# Patient Record
Sex: Female | Born: 1950 | Race: Black or African American | Hispanic: No | Smoking: Never smoker
Health system: Southern US, Community
[De-identification: ages and names within clinical notes are randomized; demographics above are authoritative.]

## PROBLEM LIST (undated history)

## (undated) DIAGNOSIS — I10 Essential (primary) hypertension: Secondary | ICD-10-CM

## (undated) DIAGNOSIS — C50919 Malignant neoplasm of unspecified site of unspecified female breast: Secondary | ICD-10-CM

## (undated) DIAGNOSIS — M25561 Pain in right knee: Secondary | ICD-10-CM

## (undated) DIAGNOSIS — M1990 Unspecified osteoarthritis, unspecified site: Secondary | ICD-10-CM

## (undated) DIAGNOSIS — E039 Hypothyroidism, unspecified: Secondary | ICD-10-CM

## (undated) DIAGNOSIS — R7303 Prediabetes: Secondary | ICD-10-CM

## (undated) HISTORY — PX: TONSILLECTOMY: SUR1361

---

## 1998-03-18 ENCOUNTER — Ambulatory Visit (HOSPITAL_COMMUNITY): Admission: RE | Admit: 1998-03-18 | Discharge: 1998-03-18 | Payer: Self-pay | Admitting: Family Medicine

## 2000-09-26 ENCOUNTER — Encounter: Payer: Self-pay | Admitting: Family Medicine

## 2000-09-26 ENCOUNTER — Ambulatory Visit (HOSPITAL_COMMUNITY): Admission: RE | Admit: 2000-09-26 | Discharge: 2000-09-26 | Payer: Self-pay | Admitting: Family Medicine

## 2001-01-12 ENCOUNTER — Ambulatory Visit (HOSPITAL_COMMUNITY): Admission: RE | Admit: 2001-01-12 | Discharge: 2001-01-12 | Payer: Self-pay | Admitting: *Deleted

## 2001-07-31 ENCOUNTER — Ambulatory Visit (HOSPITAL_COMMUNITY): Admission: RE | Admit: 2001-07-31 | Discharge: 2001-07-31 | Payer: Self-pay | Admitting: Family Medicine

## 2001-07-31 ENCOUNTER — Encounter: Payer: Self-pay | Admitting: Family Medicine

## 2002-01-18 ENCOUNTER — Ambulatory Visit (HOSPITAL_COMMUNITY): Admission: RE | Admit: 2002-01-18 | Discharge: 2002-01-18 | Payer: Self-pay | Admitting: Family Medicine

## 2002-01-25 ENCOUNTER — Ambulatory Visit (HOSPITAL_COMMUNITY): Admission: RE | Admit: 2002-01-25 | Discharge: 2002-01-25 | Payer: Self-pay | Admitting: Family Medicine

## 2002-01-25 ENCOUNTER — Encounter: Payer: Self-pay | Admitting: Family Medicine

## 2002-04-10 ENCOUNTER — Other Ambulatory Visit: Admission: RE | Admit: 2002-04-10 | Discharge: 2002-04-10 | Payer: Self-pay | Admitting: Obstetrics & Gynecology

## 2002-04-26 ENCOUNTER — Ambulatory Visit (HOSPITAL_COMMUNITY): Admission: RE | Admit: 2002-04-26 | Discharge: 2002-04-26 | Payer: Self-pay | Admitting: Family Medicine

## 2002-04-26 ENCOUNTER — Encounter: Payer: Self-pay | Admitting: Family Medicine

## 2003-04-22 ENCOUNTER — Other Ambulatory Visit: Admission: RE | Admit: 2003-04-22 | Discharge: 2003-04-22 | Payer: Self-pay | Admitting: Obstetrics & Gynecology

## 2003-05-04 ENCOUNTER — Emergency Department (HOSPITAL_COMMUNITY): Admission: EM | Admit: 2003-05-04 | Discharge: 2003-05-04 | Payer: Self-pay | Admitting: Emergency Medicine

## 2003-05-04 ENCOUNTER — Encounter: Payer: Self-pay | Admitting: Emergency Medicine

## 2004-01-15 ENCOUNTER — Emergency Department (HOSPITAL_COMMUNITY): Admission: EM | Admit: 2004-01-15 | Discharge: 2004-01-15 | Payer: Self-pay | Admitting: Emergency Medicine

## 2004-08-24 ENCOUNTER — Other Ambulatory Visit: Admission: RE | Admit: 2004-08-24 | Discharge: 2004-08-24 | Payer: Self-pay | Admitting: Obstetrics & Gynecology

## 2005-08-24 ENCOUNTER — Other Ambulatory Visit: Admission: RE | Admit: 2005-08-24 | Discharge: 2005-08-24 | Payer: Self-pay | Admitting: Obstetrics & Gynecology

## 2005-10-13 ENCOUNTER — Encounter: Admission: RE | Admit: 2005-10-13 | Discharge: 2005-10-13 | Payer: Self-pay | Admitting: Cardiology

## 2005-10-31 ENCOUNTER — Ambulatory Visit: Payer: Self-pay | Admitting: Internal Medicine

## 2005-11-14 ENCOUNTER — Ambulatory Visit: Payer: Self-pay | Admitting: Internal Medicine

## 2005-11-28 ENCOUNTER — Encounter: Admission: RE | Admit: 2005-11-28 | Discharge: 2005-11-28 | Payer: Self-pay | Admitting: Cardiology

## 2005-12-13 ENCOUNTER — Ambulatory Visit: Payer: Self-pay | Admitting: Internal Medicine

## 2006-01-03 ENCOUNTER — Ambulatory Visit: Payer: Self-pay | Admitting: Internal Medicine

## 2006-02-14 ENCOUNTER — Ambulatory Visit: Payer: Self-pay | Admitting: Internal Medicine

## 2008-02-25 ENCOUNTER — Encounter (INDEPENDENT_AMBULATORY_CARE_PROVIDER_SITE_OTHER): Payer: Self-pay | Admitting: *Deleted

## 2008-12-19 ENCOUNTER — Encounter: Admission: RE | Admit: 2008-12-19 | Discharge: 2008-12-19 | Payer: Self-pay | Admitting: Family Medicine

## 2009-01-07 ENCOUNTER — Ambulatory Visit (HOSPITAL_BASED_OUTPATIENT_CLINIC_OR_DEPARTMENT_OTHER): Admission: RE | Admit: 2009-01-07 | Discharge: 2009-01-07 | Payer: Self-pay | Admitting: Orthopedic Surgery

## 2009-11-10 ENCOUNTER — Encounter: Admission: RE | Admit: 2009-11-10 | Discharge: 2009-11-10 | Payer: Self-pay | Admitting: Cardiology

## 2009-11-12 ENCOUNTER — Encounter: Admission: RE | Admit: 2009-11-12 | Discharge: 2009-11-12 | Payer: Self-pay | Admitting: Cardiology

## 2010-01-22 IMAGING — US US RENAL
1 series · 14 of 25 positions shown · non-contrast
Comparison: None

CLINICAL DATA: Right flank and back pain

RENAL/URINARY TRACT ULTRASOUND COMPLETE

[Series 1: us renal · 0.30mm/px · 14 of 33 slices shown]
[im 1/33]
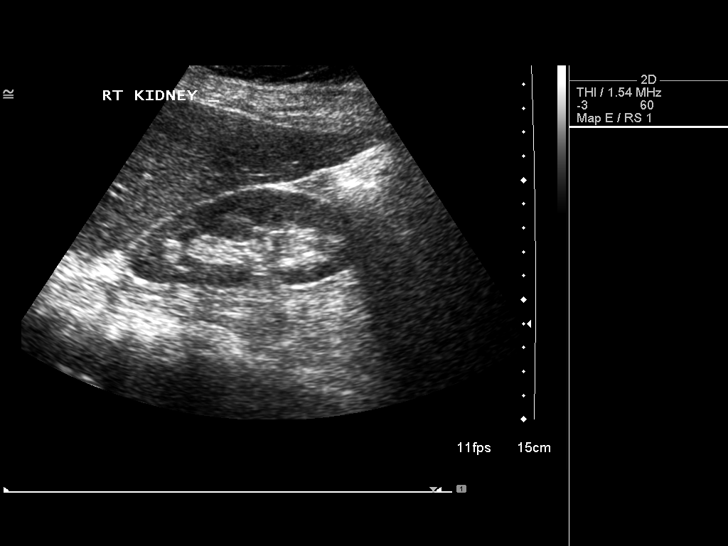
[im 3/33]
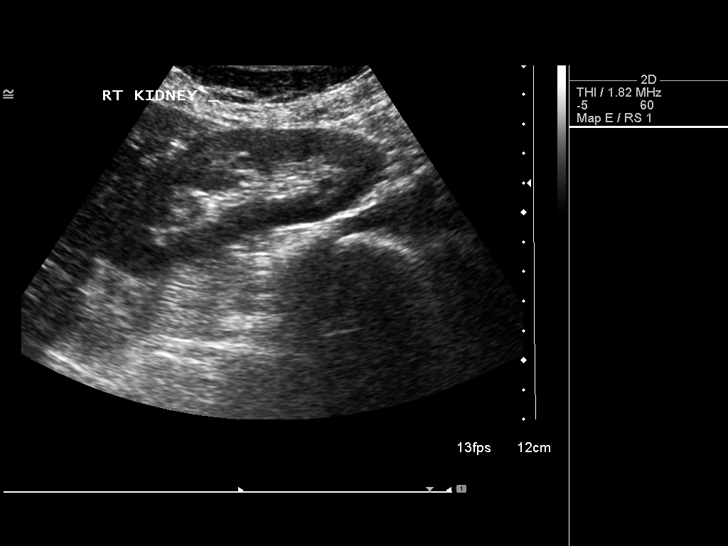
[im 6/33]
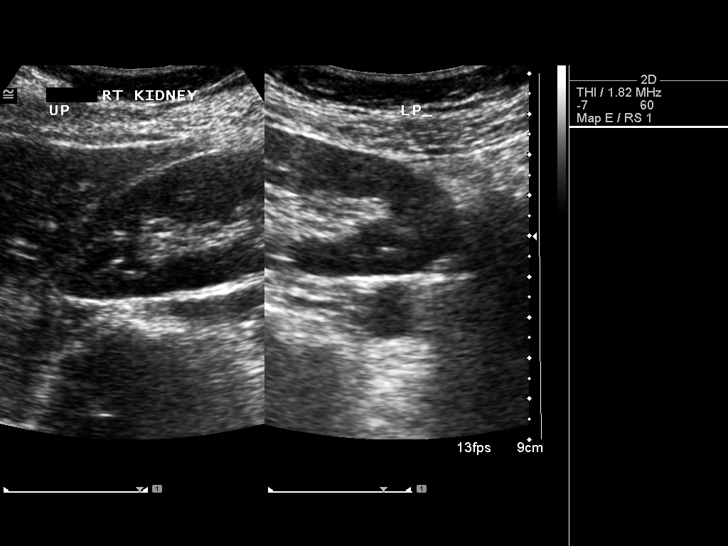
[im 9/33]
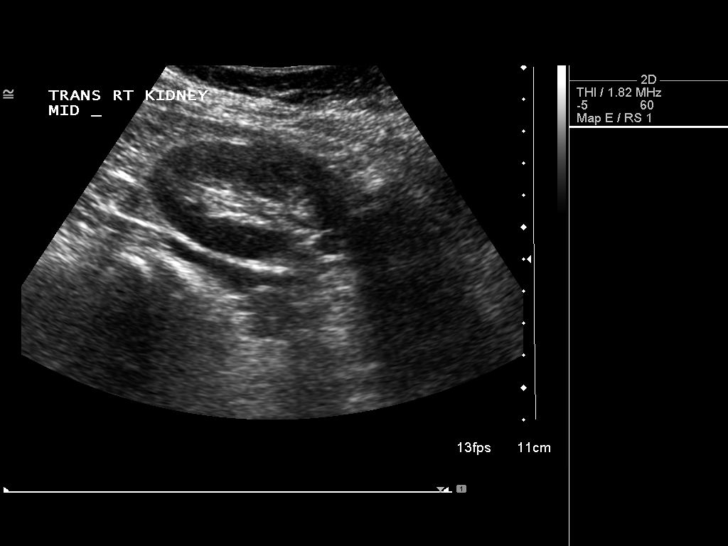
[im 11/33]
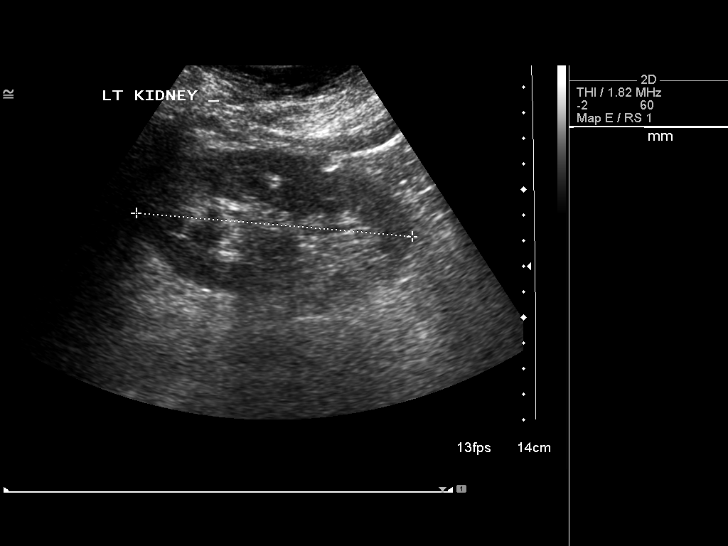
[im 13/33]
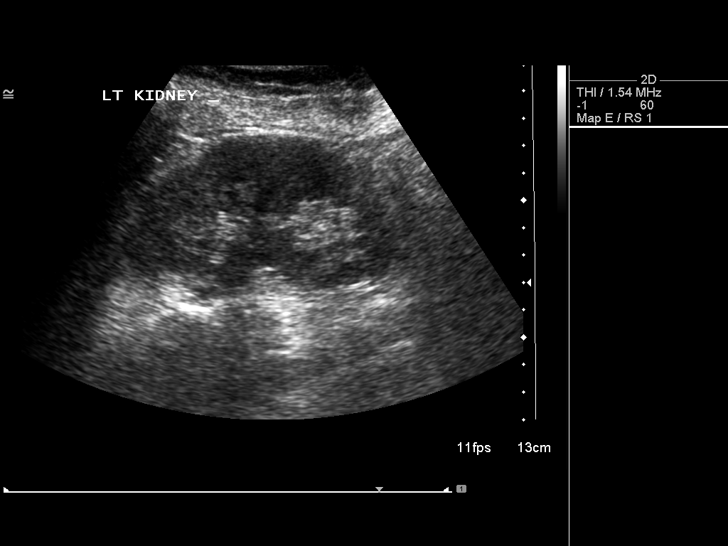
[im 15/33]
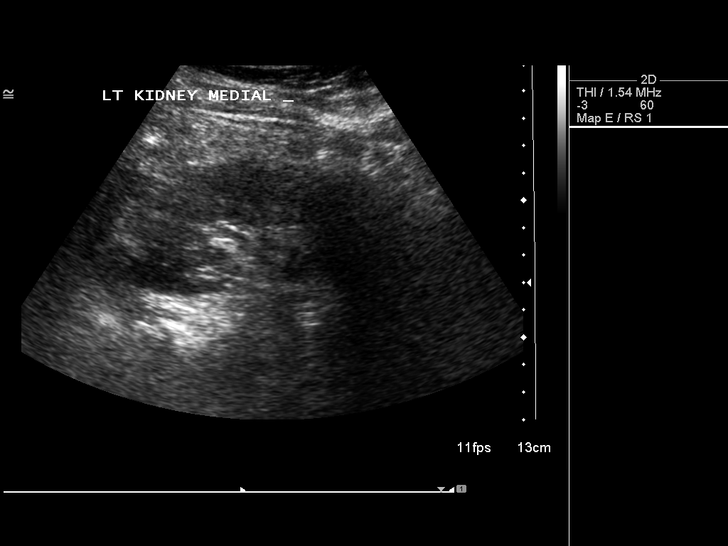
[im 18/33]
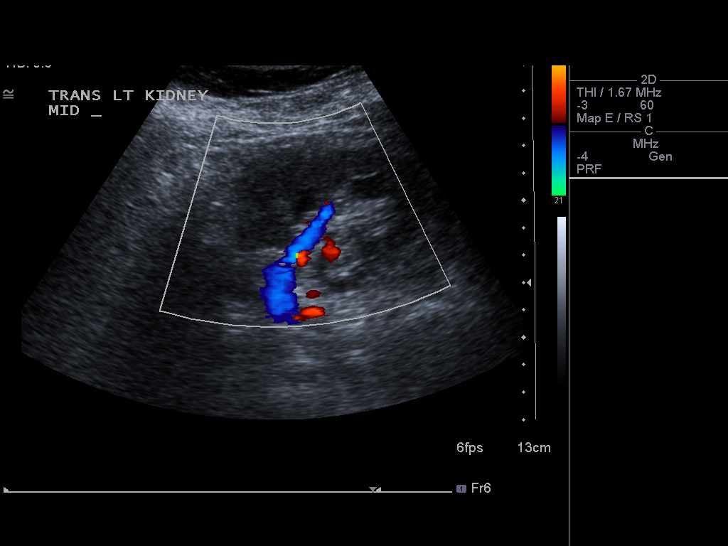
[im 21/33]
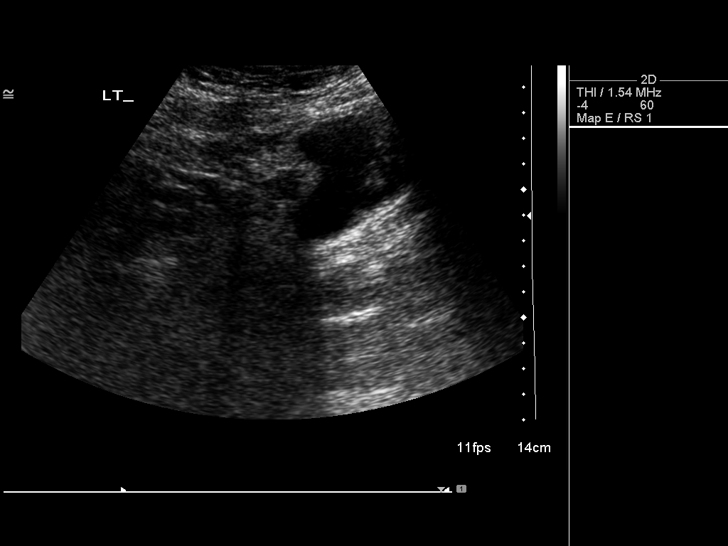
[im 22/33]
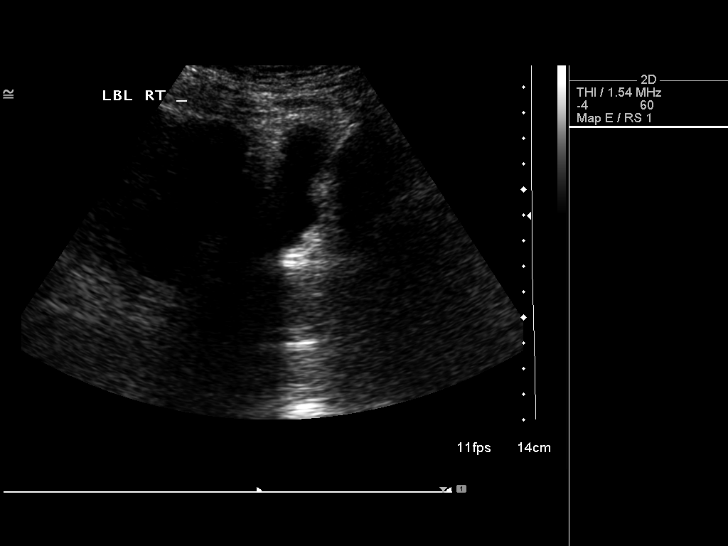
[im 25/33]
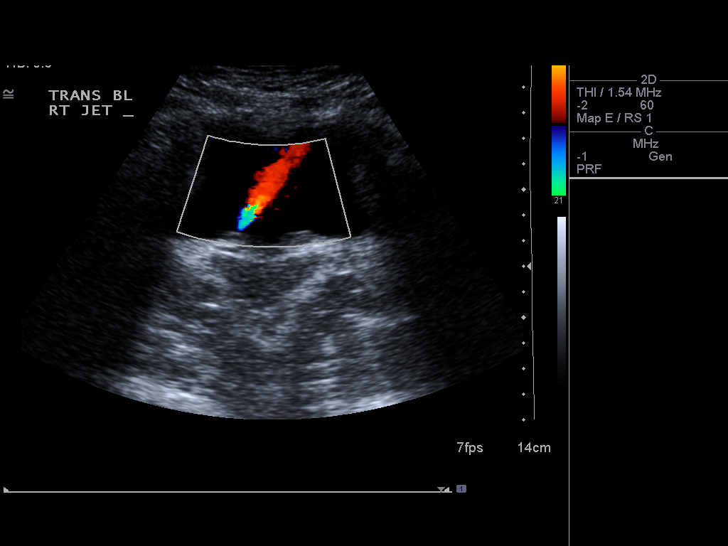
[im 27/33]
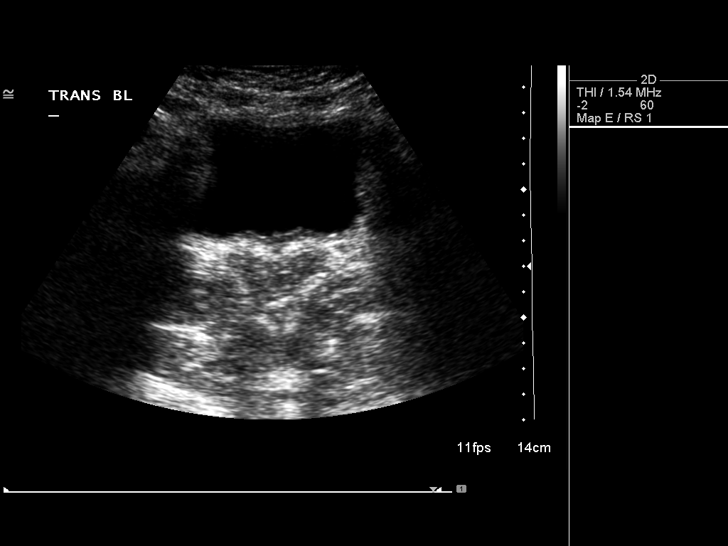
[im 30/33]
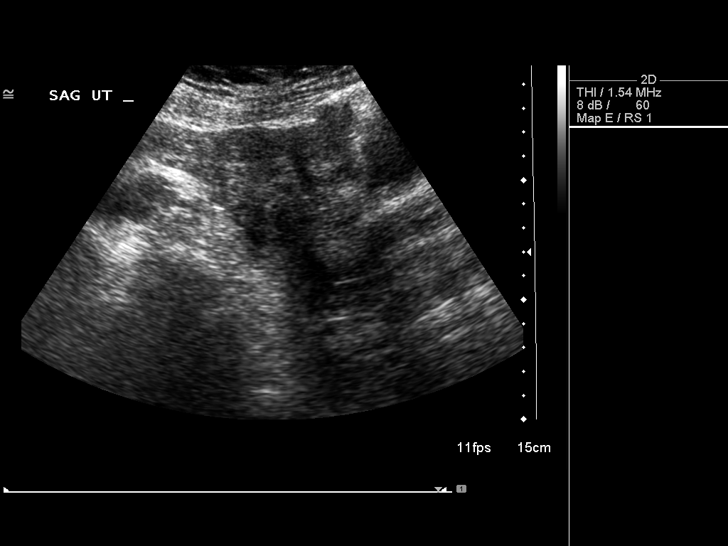
[im 33/33]
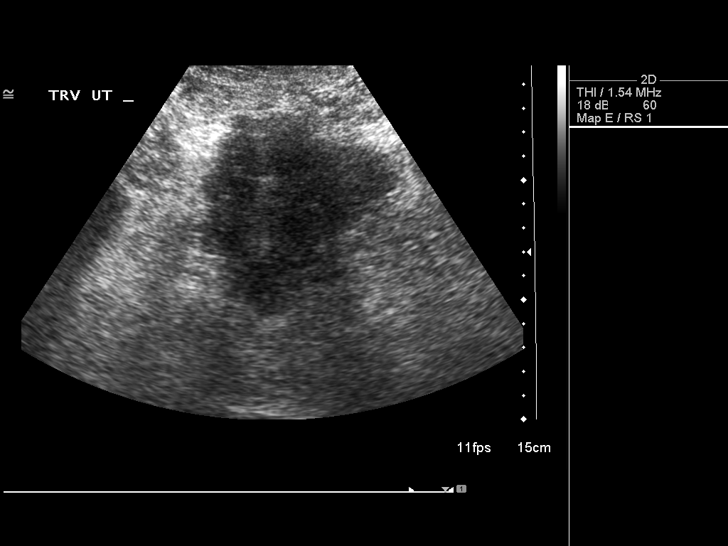

[14 of 25 positions shown; findings below may reference images not displayed]

FINDINGS: Right Kidney:  No hydronephrosis is seen.  The right kidney
measures 9.9 cm sagittally.

Left Kidney:  No hydronephrosis.  The left kidney measures 10.8 cm
sagittally.

Bladder:  The urinary bladder is not distended.  However bilateral
ureteral jets are noted.
The uterus is somewhat lobular measuring 12 x 7 x 10 cm.  This may
represent uterine fibroids.
IMPRESSION: 1.  No hydronephrosis.  No renal lesion.
2.  The urinary bladder is not well distended but bilateral
ureteral jets are noted.
3.  Somewhat lobular and inhomogeneous uterus.

## 2011-01-02 ENCOUNTER — Encounter: Payer: Self-pay | Admitting: Orthopedic Surgery

## 2011-03-28 LAB — POCT HEMOGLOBIN-HEMACUE: Hemoglobin: 12.1 g/dL (ref 12.0–15.0)

## 2011-04-26 NOTE — Op Note (Signed)
Kayla Miles, Kayla Miles                ACCOUNT NO.:  [id number]   MEDICAL RECORD NO.:  [id number]          PATIENT TYPE:  AMB   LOCATION:  DSC                          FACILITY:  [facility]   PHYSICIAN:  Mila Homer. Sherlean Foot, M.D. DATE OF BIRTH:  June 07, 1951   DATE OF PROCEDURE:  01/07/2009  DATE OF DISCHARGE:                               OPERATIVE REPORT   SURGEON:  Mila Homer. Sherlean Foot, MD   ASSISTANT:  None.   ANESTHESIA:  General.   PREOPERATIVE DIAGNOSIS:  Left knee osteoarthritis and meniscus tear.   POSTOPERATIVE DIAGNOSIS:  Left knee osteoarthritis and meniscus tear.   PROCEDURE:  Left knee arthroscopy, partial medial meniscectomy, and  chondroplasty in the medial patellofemoral compartments.   INDICATIONS FOR PROCEDURE:  The patient is a 59 year old black female  with mechanical symptoms.  Informed consent was obtained.   DESCRIPTION OF PROCEDURE:  The patient was laid supine, administered  general anesthesia.  Left leg was prepped and draped in the usual  sterile fashion.  Inferolateral and inferomedial portals were created  with #11 blade, blunt trocar, and cannula.  Diagnostic arthroscopy  revealed grade 3 and some small areas of grade 4 chondromalacia in the  patellofemoral joint.  Chondroplasty was performed with 3.2 [place]  shaver.  Then went into the notch, the ACL and PCL were normal.  I then  went into the medial compartment and there was grade 3 and some very  small areas of grade 4 chondromalacia on the femoral condyle.  Chondroplasty was performed.  Grade 2 and 3 chondromalacia on the tibial  plateau.  Chondroplasty was performed.  I then evaluated the medial  meniscus and there was a lot of complex tearing of the posterior half of  the meniscus.  I then performed a partial posterior horn medial  meniscectomy with straight upbiting basket forceps and a Conservation officer, nature.  I then went in the lateral compartment in the figure-of-4  position and there was grade 2 and  3 chondromalacia in the lateral  tibial plateau.  Chondroplasty was performed.  I then lavaged and closed  with 4-0 nylon sutures, infiltrated with 10 mL of morphine, Marcaine  mixture in both portals and then dressed with Xeroform dressing sponges,  sterile [place], and Ace wrap.   COMPLICATIONS:  None.   DRAINS:  None.          ______________________________  Mila Homer. Sherlean Foot, M.D.    SDL/MEDQ  D:  01/07/2009  T:  01/07/2009  Job:  [id number]

## 2011-04-29 NOTE — Procedures (Signed)
[facility]  Patient:    Kayla Miles, Kayla Miles                 MRN: [id number] Proc. Date: 01/12/01 Attending:  Anselmo Rod, M.D. CC:         Arvella Merles, M.D.   Procedure Report  DATE OF BIRTH:  May 11, 1951  REFERRING PHYSICIAN:  Arvella Merles, M.D.  PROCEDURE PERFORMED:  Colonoscopy.  ENDOSCOPIST:  Anselmo Rod, M.D.  INSTRUMENT USED:  Olympus video colonoscope.  INDICATIONS FOR PROCEDURE:  The patient is a  60 year old black female with a history of colon cancer in her father who died at 32.  Rule out colonic polyps, masses, hemorrhoids, etc.  PREPROCEDURE PREPARATION:  Informed consent was procured from the patient. The patient was fasted for eight hours prior to the procedure and prepped with a bottle of magnesium citrate and a gallon of NuLytely the night prior to the procedure.  PREPROCEDURE PHYSICAL:  The patient had stable vital signs.  Neck supple. Chest clear to auscultation.  S1, S2 regular.  Abdomen soft with normal abdominal bowel sounds.  DESCRIPTION OF PROCEDURE:  The patient was placed in the left lateral decubitus position and sedated with 50 mg of Demerol and 5 mg of Versed intravenously.  Once the patient was adequately sedated and maintained on low-flow oxygen and continuous cardiac monitoring, the Olympus video colonoscope was advanced from the rectum to the cecum without difficulty.  The entire colonic mucosa appeared healthy except for changes consistent with melanosis coli more prominent on the right side of the colon than the left side.  No erosions, ulcerations, masses, polyps, or diverticulosis were seen.  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  RECOMMENDATIONS:  Repeat colorectal cancer screening is recommended in the next five years or earlier if the  patient were to develop any abnormal symptoms.DD:  01/12/01 TD:  01/15/01 Job: [id number]

## 2013-12-12 HISTORY — PX: MASTECTOMY: SHX3

## 2014-03-26 DIAGNOSIS — C50919 Malignant neoplasm of unspecified site of unspecified female breast: Secondary | ICD-10-CM

## 2014-03-26 HISTORY — DX: Malignant neoplasm of unspecified site of unspecified female breast: C50.919

## 2014-04-02 ENCOUNTER — Other Ambulatory Visit: Payer: Self-pay | Admitting: Surgery

## 2014-04-02 DIAGNOSIS — C50919 Malignant neoplasm of unspecified site of unspecified female breast: Secondary | ICD-10-CM

## 2014-04-07 ENCOUNTER — Other Ambulatory Visit: Payer: Self-pay

## 2014-04-07 ENCOUNTER — Ambulatory Visit
Admission: RE | Admit: 2014-04-07 | Discharge: 2014-04-07 | Disposition: A | Discharge status: Home / Self Care | Payer: No Typology Code available for payment source | Source: Ambulatory Visit | Attending: Surgery | Admitting: Surgery

## 2014-04-07 DIAGNOSIS — C50919 Malignant neoplasm of unspecified site of unspecified female breast: Secondary | ICD-10-CM

## 2014-04-07 MED ORDER — GADOBENATE DIMEGLUMINE 529 MG/ML IV SOLN
15.0000 mL | Freq: Once | INTRAVENOUS | Status: AC | PRN
  Administered 2014-04-07: 15 mL via INTRAVENOUS

## 2014-04-08 ENCOUNTER — Other Ambulatory Visit: Payer: Self-pay

## 2014-04-15 ENCOUNTER — Encounter: Payer: Self-pay | Admitting: Radiation Oncology

## 2014-04-15 NOTE — Progress Notes (Addendum)
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  Receptor Status: ER(75%), PR (=/>1%), Her2-neu (cerb-2)   Found on screening mammography?: 03/20/14 - New 1 cm developing density in the left breast at 12:00 region.  Spot magnification views demonstrates lobulated indistinct margins as well as microcalcifications.  There are afew other scattered calcifications anterior to it,.  There is no adenopathy in the left breast.  Breast ultrasound - sonographically left breast lesion at 12:00 represents  An irregular hypoechoic lesion with angular margins measuring  9/6 mm.  No posterior shadowing is seen. No internal flow is noted.  Left axillary nodes are normal/  Past/Anticipated interventions by surgeon, if NGF:REVQWQ of left Breast.  Lumpectomy scheduled on May 29th, 2015  Past/Anticipated interventions by medical oncology, if any: Chemotherapy Unknown  Lymphedema issues, if any:   Pain issues, if any:    SAFETY ISSUES:  Prior radiation? NO  Pacemaker/ICD?No  Possible current pregnancy?NO       Is the patient on methotrexate? No  Current Complaints / other details  Menarche age 59, Parity age 47 G 3,P3, BC x 7 years, Menopause in her 75's, NO  HRT

## 2014-04-16 ENCOUNTER — Ambulatory Visit
Admission: RE | Admit: 2014-04-16 | Discharge: 2014-04-16 | Disposition: A | Discharge status: Home / Self Care | Payer: Non-veteran care | Source: Ambulatory Visit | Attending: Radiation Oncology | Admitting: Radiation Oncology

## 2014-04-16 ENCOUNTER — Other Ambulatory Visit: Payer: Self-pay | Admitting: Radiation Oncology

## 2014-04-16 ENCOUNTER — Encounter: Payer: Self-pay | Admitting: Radiation Oncology

## 2014-04-16 VITALS — BP 124/79 | HR 76 | Temp 98.1°F | Ht 64.5 in | Wt 178.5 lb

## 2014-04-16 DIAGNOSIS — I10 Essential (primary) hypertension: Secondary | ICD-10-CM | POA: Insufficient documentation

## 2014-04-16 DIAGNOSIS — C50119 Malignant neoplasm of central portion of unspecified female breast: Secondary | ICD-10-CM | POA: Insufficient documentation

## 2014-04-16 DIAGNOSIS — C50112 Malignant neoplasm of central portion of left female breast: Secondary | ICD-10-CM

## 2014-04-16 DIAGNOSIS — Z79899 Other long term (current) drug therapy: Secondary | ICD-10-CM | POA: Insufficient documentation

## 2014-04-16 DIAGNOSIS — E039 Hypothyroidism, unspecified: Secondary | ICD-10-CM | POA: Insufficient documentation

## 2014-04-16 HISTORY — DX: Hypothyroidism, unspecified: E03.9

## 2014-04-16 HISTORY — DX: Pain in right knee: M25.561

## 2014-04-16 HISTORY — DX: Prediabetes: R73.03

## 2014-04-16 HISTORY — DX: Essential (primary) hypertension: I10

## 2014-04-16 HISTORY — DX: Malignant neoplasm of unspecified site of unspecified female breast: C50.919

## 2014-04-16 NOTE — Progress Notes (Signed)
Radiation Oncology         [phone]) [phone] ________________________________  Initial outpatient Consultation  Name: Kayla Miles MRN: [id number]  Date: 04/16/2014  DOB: 03-25-1951    REFERRING PHYSICIAN: Stacie Glaze, MD  DIAGNOSIS: Stage IA, clinical T1bN0M0, Left Breast Grade 2 IDC,  ER >75%, PR negative, HER2 neu >75%.  HISTORY OF PRESENT ILLNESS::Kayla Miles is a 63 y.o. female who underwent a 6 month followup mammogram on 03/20/2014 at the [facility] to survey her breasts. The right breast demonstrated a stable appearance of intramammary lymph node in the extreme right lateral breast. The right breast is otherwise unremarkable. However, in the left breast, a 1 cm developing density was appreciated at 12:00. On spot magnification views it demonstrated lobulated indistinct margins as well as microcalcifications. There were a few other scattered calcifications anterior to it. There was no adenopathy in the left breast. [place] measurements were 9x20m.  Ultrasound-guided Biopsy on 03-24-14 revealed Grade 2 IDC with In situ disease present as well as heavy lymphocytic infiltrate possibly representing an intramammary node.  ER >75%, PR negative, HER2 neu >75%.  MRI of breasts performed in [place] [facility] revealed:   In the left breast 12 o'clock location, there is a lobulated mass with associated clip artifact measuring 8 x 5 x 4 mm... This corresponds to the recently biopsy proven malignancy. .Marland KitchenMarland KitchenMarland Kitchenymph nodes: No abnormal appearing lymph nodes. ...Marland KitchenMarland Kitcheno MRI  evidence for multi focal/ multicentric or contralateral malignancy.  Lumpectomy is scheduled on May 29th, 2015. She has not yet seen a medical oncologist.  Menarche age 63 Parity age [age]G3,P3, BC x 7 years, Menopause in her 574's NO HRT   She is in her usual state of health. She recently had a meniscal injury in her knee while bowling. She is otherwise very active. She reports that she is a healthy diet. She reports trouble accepting  her diagnosis and is feeling to frustration related to her diagnosis after living a healthy lifestyle.   PREVIOUS RADIATION THERAPY: No  PAST MEDICAL HISTORY:  has a past medical history of Breast cancer (03/26/14); HTN (hypertension); Hypothyroidism; Arthralgia of knee, right; and Pre-diabetes.    PAST SURGICAL HISTORY:No past surgical history on file.  FAMILY HISTORY: family history includes Colon cancer in her paternal uncle; Colon cancer (age of onset: 552 in her father. she denies a history of breast cancer in her family  SOCIAL HISTORY:  reports that she has never smoked. She does not have any smokeless tobacco history on file.  ALLERGIES: Codeine  MEDICATIONS:  Current Outpatient Prescriptions  Medication Sig Dispense Refill  . AMLODIPINE BESYLATE PO Take 10 mg by mouth daily. Take 1/2 tab by mouth daily      . calcium acetate (PHOSLO) 667 MG capsule Take 169 mg by mouth daily. 1 capsule daily by mouth      . Cholecalciferol 2000 UNITS CAPS Take 2,000 Units by mouth daily.      .Marland KitchenLEVOTHYROXINE SODIUM PO Take 0.088 mg by mouth daily. Take one tablet daily by mouth before breakfast       No current facility-administered medications for this encounter.    REVIEW OF SYSTEMS:  Notable for that above.   PHYSICAL EXAM:  height is 5' 4.5" (1.638 m) and weight is 178 lb 8 oz (80.967 kg). Her temperature is 98.1 F (36.7 C). Her blood pressure is 124/79 and her pulse is 76.   General: Alert and oriented, in no acute distress HEENT: Head is  normocephalic.  Extraocular movements are intact. Oropharynx is clear. Neck: Neck is supple, no palpable cervical or supraclavicular lymphadenopathy. Heart: Regular in rate and rhythm with no murmurs, rubs, or gallops. Chest: Clear to auscultation bilaterally, with no rhonchi, wheezes, or rales. Abdomen: Soft, nontender, nondistended, with no rigidity or guarding. Extremities: No cyanosis or edema. Lymphatics: No concerning lymphadenopathy. Skin:  No concerning lesions. Neurologic: Cranial nerves II through XII are grossly intact. No obvious focalities. Speech is fluent. Coordination is intact. Psychiatric:   Affect- tearful BREASTS: 1 cm nodule appreciated in 12:00 position of Left breast, otherwise breasts are unremarkable and axillary regions negative for palpable disease.   ECOG = 0 0 - Asymptomatic (Fully active, able to carry on all predisease activities without restriction)  1 - Symptomatic but completely ambulatory (Restricted in physically strenuous activity but ambulatory and able to carry out work of a light or sedentary nature. For example, light housework, office work)  2 - Symptomatic, <50% in bed during the day (Ambulatory and capable of all self care but unable to carry out any work activities. Up and about more than 50% of waking hours)  3 - Symptomatic, >50% in bed, but not bedbound (Capable of only limited self-care, confined to bed or chair 50% or more of waking hours)  4 - Bedbound (Completely disabled. Cannot carry on any self-care. Totally confined to bed or chair)  5 - Death   Eustace Pen MM, Creech RH, Tormey DC, et al. [phone]). "Toxicity and response criteria of the [facility]". [place] Oncol. 5 (6): 649-55   LABORATORY DATA:  Lab Results  Component Value Date   HGB 12.1 01/07/2009   CMP  No results found for this basename: na,  k,  cl,  co2,  glucose,  bun,  creatinine,  calcium,  prot,  albumin,  ast,  alt,  alkphos,  bilitot,  gfrnonaa,  gfraa         RADIOGRAPHY: Mr Breast Bilateral W Wo Contrast  04/08/2014   CLINICAL DATA:  Recently diagnosed left breast 12 o'clock location invasive ductal carcinoma. Two clips were placed at a single biopsy site at the time of recent ultrasound guided core biopsy performed at the [facility].  LABS:  Not obtained  EXAM: BILATERAL BREAST MRI WITH AND WITHOUT CONTRAST  TECHNIQUE: Multiplanar, multisequence MR images of both breasts  were obtained prior to and following the intravenous administration of 25m of MultiHance.  THREE-DIMENSIONAL MR IMAGE RENDERING ON INDEPENDENT WORKSTATION:  Three-dimensional MR images were rendered by post-processing of the original MR data on an independent workstation. The three-dimensional MR images were interpreted, and findings are reported in the following complete MRI report for this study. Three dimensional images were evaluated at the independent DynaCad workstation  COMPARISON:  Outside Previous exams  FINDINGS: Breast composition: b.  Scattered fibroglandular tissue.  Background parenchymal enhancement: Mild  Right breast: No mass or abnormal enhancement.  Left breast: In the left breast 12 o'clock location, there is a lobulated mass with associated clip artifact measuring 8 x 5 x 4 mm, demonstrating plateau type enhancement kinetics. This corresponds to the recently biopsy proven malignancy.  Lymph nodes: No abnormal appearing lymph nodes.  Ancillary findings: In the liver, scattered T2 hyperintense subcentimeter presumed biliary hamartomas or cysts are incidentally noted.  IMPRESSION: Solitary abnormally enhancing left breast mass 12 o'clock location, corresponding to the biopsy-proven invasive ductal carcinoma. No MRI evidence for multi focal/ multicentric or contralateral malignancy.  RECOMMENDATION: Treatment plan  BI-RADS CATEGORY  6: Known biopsy-proven malignancy.   Electronically Signed   By: Conchita Paris M.D.   On: 04/08/2014 14:55      IMPRESSION/PLAN: This is a lovely 63 year-old woman with stage I left breast cancer as described above.   It appears that she would be a good candidate for breast conservation. I talked to her about the option of a mastectomy and informed her that her expected overall survival would be equivalent between mastectomy and breast conservation, based upon randomized controlled data. She is enthusiastic about breast conservation.   It was a pleasure  meeting the patient today. We discussed the risks, benefits, and side effects of adjuvant radiotherapy. We discussed that radiation would take approximately 6 weeks to complete and that I would give the patient a few weeks to heal following surgery before starting treatment planning. If she is a candidate for chemotherapy, this would precede radiation and the goal would be to start radiotherapy within about a month of completing chemotherapy. We spoke about acute effects including skin irritation and fatigue as well as much less common late effects including lung and heart irritation. We spoke about the latest technology that is used to minimize the risk of late effects for breast cancer patients undergoing radiotherapy. No guarantees of treatment were given. The patient is enthusiastic about proceeding with treatment. I look forward to participating in the patient's care.   I would recommend a medical oncology consultation to rule in or rule out chemotherapy.  Per NCCN guidelines, for T1bN0 ER+ Her2+ disease, adjuvant endocrine therapy +/- chemotherapy with trastuzumab should be considered.  I will defer to Dr. Laurance Flatten for this referral as she is a [facility] patient and I am not under the impression she can see a physician at [facility].  I will refer her to social work as she demonstrates significant distress related to her diagnosis and I hope this will help her cope with her difficult situation.  I gave the patient my contact information. I would appreciate if she is referred back to me by the [facility] when ready for treatment planning. It will take about a week to generate a radiation treatment plan after she is seen in my clinic. Should anyone need to reach me, my clinic phone number is [phone] or alternatively [phone]. __________________________________________   Eppie Gibson, MD

## 2014-04-16 NOTE — Addendum Note (Signed)
Encounter addended by: Deirdre Evener, RN on: 04/16/2014  7:29 PM<BR>     Documentation filed: Charges VN

## 2014-04-16 NOTE — Addendum Note (Signed)
Encounter addended by: Deirdre Evener, RN on: 04/16/2014  8:22 PM<BR>     Documentation filed: Arn Medal VN

## 2014-04-21 ENCOUNTER — Inpatient Hospital Stay
Admission: RE | Admit: 2014-04-21 | Discharge: 2014-04-21 | Disposition: A | Discharge status: Home / Self Care | Payer: Self-pay | Source: Ambulatory Visit | Attending: Radiation Oncology | Admitting: Radiation Oncology

## 2014-04-21 ENCOUNTER — Other Ambulatory Visit: Payer: Self-pay | Admitting: Radiation Oncology

## 2014-04-21 DIAGNOSIS — C50912 Malignant neoplasm of unspecified site of left female breast: Secondary | ICD-10-CM

## 2014-06-16 ENCOUNTER — Encounter (HOSPITAL_COMMUNITY): Payer: Self-pay | Admitting: Emergency Medicine

## 2014-06-16 ENCOUNTER — Emergency Department (HOSPITAL_COMMUNITY)
Admission: EM | Admit: 2014-06-16 | Discharge: 2014-06-16 | Disposition: A | Discharge status: Home / Self Care | Payer: Non-veteran care | Attending: Emergency Medicine | Admitting: Emergency Medicine

## 2014-06-16 DIAGNOSIS — N6489 Other specified disorders of breast: Secondary | ICD-10-CM

## 2014-06-16 DIAGNOSIS — Z853 Personal history of malignant neoplasm of breast: Secondary | ICD-10-CM | POA: Insufficient documentation

## 2014-06-16 DIAGNOSIS — Z79899 Other long term (current) drug therapy: Secondary | ICD-10-CM | POA: Insufficient documentation

## 2014-06-16 DIAGNOSIS — E039 Hypothyroidism, unspecified: Secondary | ICD-10-CM | POA: Insufficient documentation

## 2014-06-16 DIAGNOSIS — I10 Essential (primary) hypertension: Secondary | ICD-10-CM | POA: Insufficient documentation

## 2014-06-16 DIAGNOSIS — N63 Unspecified lump in unspecified breast: Secondary | ICD-10-CM | POA: Insufficient documentation

## 2014-06-16 MED ORDER — IBUPROFEN 400 MG PO TABS: 400.0000 mg | ORAL_TABLET | Freq: Four times a day (QID) | ORAL | Status: DC | PRN

## 2014-06-16 NOTE — ED Notes (Signed)
The pt is c/o her lt breast swelling and feeling hard since she had a breast biopsy June 26.  She has had a breast biopsy also on the same breast June 3rd. Only sl pain

## 2014-06-16 NOTE — Discharge Instructions (Signed)
No evidence of infection on our exam. Please ice the area, take motrin for swelling and pain, see the surgeon as planned. We are not sure what is causing the swelling - but it is not infectious process.

## 2014-06-16 NOTE — ED Provider Notes (Signed)
CSN: [id number]     Arrival date & time 06/16/14  0007 History   First MD Initiated Contact with Patient 06/16/14 0259     Chief Complaint  Patient presents with  . Breast Problem     (Consider location/radiation/quality/duration/timing/severity/associated sxs/prior Treatment) HPI Comments: Pt with hx of left sided breast CA s/p lumpectomy x 2, pod # 9 from the last lumpectomy comes in with acute swelling to her left breast. No trauma, no pain, no drainage. The nurse was called, and pt was asked to come in to the ER.  The history is provided by the patient.    Past Medical History  Diagnosis Date  . Breast cancer 03/26/14    Left  . HTN (hypertension)   . Hypothyroidism   . Arthralgia of knee, right   . Pre-diabetes    History reviewed. No pertinent past surgical history. Family History  Problem Relation Age of Onset  . Colon cancer Father 55  . Colon cancer Paternal Uncle    History  Substance Use Topics  . Smoking status: Never Smoker   . Smokeless tobacco: Not on file  . Alcohol Use: No   OB History   Grav Para Term Preterm Abortions TAB SAB Ect Mult Living                 Obstetric Comments   Menarche age 20, Parity age 37 G 3,P3, BC x 7 years, Menopause in her 26's, NO  HRT     Review of Systems  Constitutional: Negative for fever and activity change.  Respiratory: Negative for shortness of breath.   Cardiovascular: Negative for chest pain.  Skin: Negative for rash.      Allergies  Codeine  Home Medications   Prior to Admission medications   Medication Sig Start Date End Date Taking? Authorizing Provider  amLODipine (NORVASC) 10 MG tablet Take 10 mg by mouth every other day.   Yes Historical Provider, MD  HYDROcodone-acetaminophen (NORCO/VICODIN) 5-325 MG per tablet Take 1 tablet by mouth every 6 (six) hours as needed for moderate pain.   Yes Historical Provider, MD  levothyroxine (SYNTHROID, LEVOTHROID) 88 MCG tablet Take 88 mcg by mouth daily before  breakfast.   Yes Historical Provider, MD  ibuprofen (ADVIL,MOTRIN) 400 MG tablet Take 1 tablet (400 mg total) by mouth every 6 (six) hours as needed. 06/16/14   Danasha Melman, MD   BP 163/94  Pulse 73  Temp(Src) 98.3 F (36.8 C) (Oral)  Resp 20  SpO2 99% Physical Exam  Nursing note and vitals reviewed. Constitutional: She appears well-developed.  Eyes: Conjunctivae are normal.  Neck: Neck supple.  Cardiovascular: Normal rate.   Pulmonary/Chest: Effort normal.  Skin:  Left breast  -there is underlying edema and swelling, and some hematoma. Pt has nodularity of the breast as well. No drainage, no tenderness, no erythema. No axillary lymphadenopathy.    ED Course  Procedures (including critical care time) Labs Review Labs Reviewed - No data to display  Imaging Review No results found.   EKG Interpretation None      MDM   Final diagnoses:  Edema of breast  Hematoma (nontraumatic) of breast    Pt with left breast pain. Has edema. Hard to tell what is post op, and what is new. No evidence of infection however, and no drainage and fortunately patient has f/u this afternoon. The area that is edematous has been demarcated, with RN chaperoning me. Ice provided. Stable for d/c.   Varney Biles, MD  06/16/14 0419 

## 2014-10-13 ENCOUNTER — Encounter (HOSPITAL_COMMUNITY): Payer: Self-pay | Admitting: Emergency Medicine

## 2015-03-20 ENCOUNTER — Other Ambulatory Visit (HOSPITAL_COMMUNITY): Payer: Self-pay | Admitting: Internal Medicine

## 2015-03-20 DIAGNOSIS — C50919 Malignant neoplasm of unspecified site of unspecified female breast: Secondary | ICD-10-CM

## 2015-03-31 ENCOUNTER — Ambulatory Visit (HOSPITAL_COMMUNITY): Admission: RE | Admit: 2015-03-31 | Payer: Non-veteran care | Source: Ambulatory Visit

## 2015-04-16 ENCOUNTER — Ambulatory Visit (HOSPITAL_COMMUNITY): Admission: RE | Admit: 2015-04-16 | Payer: Non-veteran care | Source: Ambulatory Visit

## 2015-05-13 ENCOUNTER — Encounter: Payer: Self-pay | Admitting: Cardiology

## 2015-05-28 ENCOUNTER — Ambulatory Visit (HOSPITAL_COMMUNITY)
Admission: RE | Admit: 2015-05-28 | Discharge: 2015-05-28 | Disposition: A | Discharge status: Home / Self Care | Payer: Non-veteran care | Source: Ambulatory Visit | Attending: Internal Medicine | Admitting: Internal Medicine

## 2015-05-28 DIAGNOSIS — C50919 Malignant neoplasm of unspecified site of unspecified female breast: Secondary | ICD-10-CM | POA: Diagnosis present

## 2015-05-28 LAB — POCT I-STAT CREATININE: Creatinine, Ser: 1 mg/dL (ref 0.44–1.00)

## 2015-06-11 ENCOUNTER — Encounter: Payer: Self-pay | Admitting: Cardiology

## 2015-06-29 ENCOUNTER — Other Ambulatory Visit (HOSPITAL_COMMUNITY): Payer: Non-veteran care

## 2015-07-17 ENCOUNTER — Ambulatory Visit (HOSPITAL_COMMUNITY)
Admission: RE | Admit: 2015-07-17 | Discharge: 2015-07-17 | Disposition: A | Discharge status: Home / Self Care | Payer: Non-veteran care | Source: Ambulatory Visit | Attending: Internal Medicine | Admitting: Internal Medicine

## 2015-07-17 ENCOUNTER — Inpatient Hospital Stay (HOSPITAL_COMMUNITY): Admission: RE | Admit: 2015-07-17 | Payer: Non-veteran care | Source: Ambulatory Visit

## 2015-07-17 DIAGNOSIS — C50919 Malignant neoplasm of unspecified site of unspecified female breast: Secondary | ICD-10-CM | POA: Insufficient documentation

## 2015-07-17 LAB — CREATININE, SERUM: Creatinine, Ser: 0.86 mg/dL (ref 0.44–1.00)

## 2015-08-06 ENCOUNTER — Other Ambulatory Visit: Payer: Self-pay | Admitting: Physician Assistant

## 2015-08-20 ENCOUNTER — Other Ambulatory Visit: Payer: Self-pay | Admitting: Physician Assistant

## 2015-08-25 ENCOUNTER — Other Ambulatory Visit (HOSPITAL_COMMUNITY): Payer: Self-pay | Admitting: Physician Assistant

## 2015-11-11 ENCOUNTER — Ambulatory Visit (HOSPITAL_COMMUNITY): Admission: RE | Admit: 2015-11-11 | Payer: Non-veteran care | Source: Ambulatory Visit

## 2015-11-23 ENCOUNTER — Telehealth: Payer: Self-pay | Admitting: Cardiology

## 2015-11-23 NOTE — Telephone Encounter (Signed)
Spoke w/pt about her expired auth from [place] for her cardiac MRI tomorrow, 11-24-15.  [facility] today and they are issuing another [facility]. Pt wants her test for tomorrow canceled until she receives authorization from [place].  [facility] to [facility] tomorrow to ensure they have my fax# to send new auth.  Gave pt both mine and my co-worker, Dorian Pod phone#s should she need anything further.

## 2015-11-24 ENCOUNTER — Ambulatory Visit (HOSPITAL_COMMUNITY): Admission: RE | Admit: 2015-11-24 | Payer: Non-veteran care | Source: Ambulatory Visit

## 2015-12-16 ENCOUNTER — Inpatient Hospital Stay (HOSPITAL_COMMUNITY): Admission: RE | Admit: 2015-12-16 | Payer: Non-veteran care | Source: Ambulatory Visit

## 2015-12-21 ENCOUNTER — Ambulatory Visit (HOSPITAL_COMMUNITY)
Admission: RE | Admit: 2015-12-21 | Discharge: 2015-12-21 | Disposition: A | Discharge status: Home / Self Care | Payer: Non-veteran care | Source: Ambulatory Visit | Attending: Internal Medicine | Admitting: Internal Medicine

## 2016-01-06 ENCOUNTER — Ambulatory Visit (HOSPITAL_COMMUNITY)
Admission: RE | Admit: 2016-01-06 | Discharge: 2016-01-06 | Disposition: A | Discharge status: Home / Self Care | Payer: No Typology Code available for payment source | Source: Ambulatory Visit | Attending: Internal Medicine | Admitting: Internal Medicine

## 2016-01-06 ENCOUNTER — Other Ambulatory Visit (HOSPITAL_COMMUNITY): Payer: Self-pay | Admitting: Internal Medicine

## 2016-01-06 DIAGNOSIS — C50919 Malignant neoplasm of unspecified site of unspecified female breast: Secondary | ICD-10-CM | POA: Insufficient documentation

## 2016-01-06 DIAGNOSIS — I071 Rheumatic tricuspid insufficiency: Secondary | ICD-10-CM | POA: Diagnosis not present

## 2016-01-06 DIAGNOSIS — Z9221 Personal history of antineoplastic chemotherapy: Secondary | ICD-10-CM | POA: Diagnosis present

## 2016-01-06 DIAGNOSIS — I517 Cardiomegaly: Secondary | ICD-10-CM | POA: Diagnosis not present

## 2016-01-06 DIAGNOSIS — I340 Nonrheumatic mitral (valve) insufficiency: Secondary | ICD-10-CM | POA: Diagnosis not present

## 2016-01-06 LAB — POCT I-STAT CREATININE: CREATININE: 1 mg/dL (ref 0.44–1.00)

## 2016-01-07 DIAGNOSIS — C50919 Malignant neoplasm of unspecified site of unspecified female breast: Secondary | ICD-10-CM | POA: Diagnosis not present

## 2016-12-13 ENCOUNTER — Ambulatory Visit: Payer: Non-veteran care | Attending: Surgery | Admitting: Physical Therapy

## 2016-12-13 DIAGNOSIS — M25612 Stiffness of left shoulder, not elsewhere classified: Secondary | ICD-10-CM | POA: Insufficient documentation

## 2016-12-13 DIAGNOSIS — R293 Abnormal posture: Secondary | ICD-10-CM | POA: Insufficient documentation

## 2016-12-22 ENCOUNTER — Ambulatory Visit: Payer: Non-veteran care | Admitting: Physical Therapy

## 2016-12-22 DIAGNOSIS — M25612 Stiffness of left shoulder, not elsewhere classified: Secondary | ICD-10-CM | POA: Diagnosis present

## 2016-12-22 DIAGNOSIS — R293 Abnormal posture: Secondary | ICD-10-CM

## 2016-12-22 NOTE — Therapy (Signed)
[place], [place], [address] Phone: [phone]   Fax:  [phone]  Physical Therapy Evaluation  Patient Details  Name: Kayla Miles MRN: [id number] Date of Birth: 1951/09/07 Referring Provider: Dr. Stacie Glaze  Encounter Date: 12/22/2016      PT End of Session - 12/22/16 1141    Visit Number 1   Number of Visits 8   Date for PT Re-Evaluation 01/19/17   PT Start Time 1016   PT Stop Time 1106   PT Time Calculation (min) 50 min   Activity Tolerance Patient tolerated treatment well   Behavior During Therapy [facility] for tasks assessed/performed      Past Medical History:  Diagnosis Date  . Arthralgia of knee, right   . Breast cancer 03/26/14   Left  . HTN (hypertension)   . Hypothyroidism   . Pre-diabetes     No past surgical history on file.  There were no vitals filed for this visit.       Subjective Assessment - 12/22/16 1027    Subjective Patient underwent a bilateral mastectomy in 06/2015 with 3 lymph nodes removed on the left which were negative. This surgery was done in [place] at [facility]. She had reconstruction with silicone implants done at that time but is having surgery 03/01/17 to have new implants placed. This will be done in [place]. [facility] referred her here due to concerns of cording and possibly swelling.   Pertinent History Bilateral mastectomy 06/2015 with reconstruction. Thyroid dysfunction.   Patient Stated Goals Get rid of cording in my arm before reconstruction revision. See if I have lymphedema.   Currently in Pain? No/denies            [facility] PT Assessment - 12/22/16 0001      Assessment   Medical Diagnosis Left arm cording s/p bilateral mastectomy   Referring Provider Dr. Stacie Glaze   Onset Date/Surgical Date 06/11/16  Date approximate   Hand Dominance Right   Next MD Visit none scheduled   Prior Therapy none     Precautions   Precautions Other (comment)   Precaution Comments Hx left breast cancer     Restrictions   Weight Bearing Restrictions No     Balance Screen   Has the patient fallen in the past 6 months Yes   How many times? 1  Accident at the gym as she was stepping off stepper   Has the patient had a decrease in activity level because of a fear of falling?  No   Is the patient reluctant to leave their home because of a fear of falling?  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 and weights; goes daily     Cognition   Overall Cognitive Status Within Functional Limits for tasks assessed     Posture/Postural Control   Posture/Postural Control Postural limitations   Postural Limitations Rounded Shoulders;Forward head     ROM / Strength   AROM / PROM / Strength AROM;Strength     AROM   AROM Assessment Site Shoulder   Right/Left Shoulder Right;Left   Right Shoulder  Extension 50 Degrees   Right Shoulder Flexion 136 Degrees   Right Shoulder ABduction 80 Degrees   Right Shoulder Internal Rotation 29 Degrees   Right Shoulder External Rotation 37 Degrees   Left Shoulder Extension 60 Degrees   Left Shoulder Flexion 144 Degrees   Left Shoulder ABduction 172 Degrees   Left Shoulder Internal Rotation 58 Degrees   Left Shoulder External Rotation 80 Degrees     Strength   Overall Strength Comments Strength 5/5 left UE; right UE not tested due to pain     Palpation   Palpation comment No visible cording present but deeper axillary palpation revealed mild cording.; tender to palpation left axilla and medial upper arm.           LYMPHEDEMA/ONCOLOGY QUESTIONNAIRE - 12/22/16 1045       Type   Cancer Type Left breast cancer history     Surgeries   Mastectomy Date 06/11/14   Saline Implant Reconstruction Date 06/11/14   Sentinel Lymph Node Biopsy Date 06/11/14   Number Lymph Nodes Removed 3     Date Lymphedema/Swelling Started   Date 06/11/16     Treatment   Active Chemotherapy Treatment No   Past Chemotherapy Treatment Yes   Date --  Completed after surgery for 6 months   Active Radiation Treatment No   Past Radiation Treatment No   Current Hormone Treatment No   Past Hormone Therapy No  Tried it but stopped due to side effects     What other symptoms do you have   Are you Having Heaviness or Tightness No   Are you having Pain No   Are you having pitting edema No   Is it Hard or Difficult finding clothes that fit No   Do you have infections No   Is there Decreased scar mobility No   Stemmer Sign No   Other Symptoms Sore thumb     Lymphedema Assessments   Lymphedema Assessments Upper extremities     Right Upper Extremity Lymphedema   15 cm Proximal to Olecranon Process 33.3 cm   10 cm Proximal to Olecranon Process 32 cm   Olecranon Process 26.7 cm   15 cm Proximal to Ulnar Styloid Process 26.9 cm   10 cm Proximal to Ulnar Styloid Process 21.9 cm   Just Proximal to Ulnar Styloid Process 15.9 cm   Across Hand at [employer] 20.4 cm   At [place] of 2nd Digit 6.7 cm     Left Upper Extremity Lymphedema   15 cm Proximal to Olecranon Process 32.5 cm   10 cm Proximal to Olecranon Process 32 cm   Olecranon Process 27.1 cm   15 cm Proximal to Ulnar Styloid Process 26.1 cm   10 cm Proximal to Ulnar Styloid Process 22.1 cm   Just Proximal to Ulnar Styloid Process 16.4 cm   Across Hand at [employer] 20.9 cm   At [place] of 2nd Digit 6.8 cm           Quick Dash - 12/22/16 0001    Open a tight or new jar No difficulty   Do heavy household chores (wash walls, wash floors) No difficulty   Carry a shopping bag or briefcase No difficulty   Wash your  back No difficulty   Use a knife to cut food No difficulty   Recreational activities in which you take some force or impact through your arm, shoulder, or hand (golf, hammering, tennis) No difficulty   During  the past week, to what extent has your arm, shoulder or hand problem interfered with your normal social activities with family, friends, neighbors, or groups? Not at all   During the past week, to what extent has your arm, shoulder or hand problem limited your work or other regular daily activities Not at all   Arm, shoulder, or hand pain. None   Tingling (pins and needles) in your arm, shoulder, or hand None   Difficulty Sleeping No difficulty   DASH Score 0 %                             Long Term Clinic Goals - 12/22/16 1150      CC Long Term Goal  #1   Title Patient will verbalize understanding of lymphedema risk reduction practices.   Time 4   Period Weeks   Status New     CC Long Term Goal  #2   Title Patient will report >/= 50% improvement in left axillary cording for increased ease of movement.   Time 4   Period Weeks   Status New     CC Long Term Goal  #3   Title Patient will report >/= 50% less tenderness with palpation to left axillary and upper arm region.            Plan - 12/22/16 1101    Clinical Impression Statement Patient underwent a bilateral mastectomy with reconstruction in 06/2014 with a left sentinel node biopsy.  She healed well and reports full recovery from that.  She saw her MD in December 2017 and the doctor noticed left arm cording so patient was referred to PT. Since the time she saw her MD and now, she reports her symptoms have reduced. There is no visible or palpable sign of lymphedema but she would benefit from education on lymphedema risk reduction practices. She will benefit from physical therapy to reduce mild cording in her left axilla with myofascial release and other manual techniques and will benefit from attending  the After Breast Cancer class. There are also concerns of her right shoulder ROM which is severely limited since a fall a month ago and she has been encouraged on contact her physician to have it examined. Due to her stable condition and lack of comorbidities that would impact rehab, her eval was of low complexity.   Rehab Potential Excellent   Clinical Impairments Affecting Rehab Potential None   PT Frequency 2x / week   PT Duration 4 weeks   PT Treatment/Interventions Patient/family education;Therapeutic exercise;ADLs/Self Care Home Management;Manual lymph drainage;Manual techniques;Passive range of motion;Scar mobilization   PT Next Visit Plan Myofascial release and tissue mobilization to left axillary and medial upper arm area; pulleys and general ROM stretches for HEP; attends ABC class 12/26/16   Consulted and Agree with Plan of Care Patient      Patient will benefit from skilled therapeutic intervention in order to improve the following deficits and impairments:  Increased fascial restricitons, Postural dysfunction, Impaired UE functional use, Decreased knowledge of precautions  Visit Diagnosis: Abnormal posture - Plan: PT plan of care cert/re-cert  Stiffness of left shoulder, not elsewhere classified - Plan: PT plan of care cert/re-cert      G-Codes - 0000000 1156    Functional Assessment Tool Used Clinical Judgement   Functional Limitation Carrying, moving and handling objects   Carrying, Moving and Handling Objects Current Status [id number]) At least 1 percent but less  than 20 percent impaired, limited or restricted   Carrying, Moving and Handling Objects Goal Status [id number]) 0 percent impaired, limited or restricted       Problem List Patient Active Problem List   Diagnosis Date Noted  . Malignant neoplasm of central portion of left female breast ([place]) 04/16/2014    Annia Friendly, PT 12/22/16 11:59 AM  [place], [place], [address] Phone: [phone]   Fax:  [phone]  Name: Kayla Miles MRN: [id number] Date of Birth: 11-26-1951

## 2016-12-26 ENCOUNTER — Ambulatory Visit: Payer: Non-veteran care | Admitting: Physical Therapy

## 2017-01-02 ENCOUNTER — Ambulatory Visit: Payer: Non-veteran care

## 2017-01-04 ENCOUNTER — Ambulatory Visit: Payer: Non-veteran care

## 2017-01-04 DIAGNOSIS — R293 Abnormal posture: Secondary | ICD-10-CM | POA: Diagnosis not present

## 2017-01-04 DIAGNOSIS — M25612 Stiffness of left shoulder, not elsewhere classified: Secondary | ICD-10-CM

## 2017-01-04 NOTE — Therapy (Addendum)
[place], [place], [address] Phone: [phone]   Fax:  [phone]  Physical Therapy Treatment  Patient Details  Name: Kayla Miles MRN: [id number] Date of Birth: [birth year] Referring Provider: Dr. Stacie Glaze  Encounter Date: 01/04/2017      PT End of Session - 01/04/17 1200    Visit Number 2   Number of Visits 8   Date for PT Re-Evaluation 01/19/17   PT Start Time 1102   PT Stop Time 1149   PT Time Calculation (min) 47 min   Activity Tolerance Patient tolerated treatment well   Behavior During Therapy [facility] for tasks assessed/performed      Past Medical History:  Diagnosis Date  . Arthralgia of knee, right   . Breast cancer 03/26/14   Left  . HTN (hypertension)   . Hypothyroidism   . Pre-diabetes     No past surgical history on file.  There were no vitals filed for this visit.      Subjective Assessment - 01/04/17 1107    Subjective I can't feel my cord at all anymore in my Lt axilla so I don't know if I need to still come. My Rt shoulder still hurts alot, they did an xray which didn't show anything so my doctor said if the pain doesn't get better the next step is an MRI.    Patient Stated Goals Get rid of cording in my arm before reconstruction revision. See if I have lymphedema.   Currently in Pain? No/denies            [facility] PT Assessment - 01/04/17 0001      AROM   Right Shoulder Flexion 138 Degrees  Pain at this point   Right Shoulder ABduction 72 Degrees  Pt limited by pain with this   Left Shoulder Flexion 156 Degrees                     [facility] Adult PT Treatment/Exercise - 01/04/17 0001      Self-Care   Self-Care Other Self-Care Comments   Other Self-Care Comments  Spoke with pt regarding goals of Lt UE and she has met them all reporting no issue with this. Also spent time answering pts questions regarding her Rt shoulder pain and tried to  add dowel exercises for Lt UE in case she needs to stretch in future (pt has another reconstruction surgery in March) but pt unable to perform due to Rt shoulder pain so had to stop them. Also began instruction of lymphedema risk reduction practices but she reports wants to come to ABC class so did not go into alot of detail other than to be mindful of Lt UE when exercising (checking it after by visual examination and to rest if UE feels heavy/achy) and being careful to avoid highly repetitive activities.     Shoulder Exercises: Pulleys   Flexion 3 minutes   ABduction 3 minutes     Shoulder Exercises: ROM/Strengthening   Other ROM/Strengthening Exercises Tried supine dowel flexion but too much pain so in Rt shoulder so switched to standing and this was tolerable but abduction was completely intolerable due to pain so stopped this as well.                         [facility] Goals - 01/04/17 1210      CC Long Term Goal  #1   Title Patient  will verbalize understanding of lymphedema risk reduction practices.   Baseline Began instruction but pt plans on attending ABC class-01/04/17   Status On-going     CC Long Term Goal  #2   Title Patient will report >/= 50% improvement in left axillary cording for increased ease of movement.   Baseline 100% improvement reported-01/04/17   Status Achieved     CC Long Term Goal  #3   Title Patient will report >/= 50% less tenderness with palpation to left axillary and upper arm region.   Baseline Pt reports not having an issue with this at all-01/04/17   Status Achieved            Plan - 01/04/17 1201    Clinical Impression Statement Spent beginning of treatment discussing and measuring pts Lt shoulder and ROM which is full and she has met these goals. Upon visual inspection and palptaion with Lt UE in end ROM cording is difficult to detect and pt reports not feeling it.  Tried to incorporate dowel exercises in case tightness resumes  (she reports her cord comes and goes in Lt axilla and has upcoming reconstruction surgery in March)) for her HEP but she was unable to do these due to maximal pain in her Rt shoulder with all motions. Suggested pt call doctor (as Leone Payor, PT did when she saw pt last week as well) as she hasn't had any improvement with pain since her fall almost 6 weeks ago. She agreed to this and we decided pt on hold until she hears from doctor who had told her an MRI would be next step (already had an xray about 2 weeks ago which didn't show anything). Pt was unable to attend After Breast Cancer Class on 12/26/16 but still plans on attending in near future.    Rehab Potential Excellent   Clinical Impairments Affecting Rehab Potential None   PT Frequency 2x / week   PT Duration 4 weeks   PT Treatment/Interventions Patient/family education;Therapeutic exercise;ADLs/Self Care Home Management;Manual lymph drainage;Manual techniques;Passive range of motion;Scar mobilization   PT Next Visit Plan Pt has met 2/3 goals for Lt UE and does not require further treatment. On hold until speaking with her doctor about Rt shoulder and may return for treatment of this. Pt would need to see a PT next visit if so.    Recommended Other Services Pt still plans on attending After Breast Cancer Class in near future.    Consulted and Agree with Plan of Care Patient      Patient will benefit from skilled therapeutic intervention in order to improve the following deficits and impairments:  Increased fascial restricitons, Postural dysfunction, Impaired UE functional use, Decreased knowledge of precautions  Visit Diagnosis: Abnormal posture  Stiffness of left shoulder, not elsewhere classified     Problem List Patient Active Problem List   Diagnosis Date Noted  . Malignant neoplasm of central portion of left female breast ([place]) 04/16/2014    Otelia Limes, PTA 01/04/2017, 12:15 PM  [place], [place], [address] Phone: [phone]   Fax:  [phone]  Name: Kayla Miles MRN: [id number] Date of Birth: 22-Jan-1951  PHYSICAL THERAPY DISCHARGE SUMMARY  Visits from Start of Care: 2  Current functional level related to goals / functional outcomes: See above. Cording in axilla is gone but shoulder pain persists.   Remaining deficits: See above   Education / Equipment: HEP Plan: Patient agrees to discharge.  Patient goals were not met. Patient is being discharged due to a change in medical status.  ?????         She is pursuing a consult with an orthopedist for her shoulder pain. Annia Friendly, [place] 01/11/18 12:02 PM

## 2018-03-07 ENCOUNTER — Encounter (INDEPENDENT_AMBULATORY_CARE_PROVIDER_SITE_OTHER): Payer: Self-pay | Admitting: Orthopaedic Surgery

## 2018-03-07 ENCOUNTER — Ambulatory Visit (INDEPENDENT_AMBULATORY_CARE_PROVIDER_SITE_OTHER): Payer: PRIVATE HEALTH INSURANCE | Admitting: Orthopaedic Surgery

## 2018-03-07 DIAGNOSIS — M1711 Unilateral primary osteoarthritis, right knee: Secondary | ICD-10-CM | POA: Diagnosis not present

## 2018-03-07 DIAGNOSIS — M25561 Pain in right knee: Secondary | ICD-10-CM | POA: Diagnosis not present

## 2018-03-07 DIAGNOSIS — M25562 Pain in left knee: Secondary | ICD-10-CM | POA: Diagnosis not present

## 2018-03-07 DIAGNOSIS — G8929 Other chronic pain: Secondary | ICD-10-CM | POA: Diagnosis not present

## 2018-03-07 NOTE — Progress Notes (Signed)
Office Visit Note   Patient: Kayla Miles           Date of Birth: October 22, 1951           MRN: [id number] Visit Date: 03/07/2018              Requested by: No referring provider defined for this encounter. PCP: Henreitta Cea, MD   Assessment & Plan: Visit Diagnoses:  1. Chronic pain of left knee   2. Chronic pain of right knee   3. Unilateral primary osteoarthritis, right knee     Plan: At this point she is interested in total knee replacement surgery.  We had a long and thorough discussion about this as well as showing her knee model explained in detail what the surgery involves including the assessment of the risk and benefits of surgery.  We talked about her intraoperative and postoperative course as well.  All questions concerns were answered and addressed.  She is interested in having surgery sometime later in June of this year.  I would like to see her back on May 1 to place a steroid injection release her left knee and potentially in the right knee before she has a trip where she is traveling out of the country.  At that visit we can then work on setting her up for a left total knee arthroplasty later in June.  Follow-Up Instructions: Return in about 5 weeks (around 04/11/2018).   Orders:  No orders of the defined types were placed in this encounter.  No orders of the defined types were placed in this encounter.     Procedures: No procedures performed   Clinical Data: No additional findings.   Subjective: Chief Complaint  Patient presents with  . Left Knee - Pain  The patient is coming in today for evaluation treatment of left knee pain and arthritis.  She has a history of arthroscopic intervention of that left knee in 2010 for a large.  Since then she is had slow progressive worsening of knee pain with no locking catching.  Is mainly stiffness and pain.  It hurts her when she is first up in the morning or if she is been sitting for long period time and she  gets up to move she is very stiff.  She has had multiple injections of steroids in that knee over the years.  She is also had hyaluronic acid.  At this point her pain is daily.  It is detrimentally affecting her active daily living, quality of life, mobility.  She is failed conservative treatment for now well over 5 years or more.  She is a very active 67 year old female with no significant medical issues.  She has had surgery on her right knee before as well arthroscopically.  She said that is been giving her some problems as well.  HPI  Review of Systems She currently denies any headache, chest pain, shortness of breath, fever, chills, nausea, vomiting.  Objective: Vital Signs: There were no vitals taken for this visit.  Physical Exam She is alert and oriented x3 and in no acute distress Ortho Exam Examination of her left knee shows well-healed arthroscopy portal sites.  She has slight varus malalignment of the knee.  She has full extension but flexion past 90 degrees is very painful.  The knee feels ligamentously stable. Specialty Comments:  No specialty comments available.  Imaging: No results found. X-rays that accompany here that are on a disc show tricompartmental arthritic changes of her  left knee with periarticular osteophytes throughout and joint space narrowing throughout.  PMFS History: Patient Active Problem List   Diagnosis Date Noted  . Unilateral primary osteoarthritis, right knee 03/07/2018  . Chronic pain of right knee 03/07/2018  . Chronic pain of left knee 03/07/2018  . Malignant neoplasm of central portion of left female breast ([place]) 04/16/2014   Past Medical History:  Diagnosis Date  . Arthralgia of knee, right   . Breast cancer ([place]) 03/26/14   Left  . HTN (hypertension)   . Hypothyroidism   . Pre-diabetes     Family History  Problem Relation Age of Onset  . Colon cancer Father 37  . Colon cancer Paternal Uncle     History reviewed. No pertinent  surgical history. Social History   Occupational History  . Not on file  Tobacco Use  . Smoking status: Never Smoker  . Smokeless tobacco: Never Used  Substance and Sexual Activity  . Alcohol use: No  . Drug use: Not on file  . Sexual activity: Not on file

## 2018-04-11 ENCOUNTER — Ambulatory Visit (INDEPENDENT_AMBULATORY_CARE_PROVIDER_SITE_OTHER): Payer: PRIVATE HEALTH INSURANCE | Admitting: Orthopaedic Surgery

## 2018-04-11 ENCOUNTER — Encounter (INDEPENDENT_AMBULATORY_CARE_PROVIDER_SITE_OTHER): Payer: Self-pay | Admitting: Orthopaedic Surgery

## 2018-04-11 DIAGNOSIS — M1711 Unilateral primary osteoarthritis, right knee: Secondary | ICD-10-CM

## 2018-04-11 DIAGNOSIS — M1712 Unilateral primary osteoarthritis, left knee: Secondary | ICD-10-CM | POA: Diagnosis not present

## 2018-04-11 MED ORDER — METHYLPREDNISOLONE ACETATE 40 MG/ML IJ SUSP
40.0000 mg | INTRAMUSCULAR | Status: AC | PRN
  Administered 2018-04-11: 40 mg via INTRA_ARTICULAR

## 2018-04-11 MED ORDER — LIDOCAINE HCL 1 % IJ SOLN
0.5000 mL | INTRAMUSCULAR | Status: AC | PRN
  Administered 2018-04-11: .5 mL

## 2018-04-11 NOTE — Progress Notes (Signed)
   Procedure Note  Patient: Kayla Miles             Date of Birth: 06/14/1951           MRN: [id number]             Visit Date: 04/11/2018 HPI [name] returns today for bilateral knee injections.  She has osteoarthritis of both knees left greater than right as far as symptomatic.  She has had no known injuries.  She is leaving to go to [place] in the wants to undergo left knee arthroplasty after returning from [place] in mid June.   Procedures: Visit Diagnoses: Primary osteoarthritis of left knee  Large Joint Inj: bilateral knee on 04/11/2018 10:11 AM Indications: pain Details: 22 G 1.5 in needle, anterolateral approach  Arthrogram: No  Medications (Right): 0.5 mL lidocaine 1 %; 40 mg methylPREDNISolone acetate 40 MG/ML Medications (Left): 0.5 mL lidocaine 1 %; 40 mg methylPREDNISolone acetate 40 MG/ML Outcome: tolerated well, no immediate complications Procedure, treatment alternatives, risks and benefits explained, specific risks discussed. Consent was given by the patient. Immediately prior to procedure a time out was called to verify the correct patient, procedure, equipment, support staff and site/side marked as required. Patient was prepped and draped in the usual sterile fashion.     Plan: She is set up for a left total knee arthroplasty for mid June.  Questions were encouraged and answered at length today.  Dr. Ninfa Linden and already discussed and went over total knee arthroplasty including risk benefits.  We will see her back in about 2 weeks postop.

## 2018-04-12 ENCOUNTER — Telehealth (INDEPENDENT_AMBULATORY_CARE_PROVIDER_SITE_OTHER): Payer: Self-pay | Admitting: Orthopaedic Surgery

## 2018-04-12 NOTE — Telephone Encounter (Signed)
We have to evaluate her and make sure this is appropriate for an injection, but if so that's fine

## 2018-04-12 NOTE — Telephone Encounter (Signed)
Patient called stating the outer portion of her right foot is bothering and she would like to know if she could injection in her foot before she goes on her trip.  She leaves on May 9th.  CB#[phone].  Thank you.

## 2018-04-18 ENCOUNTER — Encounter (INDEPENDENT_AMBULATORY_CARE_PROVIDER_SITE_OTHER): Payer: Self-pay | Admitting: Orthopaedic Surgery

## 2018-04-18 ENCOUNTER — Ambulatory Visit (INDEPENDENT_AMBULATORY_CARE_PROVIDER_SITE_OTHER): Payer: PRIVATE HEALTH INSURANCE | Admitting: Orthopaedic Surgery

## 2018-04-18 DIAGNOSIS — M722 Plantar fascial fibromatosis: Secondary | ICD-10-CM | POA: Diagnosis not present

## 2018-04-18 MED ORDER — METHYLPREDNISOLONE ACETATE 40 MG/ML IJ SUSP
40.0000 mg | INTRAMUSCULAR | Status: AC | PRN
  Administered 2018-04-18: 40 mg

## 2018-04-18 MED ORDER — LIDOCAINE HCL 1 % IJ SOLN
1.0000 mL | INTRAMUSCULAR | Status: AC | PRN
  Administered 2018-04-18: 1 mL

## 2018-04-18 NOTE — Progress Notes (Signed)
Office Visit Note   Patient: Kayla Miles           Date of Birth: 04-05-1951           MRN: [id number] Visit Date: 04/18/2018              Requested by: Henreitta Cea, [place] [facility] [place], [place] [address] PCP: Henreitta Cea, MD   Assessment & Plan: Visit Diagnoses:  1. Plantar fasciitis of right foot     Plan: Her signs and symptoms does seem to be consistent with plantar fasciitis of her right foot.  Per her wishes I did provide a steroid injection over the plantar aspect of her foot and she understands the risks and benefits of these.  She is to work on her shoe wear as well as stretching.  All questions concerns were answered and addressed.  Follow-up will be as needed.  Follow-Up Instructions: Return if symptoms worsen or fail to improve.   Orders:  No orders of the defined types were placed in this encounter.  No orders of the defined types were placed in this encounter.     Procedures: Foot Inj Date/Time: 04/18/2018 9:09 AM Performed by: [name], MD Authorized by: [name], MD   Condition: Plantar Fasciitis   Location: right plantar fascia muscle   Medications:  1 mL lidocaine 1 %; 40 mg methylPREDNISolone acetate 40 MG/ML     Clinical Data: No additional findings.   Subjective: Chief Complaint  Patient presents with  . Right Foot - Pain  The patient comes in today to have an injection in her right foot.  She has a history of plantar fasciitis and last injection around 2001.  She is having have a country for trip tomorrow and she will be on her feet a lot.  She points the bottom of her heel the lateral aspect of her foot and Achilles tendon area as a source of her pain.  She says is worse in the morning when she first gets up.  She denies any specific injuries.  HPI  Review of Systems She currently denies any headache, chest pain, shortness of breath, fever, chills, nausea, vomiting.  Objective: Vital  Signs: There were no vitals taken for this visit.  Physical Exam She is alert and oriented x3 and in no acute distress Ortho Exam Examination of her right foot shows a small callus over the heel pad.  Otherwise the foot exam is normal other than deep pain in the heel and the lateral aspect palpation.  Achilles is intact and her [place] Silos test is negative there is no significant Achilles pain. Specialty Comments:  No specialty comments available.  Imaging: No results found.   PMFS History: Patient Active Problem List   Diagnosis Date Noted  . Primary osteoarthritis of left knee 04/11/2018  . Unilateral primary osteoarthritis, right knee 03/07/2018  . Chronic pain of right knee 03/07/2018  . Chronic pain of left knee 03/07/2018  . Malignant neoplasm of central portion of left female breast ([place]) 04/16/2014   Past Medical History:  Diagnosis Date  . Arthralgia of knee, right   . Breast cancer ([place]) 03/26/14   Left  . HTN (hypertension)   . Hypothyroidism   . Pre-diabetes     Family History  Problem Relation Age of Onset  . Colon cancer Father 70  . Colon cancer Paternal Uncle     History reviewed. No pertinent surgical history. Social History   Occupational History  .  Not on file  Tobacco Use  . Smoking status: Never Smoker  . Smokeless tobacco: Never Used  Substance and Sexual Activity  . Alcohol use: No  . Drug use: Not on file  . Sexual activity: Not on file

## 2018-05-03 HISTORY — PX: BREAST IMPLANT EXCHANGE: SHX6296

## 2018-05-14 ENCOUNTER — Telehealth (INDEPENDENT_AMBULATORY_CARE_PROVIDER_SITE_OTHER): Payer: Self-pay | Admitting: Orthopaedic Surgery

## 2018-05-14 NOTE — Telephone Encounter (Signed)
Please call pt to discuss pt care. Pt called left VM didn't specify what she needed.

## 2018-05-15 NOTE — Telephone Encounter (Signed)
I called patient and advised authorization is for left knee.  She will have to contact them to get them to do right knee auth.

## 2018-05-15 NOTE — Telephone Encounter (Signed)
Patient wants to change surgery to her opposite knee States that he it hurts worse, its always more swollen, etc Would like a callback today letting her know if this ok

## 2018-05-15 NOTE — Telephone Encounter (Signed)
What she need to do or are we needing to get that?

## 2018-05-15 NOTE — Telephone Encounter (Signed)
I have seen her for both of her knees and this was dictated in my previous notes.  However she did always say that her left knee hurt worse in the right.  I have documented to both knees have had arthroscopic surgery in both knees have arthritis.  Kayla Miles can try to get the authorization for her right knee but the patient will have to understand that we may have to go to extra steps of seeing her in the office to document that more if this will be problematic for the [place].

## 2018-05-15 NOTE — Telephone Encounter (Signed)
According to Dr. Ninfa Linden that is fine.  However, the [place] authorization is specifically for the left TKA.

## 2018-05-21 ENCOUNTER — Encounter (HOSPITAL_COMMUNITY): Payer: Self-pay

## 2018-05-21 NOTE — Pre-Procedure Instructions (Signed)
Kayla Miles  05/21/2018      [id number] - [place], [place] [address] [place] [address] Phone: [phone] Fax: [phone]    Your procedure is scheduled on June 18  Report to [facility] Admitting at 1:00 P.M.  Call this number if you have problems the morning of surgery:  [phone]   Remember:   No food or liquids  after midnight.                       Take these medicines the morning of surgery with A SIP OF WATER : amlodipine (norvasc), levothyroxine (synthroid)              7 days prior to surgery STOP taking any Aspirin(unless otherwise instructed by your surgeon), Aleve, Naproxen, Ibuprofen, Motrin, Advil, Goody's, BC's, all herbal medications, fish oil, and all vitamins    Do not wear jewelry, make-up or nail polish.  Do not wear lotions, powders, or perfumes, or deodorant.  Do not shave 48 hours prior to surgery.  Men may shave face and neck.  Do not bring valuables to the hospital.  [facility] is not responsible for any belongings or valuables.  Contacts, dentures or bridgework may not be worn into surgery.  Leave your suitcase in the car.  After surgery it may be brought to your room.  For patients admitted to the hospital, discharge time will be determined by your treatment team.  Patients discharged the day of surgery will not be allowed to drive home.    Special instructions:  [facility]- Preparing For Surgery  Before surgery, you can play an important role. Because skin is not sterile, your skin needs to be as free of germs as possible. You can reduce the number of germs on your skin by washing with CHG (chlorahexidine gluconate) Soap before surgery.  CHG is an antiseptic cleaner which kills germs and bonds with the skin to continue killing germs even after washing.    Oral Hygiene is also important to reduce your risk of infection.  Remember -  BRUSH YOUR TEETH THE MORNING OF SURGERY WITH YOUR REGULAR TOOTHPASTE  Please do not use if you have an allergy to CHG or antibacterial soaps. If your skin becomes reddened/irritated stop using the CHG.  Do not shave (including legs and underarms) for at least 48 hours prior to first CHG shower. It is OK to shave your face.  Please follow these instructions carefully.   1. Shower the NIGHT BEFORE SURGERY and the MORNING OF SURGERY with CHG.   2. If you chose to wash your hair, wash your hair first as usual with your normal shampoo.  3. After you shampoo, rinse your hair and body thoroughly to remove the shampoo.  4. Use CHG as you would any other liquid soap. You can apply CHG directly to the skin and wash gently with a scrungie or a clean washcloth.   5. Apply the CHG Soap to your body ONLY FROM THE NECK DOWN.  Do not use on open wounds or open sores. Avoid contact with your eyes, ears, mouth and genitals (private parts). Wash Face and genitals (private parts)  with your normal soap.  6. Wash thoroughly, paying special attention to the area where your surgery will be performed.  7. Thoroughly rinse your body with warm water from the neck down.  8. DO NOT  shower/wash with your normal soap after using and rinsing off the CHG Soap.  9. Pat yourself dry with a CLEAN TOWEL.  10. Wear CLEAN PAJAMAS to bed the night before surgery, wear comfortable clothes the morning of surgery  11. Place CLEAN SHEETS on your bed the night of your first shower and DO NOT SLEEP WITH PETS.    Day of Surgery:  Do not apply any deodorants/lotions.  Please wear clean clothes to the hospital/surgery center.   Remember to brush your teeth WITH YOUR REGULAR TOOTHPASTE.    Please read over the following fact sheets that you were given. Coughing and Deep Breathing, MRSA Information and Surgical Site Infection Prevention

## 2018-05-22 ENCOUNTER — Other Ambulatory Visit: Payer: Self-pay

## 2018-05-22 ENCOUNTER — Encounter (HOSPITAL_COMMUNITY): Payer: Self-pay

## 2018-05-22 ENCOUNTER — Other Ambulatory Visit (INDEPENDENT_AMBULATORY_CARE_PROVIDER_SITE_OTHER): Payer: Self-pay | Admitting: Orthopaedic Surgery

## 2018-05-22 ENCOUNTER — Encounter (HOSPITAL_COMMUNITY)
Admission: RE | Admit: 2018-05-22 | Discharge: 2018-05-22 | Disposition: A | Discharge status: Home / Self Care | Payer: Non-veteran care | Source: Ambulatory Visit | Attending: Orthopaedic Surgery | Admitting: Orthopaedic Surgery

## 2018-05-22 DIAGNOSIS — Z01810 Encounter for preprocedural cardiovascular examination: Secondary | ICD-10-CM | POA: Insufficient documentation

## 2018-05-22 DIAGNOSIS — Z01812 Encounter for preprocedural laboratory examination: Secondary | ICD-10-CM | POA: Diagnosis present

## 2018-05-22 DIAGNOSIS — M1712 Unilateral primary osteoarthritis, left knee: Secondary | ICD-10-CM

## 2018-05-22 HISTORY — DX: Unspecified osteoarthritis, unspecified site: M19.90

## 2018-05-22 LAB — SURGICAL PCR SCREEN
MRSA, PCR: NEGATIVE
Staphylococcus aureus: NEGATIVE

## 2018-05-22 LAB — BASIC METABOLIC PANEL
Anion gap: 7 (ref 5–15)
BUN: 18 mg/dL (ref 6–20)
CO2: 24 mmol/L (ref 22–32)
CREATININE: 0.96 mg/dL (ref 0.44–1.00)
Calcium: 9 mg/dL (ref 8.9–10.3)
Chloride: 112 mmol/L — ABNORMAL HIGH (ref 101–111)
GFR calc non Af Amer: >60 mL/min (ref >60)
Glucose, Bld: 96 mg/dL (ref 65–99)
Potassium: 4.1 mmol/L (ref 3.5–5.1)
Sodium: 143 mmol/L (ref 135–145)

## 2018-05-22 LAB — CBC
HCT: 39.1 % (ref 36.0–46.0)
Hemoglobin: 12.2 g/dL (ref 12.0–15.0)
MCH: 22.5 pg — AB (ref 26.0–34.0)
MCHC: 31.2 g/dL (ref 30.0–36.0)
MCV: 72.1 fL — ABNORMAL LOW (ref 78.0–100.0)
Platelets: 297 10*3/uL (ref 150–400)
RBC: 5.42 MIL/uL — ABNORMAL HIGH (ref 3.87–5.11)
RDW: 16.5 % — ABNORMAL HIGH (ref 11.5–15.5)
WBC: 7 10*3/uL (ref 4.0–10.5)

## 2018-05-22 NOTE — Progress Notes (Signed)
PCP - Dr. Kevan Ny Cardiologist - Patient denies  Chest x-ray - N/A EKG - 05/22/2018 Stress Test - 15+ years ago; pt stated test was negative and has not had follow up since.  ECHO - patient denies Cardiac Cath - patient denies  Sleep Study - patient denies    Blood Thinner Instructions: N/A Aspirin Instructions:N/A  Anesthesia review: N/A  Patient denies shortness of breath, fever, cough and chest pain at PAT appointment   Patient verbalized understanding of instructions that were given to them at the PAT appointment. Patient was also instructed that they will need to review over the PAT instructions again at home before surgery.

## 2018-05-22 NOTE — Progress Notes (Signed)
Spoke with Almedia Balls at Dr. Trevor Mace office to confirm the correct laterality for surgery.  Sherrie stated she was waiting on authorization from the [place].  Once received, she will change the surgical posting as well as put in a new order for consent with the correct laterality, per patient is the right knee, and we can have the patient sign her consent for surgery on the day of her surgery.

## 2018-05-25 ENCOUNTER — Other Ambulatory Visit (INDEPENDENT_AMBULATORY_CARE_PROVIDER_SITE_OTHER): Payer: Self-pay | Admitting: Orthopaedic Surgery

## 2018-05-28 MED ORDER — TRANEXAMIC ACID 1000 MG/10ML IV SOLN
1000.0000 mg | INTRAVENOUS | Status: AC
  Administered 2018-05-29: 1000 mg via INTRAVENOUS
  Filled 2018-05-28: qty 1100

## 2018-05-29 ENCOUNTER — Inpatient Hospital Stay (HOSPITAL_COMMUNITY): Payer: No Typology Code available for payment source | Admitting: Certified Registered Nurse Anesthetist

## 2018-05-29 ENCOUNTER — Inpatient Hospital Stay (HOSPITAL_COMMUNITY)
Admission: RE | Admit: 2018-05-29 | Discharge: 2018-05-31 | DRG: 470 | Disposition: A | Discharge status: Home Health | Payer: No Typology Code available for payment source | Attending: Orthopaedic Surgery | Admitting: Orthopaedic Surgery

## 2018-05-29 ENCOUNTER — Inpatient Hospital Stay (HOSPITAL_COMMUNITY): Payer: No Typology Code available for payment source

## 2018-05-29 ENCOUNTER — Encounter (HOSPITAL_COMMUNITY)
Admission: RE | Disposition: A | Discharge status: Home Health | Payer: Self-pay | Source: Home / Self Care | Attending: Orthopaedic Surgery

## 2018-05-29 ENCOUNTER — Encounter (HOSPITAL_COMMUNITY): Payer: Self-pay | Admitting: *Deleted

## 2018-05-29 DIAGNOSIS — Z96651 Presence of right artificial knee joint: Secondary | ICD-10-CM

## 2018-05-29 DIAGNOSIS — Z9013 Acquired absence of bilateral breasts and nipples: Secondary | ICD-10-CM | POA: Diagnosis not present

## 2018-05-29 DIAGNOSIS — I10 Essential (primary) hypertension: Secondary | ICD-10-CM | POA: Diagnosis present

## 2018-05-29 DIAGNOSIS — M1711 Unilateral primary osteoarthritis, right knee: Secondary | ICD-10-CM | POA: Diagnosis present

## 2018-05-29 DIAGNOSIS — Z6831 Body mass index (BMI) 31.0-31.9, adult: Secondary | ICD-10-CM

## 2018-05-29 DIAGNOSIS — Z9689 Presence of other specified functional implants: Secondary | ICD-10-CM | POA: Diagnosis present

## 2018-05-29 DIAGNOSIS — E039 Hypothyroidism, unspecified: Secondary | ICD-10-CM | POA: Diagnosis present

## 2018-05-29 DIAGNOSIS — M25561 Pain in right knee: Secondary | ICD-10-CM | POA: Diagnosis present

## 2018-05-29 DIAGNOSIS — M1712 Unilateral primary osteoarthritis, left knee: Secondary | ICD-10-CM

## 2018-05-29 DIAGNOSIS — Z91041 Radiographic dye allergy status: Secondary | ICD-10-CM

## 2018-05-29 DIAGNOSIS — Z888 Allergy status to other drugs, medicaments and biological substances status: Secondary | ICD-10-CM

## 2018-05-29 DIAGNOSIS — M170 Bilateral primary osteoarthritis of knee: Principal | ICD-10-CM | POA: Diagnosis present

## 2018-05-29 DIAGNOSIS — Z853 Personal history of malignant neoplasm of breast: Secondary | ICD-10-CM | POA: Diagnosis not present

## 2018-05-29 DIAGNOSIS — Z885 Allergy status to narcotic agent status: Secondary | ICD-10-CM

## 2018-05-29 DIAGNOSIS — E669 Obesity, unspecified: Secondary | ICD-10-CM | POA: Diagnosis present

## 2018-05-29 DIAGNOSIS — R110 Nausea: Secondary | ICD-10-CM | POA: Diagnosis not present

## 2018-05-29 HISTORY — PX: TOTAL KNEE ARTHROPLASTY: SHX125

## 2018-05-29 SURGERY — ARTHROPLASTY, KNEE, TOTAL
Anesthesia: Monitor Anesthesia Care | Site: Knee | Laterality: Right

## 2018-05-29 MED ORDER — BUPIVACAINE IN DEXTROSE 0.75-8.25 % IT SOLN
INTRATHECAL | Status: DC | PRN
  Administered 2018-05-29: 1.8 mL via INTRATHECAL

## 2018-05-29 MED ORDER — ONDANSETRON HCL 4 MG/2ML IJ SOLN
4.0000 mg | Freq: Four times a day (QID) | INTRAMUSCULAR | Status: DC | PRN
  Administered 2018-05-29 – 2018-05-30 (×3): 4 mg via INTRAVENOUS
  Filled 2018-05-29 (×3): qty 2

## 2018-05-29 MED ORDER — ALUM & MAG HYDROXIDE-SIMETH 200-200-20 MG/5ML PO SUSP: 30.0000 mL | ORAL | Status: DC | PRN

## 2018-05-29 MED ORDER — CEFAZOLIN SODIUM-DEXTROSE 2-4 GM/100ML-% IV SOLN
INTRAVENOUS | Status: AC
  Filled 2018-05-29: qty 100

## 2018-05-29 MED ORDER — PROPOFOL 10 MG/ML IV BOLUS
INTRAVENOUS | Status: AC
  Filled 2018-05-29: qty 20

## 2018-05-29 MED ORDER — METHOCARBAMOL 500 MG PO TABS
500.0000 mg | ORAL_TABLET | Freq: Four times a day (QID) | ORAL | Status: DC | PRN
  Administered 2018-05-29 – 2018-05-31 (×6): 500 mg via ORAL
  Filled 2018-05-29 (×7): qty 1

## 2018-05-29 MED ORDER — PHENYLEPHRINE HCL 10 MG/ML IJ SOLN
INTRAVENOUS | Status: DC | PRN
  Administered 2018-05-29: 25 ug/min via INTRAVENOUS

## 2018-05-29 MED ORDER — OXYCODONE HCL 5 MG PO TABS
5.0000 mg | ORAL_TABLET | ORAL | Status: DC | PRN
  Filled 2018-05-29: qty 2

## 2018-05-29 MED ORDER — ASPIRIN EC 325 MG PO TBEC
325.0000 mg | DELAYED_RELEASE_TABLET | Freq: Every day | ORAL | Status: DC
  Administered 2018-05-30 – 2018-05-31 (×2): 325 mg via ORAL
  Filled 2018-05-29 (×2): qty 1

## 2018-05-29 MED ORDER — PROPOFOL 500 MG/50ML IV EMUL
INTRAVENOUS | Status: DC | PRN
  Administered 2018-05-29: 100 ug/kg/min via INTRAVENOUS

## 2018-05-29 MED ORDER — CEFAZOLIN SODIUM-DEXTROSE 2-4 GM/100ML-% IV SOLN
2.0000 g | INTRAVENOUS | Status: AC
  Administered 2018-05-29: 2 g via INTRAVENOUS

## 2018-05-29 MED ORDER — DIPHENHYDRAMINE HCL 12.5 MG/5ML PO ELIX
12.5000 mg | ORAL_SOLUTION | ORAL | Status: DC | PRN
  Filled 2018-05-29: qty 10

## 2018-05-29 MED ORDER — METOCLOPRAMIDE HCL 5 MG PO TABS
5.0000 mg | ORAL_TABLET | Freq: Three times a day (TID) | ORAL | Status: DC | PRN
  Administered 2018-05-30 – 2018-05-31 (×2): 10 mg via ORAL
  Filled 2018-05-29 (×2): qty 2

## 2018-05-29 MED ORDER — FENTANYL CITRATE (PF) 250 MCG/5ML IJ SOLN
INTRAMUSCULAR | Status: AC
  Filled 2018-05-29: qty 5

## 2018-05-29 MED ORDER — ONDANSETRON HCL 4 MG PO TABS
4.0000 mg | ORAL_TABLET | Freq: Four times a day (QID) | ORAL | Status: DC | PRN
  Administered 2018-05-30 – 2018-05-31 (×3): 4 mg via ORAL
  Filled 2018-05-29 (×3): qty 1

## 2018-05-29 MED ORDER — FENTANYL CITRATE (PF) 100 MCG/2ML IJ SOLN
INTRAMUSCULAR | Status: AC
  Administered 2018-05-29: 50 ug
  Filled 2018-05-29: qty 2

## 2018-05-29 MED ORDER — METOCLOPRAMIDE HCL 5 MG/ML IJ SOLN
5.0000 mg | Freq: Three times a day (TID) | INTRAMUSCULAR | Status: DC | PRN
  Administered 2018-05-30: 10 mg via INTRAVENOUS
  Filled 2018-05-29: qty 2

## 2018-05-29 MED ORDER — HYDROMORPHONE HCL 2 MG/ML IJ SOLN
0.5000 mg | INTRAMUSCULAR | Status: DC | PRN
  Administered 2018-05-29 – 2018-05-30 (×3): 1 mg via INTRAVENOUS
  Filled 2018-05-29 (×3): qty 1

## 2018-05-29 MED ORDER — MENTHOL 3 MG MT LOZG: 1.0000 | LOZENGE | OROMUCOSAL | Status: DC | PRN

## 2018-05-29 MED ORDER — POLYETHYLENE GLYCOL 3350 17 G PO PACK: 17.0000 g | PACK | Freq: Every day | ORAL | Status: DC | PRN

## 2018-05-29 MED ORDER — SODIUM CHLORIDE 0.9 % IV SOLN
INTRAVENOUS | Status: DC
  Administered 2018-05-29: 17:00:00 via INTRAVENOUS

## 2018-05-29 MED ORDER — 0.9 % SODIUM CHLORIDE (POUR BTL) OPTIME
TOPICAL | Status: DC | PRN
  Administered 2018-05-29: 1000 mL

## 2018-05-29 MED ORDER — OXYCODONE HCL 5 MG PO TABS
10.0000 mg | ORAL_TABLET | ORAL | Status: DC | PRN
  Administered 2018-05-29: 10 mg via ORAL
  Administered 2018-05-30 (×3): 15 mg via ORAL
  Administered 2018-05-30: 10 mg via ORAL
  Administered 2018-05-31 (×3): 15 mg via ORAL
  Filled 2018-05-29 (×2): qty 3
  Filled 2018-05-29: qty 2
  Filled 2018-05-29 (×4): qty 3

## 2018-05-29 MED ORDER — LEVOTHYROXINE SODIUM 88 MCG PO TABS
88.0000 ug | ORAL_TABLET | Freq: Every day | ORAL | Status: DC
  Administered 2018-05-30 – 2018-05-31 (×2): 88 ug via ORAL
  Filled 2018-05-29 (×2): qty 1

## 2018-05-29 MED ORDER — GABAPENTIN 100 MG PO CAPS
100.0000 mg | ORAL_CAPSULE | Freq: Three times a day (TID) | ORAL | Status: DC
  Administered 2018-05-29 – 2018-05-31 (×6): 100 mg via ORAL
  Filled 2018-05-29 (×7): qty 1

## 2018-05-29 MED ORDER — MIDAZOLAM HCL 2 MG/2ML IJ SOLN
INTRAMUSCULAR | Status: AC
  Administered 2018-05-29: 1 mg
  Filled 2018-05-29: qty 2

## 2018-05-29 MED ORDER — OXYCODONE HCL 5 MG PO TABS: 5.0000 mg | ORAL_TABLET | Freq: Once | ORAL | Status: DC | PRN

## 2018-05-29 MED ORDER — DEXAMETHASONE SODIUM PHOSPHATE 10 MG/ML IJ SOLN
INTRAMUSCULAR | Status: DC | PRN
  Administered 2018-05-29: 10 mg via INTRAVENOUS

## 2018-05-29 MED ORDER — ONDANSETRON HCL 4 MG/2ML IJ SOLN
INTRAMUSCULAR | Status: DC | PRN
  Administered 2018-05-29: 4 mg via INTRAVENOUS

## 2018-05-29 MED ORDER — HYDROMORPHONE HCL 2 MG/ML IJ SOLN: 0.2500 mg | INTRAMUSCULAR | Status: DC | PRN

## 2018-05-29 MED ORDER — OXYCODONE HCL 5 MG/5ML PO SOLN: 5.0000 mg | Freq: Once | ORAL | Status: DC | PRN

## 2018-05-29 MED ORDER — ONDANSETRON HCL 4 MG/2ML IJ SOLN: 4.0000 mg | Freq: Four times a day (QID) | INTRAMUSCULAR | Status: DC | PRN

## 2018-05-29 MED ORDER — CEFAZOLIN SODIUM-DEXTROSE 1-4 GM/50ML-% IV SOLN
1.0000 g | Freq: Four times a day (QID) | INTRAVENOUS | Status: AC
  Administered 2018-05-29 – 2018-05-30 (×2): 1 g via INTRAVENOUS
  Filled 2018-05-29 (×2): qty 50

## 2018-05-29 MED ORDER — PROPOFOL 10 MG/ML IV BOLUS
INTRAVENOUS | Status: DC | PRN
  Administered 2018-05-29: 35 mg via INTRAVENOUS

## 2018-05-29 MED ORDER — PHENOL 1.4 % MT LIQD: 1.0000 | OROMUCOSAL | Status: DC | PRN

## 2018-05-29 MED ORDER — AMLODIPINE BESYLATE 5 MG PO TABS
5.0000 mg | ORAL_TABLET | ORAL | Status: DC
  Administered 2018-05-31: 5 mg via ORAL
  Filled 2018-05-29: qty 1

## 2018-05-29 MED ORDER — ONDANSETRON HCL 4 MG/2ML IJ SOLN
INTRAMUSCULAR | Status: AC
  Filled 2018-05-29: qty 2

## 2018-05-29 MED ORDER — ZOLPIDEM TARTRATE 5 MG PO TABS
5.0000 mg | ORAL_TABLET | Freq: Every evening | ORAL | Status: DC | PRN
  Administered 2018-05-29: 5 mg via ORAL
  Filled 2018-05-29: qty 1

## 2018-05-29 MED ORDER — METHOCARBAMOL 1000 MG/10ML IJ SOLN
500.0000 mg | Freq: Four times a day (QID) | INTRAVENOUS | Status: DC | PRN
  Filled 2018-05-29: qty 5

## 2018-05-29 MED ORDER — LIDOCAINE 2% (20 MG/ML) 5 ML SYRINGE
INTRAMUSCULAR | Status: DC | PRN
  Administered 2018-05-29: 40 mg via INTRAVENOUS

## 2018-05-29 MED ORDER — DEXAMETHASONE SODIUM PHOSPHATE 10 MG/ML IJ SOLN
INTRAMUSCULAR | Status: AC
  Filled 2018-05-29: qty 1

## 2018-05-29 MED ORDER — SODIUM CHLORIDE 0.9 % IR SOLN
Status: DC | PRN
  Administered 2018-05-29: 1000 mL

## 2018-05-29 MED ORDER — ACETAMINOPHEN 325 MG PO TABS
325.0000 mg | ORAL_TABLET | Freq: Four times a day (QID) | ORAL | Status: DC | PRN
  Administered 2018-05-30: 650 mg via ORAL
  Filled 2018-05-29 (×2): qty 2

## 2018-05-29 MED ORDER — ROPIVACAINE HCL 7.5 MG/ML IJ SOLN
INTRAMUSCULAR | Status: DC | PRN
  Administered 2018-05-29: 20 mL via PERINEURAL

## 2018-05-29 MED ORDER — PANTOPRAZOLE SODIUM 40 MG PO TBEC
40.0000 mg | DELAYED_RELEASE_TABLET | Freq: Every day | ORAL | Status: DC
  Administered 2018-05-30 – 2018-05-31 (×2): 40 mg via ORAL
  Filled 2018-05-29 (×2): qty 1

## 2018-05-29 MED ORDER — DOCUSATE SODIUM 100 MG PO CAPS
100.0000 mg | ORAL_CAPSULE | Freq: Two times a day (BID) | ORAL | Status: DC
  Administered 2018-05-29 – 2018-05-31 (×4): 100 mg via ORAL
  Filled 2018-05-29 (×4): qty 1

## 2018-05-29 MED ORDER — LACTATED RINGERS IV SOLN
INTRAVENOUS | Status: DC
  Administered 2018-05-29: 12:00:00 via INTRAVENOUS

## 2018-05-29 SURGICAL SUPPLY — 66 items
BANDAGE ACE 6X5 VEL STRL LF (GAUZE/BANDAGES/DRESSINGS) ×6 IMPLANT
BANDAGE ESMARK 6X9 LF (GAUZE/BANDAGES/DRESSINGS) ×1 IMPLANT
BEARIN INSERT TIBIAL 11 (Orthopedic Implant) ×2 IMPLANT
BEARIN INSERT TIBIAL 11MM (Orthopedic Implant) ×1 IMPLANT
BEARING INSERT TIBIAL 11 (Orthopedic Implant) ×1 IMPLANT
BLADE SAG 18X100X1.27 (BLADE) ×3 IMPLANT
BNDG ESMARK 6X9 LF (GAUZE/BANDAGES/DRESSINGS) ×3
BOWL SMART MIX CTS (DISPOSABLE) ×3 IMPLANT
CLOSURE WOUND 1/2 X4 (GAUZE/BANDAGES/DRESSINGS)
COVER SURGICAL LIGHT HANDLE (MISCELLANEOUS) ×3 IMPLANT
CUFF TOURNIQUET SINGLE 34IN LL (TOURNIQUET CUFF) ×3 IMPLANT
CUFF TOURNIQUET SINGLE 44IN (TOURNIQUET CUFF) IMPLANT
DRAPE EXTREMITY T 121X128X90 (DRAPE) ×3 IMPLANT
DRAPE HALF SHEET 40X57 (DRAPES) ×3 IMPLANT
DRAPE U-SHAPE 47X51 STRL (DRAPES) ×3 IMPLANT
DRSG PAD ABDOMINAL 8X10 ST (GAUZE/BANDAGES/DRESSINGS) ×6 IMPLANT
DURAPREP 26ML APPLICATOR (WOUND CARE) ×3 IMPLANT
ELECT CAUTERY BLADE 6.4 (BLADE) ×3 IMPLANT
ELECT REM PT RETURN 9FT ADLT (ELECTROSURGICAL) ×3
ELECTRODE REM PT RTRN 9FT ADLT (ELECTROSURGICAL) ×1 IMPLANT
FACESHIELD WRAPAROUND (MASK) ×6 IMPLANT
FEMORAL POSTERIOR SZ4 RT (Femur) ×1 IMPLANT
GAUZE SPONGE 4X4 12PLY STRL (GAUZE/BANDAGES/DRESSINGS) ×3 IMPLANT
GAUZE XEROFORM 1X8 LF (GAUZE/BANDAGES/DRESSINGS) ×3 IMPLANT
GLOVE BIOGEL PI IND STRL 8 (GLOVE) ×2 IMPLANT
GLOVE BIOGEL PI INDICATOR 8 (GLOVE) ×4
GLOVE ORTHO TXT STRL SZ7.5 (GLOVE) ×3 IMPLANT
GLOVE SURG ORTHO 8.0 STRL STRW (GLOVE) ×3 IMPLANT
GOWN STRL REUS W/ TWL LRG LVL3 (GOWN DISPOSABLE) IMPLANT
GOWN STRL REUS W/ TWL XL LVL3 (GOWN DISPOSABLE) ×2 IMPLANT
GOWN STRL REUS W/TWL LRG LVL3 (GOWN DISPOSABLE)
GOWN STRL REUS W/TWL XL LVL3 (GOWN DISPOSABLE) ×4
HANDPIECE INTERPULSE COAX TIP (DISPOSABLE) ×2
IMMOBILIZER KNEE 22 UNIV (SOFTGOODS) ×3 IMPLANT
KIT BASIN OR (CUSTOM PROCEDURE TRAY) ×3 IMPLANT
KIT TURNOVER KIT B (KITS) ×3 IMPLANT
KNEE PATELLA ASYMMETRIC 9X29 (Knees) ×3 IMPLANT
KNEE TIBIAL COMPONENT SZ3 (Knees) ×3 IMPLANT
MANIFOLD NEPTUNE II (INSTRUMENTS) ×3 IMPLANT
NDL SAFETY ECLIPSE 18X1.5 (NEEDLE) IMPLANT
NEEDLE HYPO 18GX1.5 SHARP (NEEDLE)
NS IRRIG 1000ML POUR BTL (IV SOLUTION) ×3 IMPLANT
PACK TOTAL JOINT (CUSTOM PROCEDURE TRAY) ×3 IMPLANT
PAD ARMBOARD 7.5X6 YLW CONV (MISCELLANEOUS) ×3 IMPLANT
PAD CAST 4YDX4 CTTN HI CHSV (CAST SUPPLIES) ×1 IMPLANT
PADDING CAST COTTON 4X4 STRL (CAST SUPPLIES) ×2
PADDING CAST COTTON 6X4 STRL (CAST SUPPLIES) ×3 IMPLANT
POSTERIOR FEMORAL SZ4 RT (Femur) ×3 IMPLANT
SET HNDPC FAN SPRY TIP SCT (DISPOSABLE) ×1 IMPLANT
SET PAD KNEE POSITIONER (MISCELLANEOUS) ×3 IMPLANT
STAPLER VISISTAT 35W (STAPLE) ×3 IMPLANT
STRIP CLOSURE SKIN 1/2X4 (GAUZE/BANDAGES/DRESSINGS) IMPLANT
SUCTION FRAZIER HANDLE 10FR (MISCELLANEOUS) ×2
SUCTION TUBE FRAZIER 10FR DISP (MISCELLANEOUS) ×1 IMPLANT
SUT MNCRL AB 4-0 PS2 18 (SUTURE) IMPLANT
SUT VIC AB 0 CT1 27 (SUTURE) ×2
SUT VIC AB 0 CT1 27XBRD ANBCTR (SUTURE) ×1 IMPLANT
SUT VIC AB 1 CT1 27 (SUTURE) ×4
SUT VIC AB 1 CT1 27XBRD ANBCTR (SUTURE) ×2 IMPLANT
SUT VIC AB 2-0 CT1 27 (SUTURE) ×4
SUT VIC AB 2-0 CT1 TAPERPNT 27 (SUTURE) ×2 IMPLANT
SYR 50ML LL SCALE MARK (SYRINGE) IMPLANT
TOWEL OR 17X24 6PK STRL BLUE (TOWEL DISPOSABLE) ×3 IMPLANT
TOWEL OR 17X26 10 PK STRL BLUE (TOWEL DISPOSABLE) ×3 IMPLANT
TRAY CATH 16FR W/PLASTIC CATH (SET/KITS/TRAYS/PACK) IMPLANT
WRAP KNEE MAXI GEL POST OP (GAUZE/BANDAGES/DRESSINGS) ×3 IMPLANT

## 2018-05-29 NOTE — Brief Op Note (Signed)
05/29/2018  3:40 PM  PATIENT:  Kayla Miles  67 y.o. female  PRE-OPERATIVE DIAGNOSIS:  RIGHT  knee osteoarthritis  POST-OPERATIVE DIAGNOSIS:  RIGHT  knee osteoarthritis  PROCEDURE:  Procedure(s): RIGHT  TOTAL KNEE ARTHROPLASTY (Right)  SURGEON:  Surgeon(s) and Role:    [name], MD - Primary  PHYSICIAN ASSISTANT: Benita Stabile, PA-C  ANESTHESIA:   regional and spinal  COUNTS:  YES  TOURNIQUET:   Total Tourniquet Time Documented: Thigh (Right) - 48 minutes Total: Thigh (Right) - 48 minutes   DICTATION: .Other Dictation: Dictation Number [id number]  PLAN OF CARE: Admit to inpatient   PATIENT DISPOSITION:  PACU - hemodynamically stable.   Delay start of Pharmacological VTE agent (>24hrs) due to surgical blood loss or risk of bleeding: no

## 2018-05-29 NOTE — H&P (Signed)
TOTAL KNEE ADMISSION H&P  Patient is being admitted for right total knee arthroplasty.  Subjective:  Chief Complaint:right knee pain.  HPI: Kayla Miles, 67 y.o. female, has a history of pain and functional disability in the right knee due to arthritis and has failed non-surgical conservative treatments for greater than 12 weeks to includeNSAID's and/or analgesics, corticosteriod injections, viscosupplementation injections, flexibility and strengthening excercises, use of assistive devices, weight reduction as appropriate and activity modification.  Onset of symptoms was gradual, starting 2 years ago with gradually worsening course since that time. The patient noted no past surgery on the right knee(s).  Patient currently rates pain in the right knee(s) at 10 out of 10 with activity. Patient has night pain, worsening of pain with activity and weight bearing, pain that interferes with activities of daily living, pain with passive range of motion, crepitus and joint swelling.  Patient has evidence of subchondral sclerosis, periarticular osteophytes and joint space narrowing by imaging studies. There is no active infection.  Patient Active Problem List   Diagnosis Date Noted  . Primary osteoarthritis of left knee 04/11/2018  . Unilateral primary osteoarthritis, right knee 03/07/2018  . Chronic pain of right knee 03/07/2018  . Chronic pain of left knee 03/07/2018  . Malignant neoplasm of central portion of left female breast ([place]) 04/16/2014   Past Medical History:  Diagnosis Date  . Arthralgia of knee, right   . Arthritis    left knee osteoarthritis   . Breast cancer ([place]) 03/26/14   Left  . HTN (hypertension)   . Hypothyroidism   . Pre-diabetes     Past Surgical History:  Procedure Laterality Date  . BREAST IMPLANT EXCHANGE Bilateral 05/03/2018  . MASTECTOMY Bilateral 2015   with reconstruction.     Current Facility-Administered Medications  Medication Dose Route  Frequency Provider Last Rate Last Dose  . ceFAZolin (ANCEF) 2-4 GM/100ML-% IVPB           . ceFAZolin (ANCEF) IVPB 2g/100 mL premix  2 g Intravenous On Call to OR [name], MD      . lactated ringers infusion   Intravenous Continuous Hodierne, Adam, MD      . tranexamic acid (CYKLOKAPRON) 1,000 mg in sodium chloride 0.9 % 100 mL IVPB  1,000 mg Intravenous To OR [name], MD       Allergies  Allergen Reactions  . Contrast Media [Iodinated Diagnostic Agents] Other (See Comments)    Throat swelling prohance   . Prohance [Gadoteridol] Other (See Comments)    Throat swelling  . Codeine Nausea And Vomiting    Severe N/V    Social History   Tobacco Use  . Smoking status: Never Smoker  . Smokeless tobacco: Never Used  Substance Use Topics  . Alcohol use: No    Family History  Problem Relation Age of Onset  . Colon cancer Father 60  . Colon cancer Paternal Uncle      Review of Systems  Musculoskeletal: Positive for joint pain.  All other systems reviewed and are negative.   Objective:  Physical Exam  Constitutional: She is oriented to person, place, and time. She appears well-developed and well-nourished.  HENT:  Head: Normocephalic and atraumatic.  Eyes: Pupils are equal, round, and reactive to light. EOM are normal.  Neck: Normal range of motion. Neck supple.  Cardiovascular: Normal rate and regular rhythm.  Respiratory: Effort normal and breath sounds normal.  GI: Soft. Bowel sounds are normal.  Musculoskeletal:  Right knee: She exhibits decreased range of motion, effusion and abnormal alignment. Tenderness found. Medial joint line and lateral joint line tenderness noted.  Neurological: She is alert and oriented to person, place, and time.  Skin: Skin is warm and dry.  Psychiatric: She has a normal mood and affect.    Vital signs in last 24 hours: Temp:  [98.5 F (36.9 C)] 98.5 F (36.9 C) (06/18 1142) Pulse Rate:  [73] 73 (06/18  1142) Resp:  [20] 20 (06/18 1142) BP: (146)/(82) 146/82 (06/18 1142) SpO2:  [97 %] 97 % (06/18 1142) Weight:  [191 lb (86.6 kg)] 191 lb (86.6 kg) (06/18 1142)  Labs:   Estimated body mass index is 31.78 kg/m as calculated from the following:   Height as of this encounter: 5\' 5"  (1.651 m).   Weight as of this encounter: 191 lb (86.6 kg).   Imaging Review Plain radiographs demonstrate severe degenerative joint disease of the right knee(s). The overall alignment ismild varus. The bone quality appears to be good for age and reported activity level.   Preoperative templating of the joint replacement has been completed, documented, and submitted to the Operating Room personnel in order to optimize intra-operative equipment management.   Anticipated LOS equal to or greater than 2 midnights due to - Age 58 and older with one or more of the following:  - Obesity  - Expected need for hospital services (PT, OT, Nursing) required for safe  discharge  - Anticipated need for postoperative skilled nursing care or inpatient rehab  - Active co-morbidities: None OR   - Unanticipated findings during/Post Surgery: Slow post-op progression: GI, pain control, mobility  - Patient is a high risk of re-admission due to: None     Assessment/Plan:  End stage arthritis, right knee   The patient history, physical examination, clinical judgment of the provider and imaging studies are consistent with end stage degenerative joint disease of the right knee(s) and total knee arthroplasty is deemed medically necessary. The treatment options including medical management, injection therapy arthroscopy and arthroplasty were discussed at length. The risks and benefits of total knee arthroplasty were presented and reviewed. The risks due to aseptic loosening, infection, stiffness, patella tracking problems, thromboembolic complications and other imponderables were discussed. The patient acknowledged the explanation,  agreed to proceed with the plan and consent was signed. Patient is being admitted for inpatient treatment for surgery, pain control, PT, OT, prophylactic antibiotics, VTE prophylaxis, progressive ambulation and ADL's and discharge planning. The patient is planning to be discharged home with home health services

## 2018-05-29 NOTE — Progress Notes (Signed)
Orthopedic Tech Progress Note Patient Details:  Kayla Miles 1951/09/23 [id number]      Post Interventions Patient Tolerated: Well Instructions Provided: Care of device   Braulio Bosch 05/29/2018, 5:14 PM

## 2018-05-29 NOTE — Transfer of Care (Signed)
Immediate Anesthesia Transfer of Care Note  Patient: Vilma Meckel Neal-Ziglar  Procedure(s) Performed: RIGHT  TOTAL KNEE ARTHROPLASTY (Right Knee)  Patient Location: PACU  Anesthesia Type:MAC, Regional and Spinal  Level of Consciousness: awake, alert , oriented and patient cooperative  Airway & Oxygen Therapy: Patient Spontanous Breathing and Patient connected to nasal cannula oxygen  Post-op Assessment: Report given to RN and Post -op Vital signs reviewed and stable  Post vital signs: Reviewed and stable  Last Vitals:  Vitals Value Taken Time  BP 93/64 05/29/2018  4:07 PM  Temp 36.2 C 05/29/2018  4:07 PM  Pulse 72 05/29/2018  4:10 PM  Resp 21 05/29/2018  4:10 PM  SpO2 95 % 05/29/2018  4:10 PM  Vitals shown include unvalidated device data.  Last Pain:  Vitals:   05/29/18 1607  TempSrc:   PainSc: (P) 0-No pain         Complications: No apparent anesthesia complications

## 2018-05-29 NOTE — Anesthesia Procedure Notes (Signed)
Spinal  Patient location during procedure: OR Start time: 05/29/2018 2:05 PM End time: 05/29/2018 2:07 PM Staffing Anesthesiologist: Albertha Ghee, MD Performed: anesthesiologist  Preanesthetic Checklist Completed: patient identified, surgical consent, pre-op evaluation, timeout performed, IV checked, risks and benefits discussed and monitors and equipment checked Spinal Block Patient position: sitting Prep: DuraPrep Patient monitoring: cardiac monitor, continuous pulse ox and blood pressure Approach: midline Location: L3-4 Injection technique: single-shot Needle Needle type: Pencan  Needle gauge: 24 G Needle length: 9 cm Assessment Sensory level: T10 Additional Notes Functioning IV was confirmed and monitors were applied. Sterile prep and drape, including hand hygiene and sterile gloves were used. The patient was positioned and the spine was prepped. The skin was anesthetized with lidocaine.  Free flow of clear CSF was obtained prior to injecting local anesthetic into the CSF.  The spinal needle aspirated freely following injection.  The needle was carefully withdrawn.  The patient tolerated the procedure well.

## 2018-05-29 NOTE — Progress Notes (Signed)
Pt and Daughter requested change of oxycodone PRN pain medicine to Oxycotin since the oxycodone is not working for her, around 2015 this shift, nurse called attending physician who happens to be Dr Heinz Knuckles.  Dr. Hulen Skains Pt. And talked to her and her daughter, Pt daughter was so rude to the doctor on the phone,, she yells at Dr. Heinz Knuckles and called him a 'JERK", ( witnessed by the nurse). patient already had 10mg  of 0xycodone at 1826,  And dilaudid at [place],  I have explained when the next dose of PRNs pain meds will be due, call light within reach , will continue to monitor.

## 2018-05-29 NOTE — Anesthesia Preprocedure Evaluation (Signed)
Anesthesia Evaluation  Patient identified by MRN, date of birth, ID band Patient awake    Reviewed: Allergy & Precautions, H&P , NPO status , Patient's Chart, lab work & pertinent test results  Airway Mallampati: II   Neck ROM: full    Dental   Pulmonary neg pulmonary ROS,    breath sounds clear to auscultation       Cardiovascular hypertension,  Rhythm:regular Rate:Normal     Neuro/Psych    GI/Hepatic   Endo/Other  Hypothyroidism   Renal/GU      Musculoskeletal  (+) Arthritis ,   Abdominal   Peds  Hematology   Anesthesia Other Findings   Reproductive/Obstetrics H/o breast CA                             Anesthesia Physical Anesthesia Plan  ASA: II  Anesthesia Plan: Spinal, Regional and MAC   Post-op Pain Management:  Regional for Post-op pain   Induction: Intravenous  PONV Risk Score and Plan: 2 and Ondansetron, Dexamethasone, Treatment may vary due to age or medical condition, Propofol infusion and Midazolam  Airway Management Planned: Simple Face Mask  Additional Equipment:   Intra-op Plan:   Post-operative Plan:   Informed Consent: I have reviewed the patients History and Physical, chart, labs and discussed the procedure including the risks, benefits and alternatives for the proposed anesthesia with the patient or authorized representative who has indicated his/her understanding and acceptance.     Plan Discussed with: CRNA, Anesthesiologist and Surgeon  Anesthesia Plan Comments:         Anesthesia Quick Evaluation

## 2018-05-29 NOTE — Anesthesia Procedure Notes (Signed)
Anesthesia Regional Block: Adductor canal block   Pre-Anesthetic Checklist: ,, timeout performed, Correct Patient, Correct Site, Correct Laterality, Correct Procedure, Correct Position, site marked, Risks and benefits discussed,  Surgical consent,  Pre-op evaluation,  At surgeon's request and post-op pain management  Laterality: Right  Prep: chloraprep       Needles:  Injection technique: Single-shot  Needle Type: Echogenic Needle     Needle Length: 9cm  Needle Gauge: 21     Additional Needles:   Narrative:  Start time: 05/29/2018 1:37 PM End time: 05/29/2018 1:41 PM Injection made incrementally with aspirations every 5 mL.  Performed by: Personally  Anesthesiologist: Albertha Ghee, MD  Additional Notes: Pt tolerated the procedure well.

## 2018-05-29 NOTE — Op Note (Signed)
NAMELAKERIA, Miles MEDICAL RECORD [id number] ACCOUNT [id number] DATE OF BIRTH:18-May-1951 FACILITY: [facility] LOCATION: [facility] PHYSICIAN:Mell Guia Kerry Fort, MD  OPERATIVE REPORT  DATE OF PROCEDURE:  05/29/2018  PREOPERATIVE DIAGNOSIS:  Primary osteoarthritis and  degenerative joint disease, right knee.  POSTOPERATIVE DIAGNOSIS:  Primary osteoarthritis an degenerative joint disease, right knee.  PROCEDURE:  Right total knee arthroplasty.  IMPLANTS:  Stryker Triathlon press-fit femur size 4, size 3 press-fit tibia tray, 11 millimeter fixed bearing polyethylene insert, size 29 press-fit patellar button.  SURGEON:  Kayla Miles.  Ninfa Linden, MD  ASSISTANT:  Erskine Emery, PA-C  ANESTHESIA:   1.  Right lower extremity adductor canal block. 2.  Spinal.  TOURNIQUET TIME:  Less than one hour.  ESTIMATED BLOOD LOSS:  100 mL.  ANTIBIOTICS:  Two grams IV Ancef.  COMPLICATIONS:  None.  INDICATIONS:  The patient is a 67 year old female well known to me.  She has debilitating arthritis involving both of her knees.  At this point, given the failure of conservative treatment, she wished to proceed with a right total knee arthroplasty.  She  understands the risk of acute blood loss anemia, nerve and vessel injury, fracture, infection and DVT including implant failure.  She understands our goals are to decrease pain and improve mobility and overall improved quality of life.  DESCRIPTION OF PROCEDURE:  After informed consent was obtained, the appropriate right knee was marked and anesthesia obtained, an adductor canal block in the holding room.  She was then brought to the operating room, sat up on the operating table.   Spinal anesthesia was obtained.  She was then placed in the supine position.  A nonsterile tourniquet was placed around the upper right thigh and the right leg was prepped and draped from the thigh down to the toes with DuraPrep and sterile drapes.  A  time-out  was called to identify correct patient, correct right knee.  We then used an Esmarch to wrap that leg.  The tourniquet was inflated to 300 mm of pressure.  We then made a direct midline incision over the patella and carried this proximally and  distally.  We dissected down the joint and carried out a  medial parapatellar arthrotomy following the moderate joint effusion.  She does have slight valgus malalignment.  We removed remnants of ACL, PCL, medial and lateral meniscus.  With the knee in  the flexed position, we set our tibia cutting guide using extramedullary guide, taking 9 mm off the high side, correcting varus and valgus in neutral slope.  We made this cut without difficulty.  We then went to the femur and used the intramedullary  drill for opening the notch of the femur for setting our distal femoral cutting guide for 5 degrees externally rotated for a size 8 mm distal femoral cut.  We made this cut without difficulty as well.  We then got the knee back down to full extension and  removed remnants of the medial and lateral meniscus and then placed a 9 mm extension block and achieved full extension.  We then went back to the femur and put our femoral sizing guide based off the epicondylar axis and chose a size 4 femur.  We put a 4  in 1 cutting block for a size 4 femur, made our anterior and posterior cuts followed by our chamfer cuts.  We then made our femoral box cut.  We then went to the tibia and chose a size 3 tibial tray for coverage, setting of rotation  of the tibial  tubercle and the femur and making our keel punch off of this.  With the size 3 trial tibia followed by the size 4 trial femur, we trialed the 11 mm fixed bearing poly insert and I was pleased with the range of motion and stability with that size.  We  then did our finishing tibia cut for press-fit tibia and made our patellar cut and drilled 3 holes for a size 29 press-fit patellar button.  We then removed all instrumentation from  the knee and irrigated the knee with normal saline solution using  pulsatile lavage.  We did then dried the knee thoroughly and placed our real pressfit Stryker Triathlon tibia tray, size 3, followed by a real size press-fit 4 femur.  We placed our real 11 millimeter fixed bearing polyethylene insert and placed our size  29 press-fit patellar button.  With all instrumentation in place, I put the knee through a range of motion.  I was pleased with stability.  We then let the tourniquet down.  Hemostasis was obtained with electrocautery.  We closed the arthrotomy with  interrupted #1 Vicryl suture followed by 0 Vicryl in the deep tissue, 2-0 Vicryl subcutaneous tissue and interrupted staples on the skin.  Xeroform well-padded sterile dressing was applied.    She was taken to recovery room in stable condition.    All final counts were correct.  No complications noted.    Of note, Benita Stabile, PA-C, assisted the entire case.  His assistance was crucial in facilitating all aspects of this case.  AN/NUANCE  D:05/29/2018 T:05/29/2018 JOB:[id number]

## 2018-05-30 ENCOUNTER — Telehealth (INDEPENDENT_AMBULATORY_CARE_PROVIDER_SITE_OTHER): Payer: Self-pay | Admitting: Orthopaedic Surgery

## 2018-05-30 ENCOUNTER — Other Ambulatory Visit: Payer: Self-pay

## 2018-05-30 ENCOUNTER — Encounter (HOSPITAL_COMMUNITY): Payer: Self-pay | Admitting: General Practice

## 2018-05-30 LAB — BASIC METABOLIC PANEL WITH GFR
Anion gap: 8 (ref 5–15)
BUN: 13 mg/dL (ref 6–20)
CO2: 24 mmol/L (ref 22–32)
Calcium: 8.7 mg/dL — ABNORMAL LOW (ref 8.9–10.3)
Chloride: 107 mmol/L (ref 101–111)
Creatinine, Ser: 0.99 mg/dL (ref 0.44–1.00)
GFR calc Af Amer: >60 mL/min
GFR calc non Af Amer: 58 mL/min — ABNORMAL LOW
Glucose, Bld: 142 mg/dL — ABNORMAL HIGH (ref 65–99)
Potassium: 4.6 mmol/L (ref 3.5–5.1)
Sodium: 139 mmol/L (ref 135–145)

## 2018-05-30 LAB — CBC
HCT: 35.7 % — ABNORMAL LOW (ref 36.0–46.0)
Hemoglobin: 11.3 g/dL — ABNORMAL LOW (ref 12.0–15.0)
MCH: 22.8 pg — ABNORMAL LOW (ref 26.0–34.0)
MCHC: 31.7 g/dL (ref 30.0–36.0)
MCV: 72 fL — ABNORMAL LOW (ref 78.0–100.0)
Platelets: 287 10*3/uL (ref 150–400)
RBC: 4.96 MIL/uL (ref 3.87–5.11)
RDW: 15.9 % — ABNORMAL HIGH (ref 11.5–15.5)
WBC: 12.3 10*3/uL — ABNORMAL HIGH (ref 4.0–10.5)

## 2018-05-30 NOTE — Telephone Encounter (Signed)
Please see below.

## 2018-05-30 NOTE — Evaluation (Signed)
Occupational Therapy Evaluation Patient Details Name: Kayla Miles MRN: [id number] DOB: December 31, 1950 Today's Date: 05/30/2018    History of Present Illness Pt is a 67 y/o female s/p R TKA. PMH including but not limited to L breast cancer and HTN.   Clinical Impression   Patient is able to perform transfer to commode at S level. Patient is S/setup for ADLs in sitting and standing without AE. Patient states she has son at home who is able to assist with care. Patient was educated on use of walker basket to increase safety with carrying objects while amb. Patient would benefit from [facility] if MD orders [facility] services. Patient does not have further Acute OT needs at this time. Patient states she feels safe with her ADLs and ADL transfers and does not need another OT visit in the hospital.     Follow Up Recommendations  Follow surgeon's recommendation for DC plan and follow-up therapies;Supervision/Assistance - 24 hour    Equipment Recommendations  3 in 1 bedside commode    Recommendations for Other Services       Precautions / Restrictions Precautions Precautions: Fall;Knee Precaution Comments: Reviewed positioning of LE following TKA sx with pt. Restrictions Weight Bearing Restrictions: Yes RLE Weight Bearing: Weight bearing as tolerated      Mobility Bed Mobility Overal bed mobility: Modified Independent             General bed mobility comments: increased time and effort, pt able to use bilateral UEs to assist R LE onto and off of bed  Transfers Overall transfer level: Needs assistance Equipment used: Rolling walker (2 wheeled) Transfers: Sit to/from Stand Sit to Stand: Min guard         General transfer comment: increased time and effort, cueing for safe hand placement, min guard for safety    Balance Overall balance assessment: Needs assistance Sitting-balance support: Feet supported Sitting balance-Leahy Scale: Good     Standing balance support: During  functional activity;Bilateral upper extremity supported Standing balance-Leahy Scale: Poor                             ADL either performed or assessed with clinical judgement   ADL Overall ADL's : Needs assistance/impaired     Grooming: Wash/dry hands;Wash/dry face;Standing   Upper Body Bathing: Set up;Sitting   Lower Body Bathing: Supervison/ safety;Set up;Sit to/from stand   Upper Body Dressing : Set up;Sitting   Lower Body Dressing: Supervision/safety;Set up;Sit to/from stand   Toilet Transfer: Supervision/safety;Ambulation   Toileting- Clothing Manipulation and Hygiene: Supervision/safety;Sit to/from stand       Functional mobility during ADLs: Supervision/safety;Rolling walker General ADL Comments: Patient was able to don socks and shoes without ae.     Vision Baseline Vision/History: Wears glasses Wears Glasses: At all times Patient Visual Report: (patient reports she has a retinol hole and needs to have sx.)       Perception     Praxis      Pertinent Vitals/Pain Pain Assessment: 0-10 Pain Score: 7  Faces Pain Scale: Hurts little more Pain Location: r knee Pain Descriptors / Indicators: Dull Pain Intervention(s): Monitored during session;Premedicated before session     Hand Dominance     Extremity/Trunk Assessment Upper Extremity Assessment Upper Extremity Assessment: Overall WFL for tasks assessed   Lower Extremity Assessment Lower Extremity Assessment: RLE deficits/detail RLE Deficits / Details: pt with decreased strength and ROM limitations secondary to post-op pain and weakness. Sensation grossly  intact.   Cervical / Trunk Assessment Cervical / Trunk Assessment: Normal   Communication Communication Communication: No difficulties   Cognition Arousal/Alertness: Awake/alert Behavior During Therapy: WFL for tasks assessed/performed Overall Cognitive Status: Within Functional Limits for tasks assessed                                      General Comments       Exercises     Shoulder Instructions      Home Living Family/patient expects to be discharged to:: Private residence Living Arrangements: Children Available Help at Discharge: Family;Available 24 hours/day Type of Home: House Home Access: Level entry     Home Layout: Two level;Bed/bath upstairs Alternate Level Stairs-Number of Steps: flight (13) Alternate Level Stairs-Rails: Right;Left Bathroom Shower/Tub: Tub/shower unit;Walk-in shower   Bathroom Toilet: Standard     Home Equipment: None          Prior Functioning/Environment Level of Independence: Independent                 OT Problem List:        OT Treatment/Interventions:      OT Goals(Current goals can be found in the care plan section) Acute Rehab OT Goals Patient Stated Goal: go home  OT Frequency:     Barriers to D/C:            Co-evaluation              AM-PAC PT "6 Clicks" Daily Activity     Outcome Measure Help from another person eating meals?: None Help from another person taking care of personal grooming?: A Little Help from another person toileting, which includes using toliet, bedpan, or urinal?: A Little Help from another person bathing (including washing, rinsing, drying)?: A Little Help from another person to put on and taking off regular upper body clothing?: A Little Help from another person to put on and taking off regular lower body clothing?: A Little 6 Click Score: 19   End of Session Equipment Utilized During Treatment: Gait belt;Rolling walker CPM Right Knee CPM Right Knee: Off  Activity Tolerance: Patient tolerated treatment well Patient left: in bed;with call bell/phone within reach                   Time: 1239-1310 OT Time Calculation (min): 31 min Charges:  OT General Charges $OT Visit: 1 Visit OT Evaluation $OT Eval Low Complexity: 1 Low OT Treatments $Self Care/Home Management : 8-22 mins G-Codes:      6 clicks  Colston Pyle 05/30/2018, 1:15 PM

## 2018-05-30 NOTE — Telephone Encounter (Signed)
I called and left her message telling her I appreciated her call.

## 2018-05-30 NOTE — Telephone Encounter (Signed)
Patient's daughter called this morning to apologize for the way she acted last night toward Dr. Lorin Mercy.  She said he did not deserve the way she acted towards him.  She would like to apologize to him over the phone and would like for him to call her when he gets a chance.  Thank you.  CB#[phone].

## 2018-05-30 NOTE — Progress Notes (Signed)
Physical Therapy Treatment Patient Details Name: Kayla Miles MRN: [id number] DOB: [birth year] Today's Date: 05/30/2018    History of Present Illness Pt is a 67 y/o female s/p R TKA. PMH including but not limited to L breast cancer and HTN.    PT Comments    Pt making steady progress with functional mobility and tolerated increased ambulation distance this session. Pt remains limited secondary to pain, nausea and emesis. Pt would continue to benefit from skilled physical therapy services at this time while admitted and after d/c to address the below listed limitations in order to improve overall safety and independence with functional mobility.    Follow Up Recommendations  Home health PT;Supervision/Assistance - 24 hour     Equipment Recommendations  Rolling walker with 5" wheels;3in1 (PT)    Recommendations for Other Services       Precautions / Restrictions Precautions Precautions: Fall;Knee Precaution Comments: Reviewed positioning of LE following TKA sx with pt. Restrictions Weight Bearing Restrictions: Yes RLE Weight Bearing: Weight bearing as tolerated    Mobility  Bed Mobility Overal bed mobility: Modified Independent             General bed mobility comments: increased time and effort, pt able to use bilateral UEs to assist R LE onto and off of bed  Transfers Overall transfer level: Needs assistance Equipment used: Rolling walker (2 wheeled) Transfers: Sit to/from Stand Sit to Stand: Min guard         General transfer comment: increased time and effort, good technique, min guard for safety  Ambulation/Gait Ambulation/Gait assistance: Min guard Gait Distance (Feet): 100 Feet Assistive device: Rolling walker (2 wheeled) Gait Pattern/deviations: Step-to pattern;Decreased step length - left;Decreased stance time - right;Decreased stride length;Decreased weight shift to right Gait velocity: decreased Gait velocity interpretation: 1.31 - 2.62  ft/sec, indicative of limited community ambulator General Gait Details: pt with slow, cautious but steady gait with RW, min guard for safety. Pt improving with gait speed   Stairs             Wheelchair Mobility    Modified Rankin (Stroke Patients Only)       Balance Overall balance assessment: Needs assistance Sitting-balance support: Feet supported Sitting balance-Leahy Scale: Good     Standing balance support: During functional activity;Bilateral upper extremity supported Standing balance-Leahy Scale: Poor                              Cognition Arousal/Alertness: Awake/alert Behavior During Therapy: WFL for tasks assessed/performed Overall Cognitive Status: Within Functional Limits for tasks assessed                                        Exercises Total Joint Exercises Goniometric ROM: Flexion ~60 degrees; Extension lacking ~10 degrees to neutral    General Comments        Pertinent Vitals/Pain Pain Assessment: Faces Pain Score: 7  Faces Pain Scale: Hurts little more Pain Location: R knee Pain Descriptors / Indicators: Burning Pain Intervention(s): Monitored during session;Repositioned    Home Living Family/patient expects to be discharged to:: Private residence Living Arrangements: Children Available Help at Discharge: Family;Available 24 hours/day Type of Home: House Home Access: Level entry   Home Layout: Two level;Bed/bath upstairs Home Equipment: None      Prior Function Level of Independence: Independent  PT Goals (current goals can now be found in the care plan section) Acute Rehab PT Goals Patient Stated Goal: go home PT Goal Formulation: With patient Time For Goal Achievement: 06/13/18 Potential to Achieve Goals: Good Progress towards PT goals: Progressing toward goals    Frequency    7X/week      PT Plan Current plan remains appropriate    Co-evaluation              AM-PAC  PT "6 Clicks" Daily Activity  Outcome Measure  Difficulty turning over in bed (including adjusting bedclothes, sheets and blankets)?: None Difficulty moving from lying on back to sitting on the side of the bed? : None Difficulty sitting down on and standing up from a chair with arms (e.g., wheelchair, bedside commode, etc,.)?: Unable Help needed moving to and from a bed to chair (including a wheelchair)?: A Little Help needed walking in hospital room?: A Little Help needed climbing 3-5 steps with a railing? : A Little 6 Click Score: 18    End of Session Equipment Utilized During Treatment: Gait belt Activity Tolerance: Other (comment)(limited secondary to nausea and emesis x 1) Patient left: in bed;with call bell/phone within reach Nurse Communication: Mobility status PT Visit Diagnosis: Other abnormalities of gait and mobility (R26.89);Pain Pain - Right/Left: Right Pain - part of body: Knee     Time: 1455-1516 PT Time Calculation (min) (ACUTE ONLY): 21 min  Charges:  $Gait Training: 8-22 mins                    G Codes:       [place], [place], [place] 05/30/2018, 3:37 PM

## 2018-05-30 NOTE — Progress Notes (Signed)
Subjective: 1 Day Post-Op Procedure(s) (LRB): RIGHT  TOTAL KNEE ARTHROPLASTY (Right) Patient reports pain as moderate.    Objective: Vital signs in last 24 hours: Temp:  [97.2 F (36.2 C)-98.5 F (36.9 C)] 97.8 F (36.6 C) (06/19 0434) Pulse Rate:  [56-83] 78 (06/19 0434) Resp:  [2-24] 14 (06/19 0010) BP: (86-155)/(56-89) 122/85 (06/19 0434) SpO2:  [85 %-100 %] 85 % (06/19 0434) Weight:  [191 lb (86.6 kg)] 191 lb (86.6 kg) (06/18 1142)  Intake/Output from previous day: 06/18 0701 - 06/19 0700 In: 829 [P.O.:100; I.V.:679.1; IV Piggyback:49.9] Out: 1400 [Urine:1350; Blood:50] Intake/Output this shift: No intake/output data recorded.  Recent Labs    05/30/18 0359  HGB 11.3*   Recent Labs    05/30/18 0359  WBC 12.3*  RBC 4.96  HCT 35.7*  PLT 287   Recent Labs    05/30/18 0359  NA 139  K 4.6  CL 107  CO2 24  BUN 13  CREATININE 0.99  GLUCOSE 142*  CALCIUM 8.7*   No results for input(s): LABPT, INR in the last 72 hours.  Sensation intact distally Intact pulses distally Dorsiflexion/Plantar flexion intact Incision: dressing C/D/I Compartment soft  Anticipated LOS equal to or greater than 2 midnights due to - Age 67 and older with one or more of the following:  - Obesity  - Expected need for hospital services (PT, OT, Nursing) required for safe  discharge  - Anticipated need for postoperative skilled nursing care or inpatient rehab  - Active co-morbidities: None OR   - Unanticipated findings during/Post Surgery: None  - Patient is a high risk of re-admission due to: None   Assessment/Plan: 1 Day Post-Op Procedure(s) (LRB): RIGHT  TOTAL KNEE ARTHROPLASTY (Right) Up with therapy    Kayla Miles 05/30/2018, 7:55 AM

## 2018-05-30 NOTE — Anesthesia Postprocedure Evaluation (Signed)
Anesthesia Post Note  Patient: Kayla Miles  Procedure(s) Performed: RIGHT  TOTAL KNEE ARTHROPLASTY (Right Knee)     Patient location during evaluation: PACU Anesthesia Type: Regional, Spinal and MAC Level of consciousness: oriented and awake and alert Pain management: pain level controlled Vital Signs Assessment: post-procedure vital signs reviewed and stable Respiratory status: spontaneous breathing, respiratory function stable and patient connected to nasal cannula oxygen Cardiovascular status: blood pressure returned to baseline and stable Postop Assessment: no headache, no backache and no apparent nausea or vomiting Anesthetic complications: no    Last Vitals:  Vitals:   05/30/18 0010 05/30/18 0434  BP: 139/88 122/85  Pulse: 83 78  Resp: 14   Temp: 36.8 C 36.6 C  SpO2: 96% (!) 85%    Last Pain:  Vitals:   05/30/18 0446  TempSrc:   PainSc: [place]

## 2018-05-30 NOTE — Evaluation (Signed)
Physical Therapy Evaluation Patient Details Name: Kayla Miles MRN: [id number] DOB: [birth year] Today's Date: 05/30/2018   History of Present Illness  Pt is a 67 y/o female s/p R TKA. PMH including but not limited to L breast cancer and HTN.  Clinical Impression  Pt presented supine in bed with HOB elevated, awake and willing to participate in therapy session. Prior to admission, pt reported that she was independent with all functional mobility and ADLs. Pt will have her son at home with her 24/7 upon d/c. Pt has no steps to enter her home but has a full flight to get to her bedroom and bathroom. Pt will need to participate in stair training prior to d/c. Pt currently able to perform bed mobility with modified independence, transfers with min guard and short distance ambulation with min guard and use of RW. Pt limited this session secondary to two bouts of emesis. Pt's RN was notified. Pt would continue to benefit from skilled physical therapy services at this time while admitted and after d/c to address the below listed limitations in order to improve overall safety and independence with functional mobility.     Follow Up Recommendations Home health PT;Supervision/Assistance - 24 hour    Equipment Recommendations  Rolling walker with 5" wheels;3in1 (PT)    Recommendations for Other Services       Precautions / Restrictions Precautions Precautions: Fall;Knee Precaution Comments: Reviewed positioning of LE following TKA sx with pt. Restrictions Weight Bearing Restrictions: Yes RLE Weight Bearing: Weight bearing as tolerated      Mobility  Bed Mobility Overal bed mobility: Modified Independent             General bed mobility comments: increased time and effort, pt able to use bilateral UEs to assist R LE onto and off of bed  Transfers Overall transfer level: Needs assistance Equipment used: Rolling walker (2 wheeled) Transfers: Sit to/from Stand Sit to Stand: Min  guard         General transfer comment: increased time and effort, cueing for safe hand placement, min guard for safety  Ambulation/Gait Ambulation/Gait assistance: Min guard Gait Distance (Feet): 25 Feet Assistive device: Rolling walker (2 wheeled) Gait Pattern/deviations: Step-to pattern;Decreased step length - left;Decreased stance time - right;Decreased stride length;Decreased weight shift to right Gait velocity: decreased Gait velocity interpretation: <1.31 ft/sec, indicative of household ambulator General Gait Details: pt with slow, cautious but steady gait with RW, min guard for safety. Pt limited secondary to nausea and two bouts of emesis at end of ambulation  Stairs            Wheelchair Mobility    Modified Rankin (Stroke Patients Only)       Balance Overall balance assessment: Needs assistance Sitting-balance support: Feet supported Sitting balance-Leahy Scale: Good     Standing balance support: During functional activity;Bilateral upper extremity supported Standing balance-Leahy Scale: Poor                               Pertinent Vitals/Pain Pain Assessment: Faces Faces Pain Scale: Hurts little more Pain Location: R knee Pain Descriptors / Indicators: Sore Pain Intervention(s): Monitored during session;Repositioned;Patient requesting pain meds-RN notified    Home Living Family/patient expects to be discharged to:: Private residence Living Arrangements: Children Available Help at Discharge: Family;Available 24 hours/day Type of Home: House Home Access: Level entry     Home Layout: Two level;Bed/bath upstairs Home Equipment: None  Prior Function Level of Independence: Independent               Hand Dominance        Extremity/Trunk Assessment   Upper Extremity Assessment Upper Extremity Assessment: Overall WFL for tasks assessed    Lower Extremity Assessment Lower Extremity Assessment: RLE deficits/detail RLE  Deficits / Details: pt with decreased strength and ROM limitations secondary to post-op pain and weakness. Sensation grossly intact.    Cervical / Trunk Assessment Cervical / Trunk Assessment: Normal  Communication   Communication: No difficulties  Cognition Arousal/Alertness: Awake/alert Behavior During Therapy: WFL for tasks assessed/performed Overall Cognitive Status: Within Functional Limits for tasks assessed                                        General Comments      Exercises     Assessment/Plan    PT Assessment Patient needs continued PT services  PT Problem List Decreased strength;Decreased range of motion;Decreased activity tolerance;Decreased balance;Decreased mobility;Decreased coordination;Decreased knowledge of use of DME;Decreased safety awareness;Decreased knowledge of precautions;Pain       PT Treatment Interventions DME instruction;Gait training;Stair training;Functional mobility training;Therapeutic activities;Therapeutic exercise;Balance training;Neuromuscular re-education;Patient/family education    PT Goals (Current goals can be found in the Care Plan section)  Acute Rehab PT Goals Patient Stated Goal: decrease pain PT Goal Formulation: With patient Time For Goal Achievement: 06/13/18 Potential to Achieve Goals: Good    Frequency 7X/week   Barriers to discharge        Co-evaluation               AM-PAC PT "6 Clicks" Daily Activity  Outcome Measure Difficulty turning over in bed (including adjusting bedclothes, sheets and blankets)?: None Difficulty moving from lying on back to sitting on the side of the bed? : None Difficulty sitting down on and standing up from a chair with arms (e.g., wheelchair, bedside commode, etc,.)?: Unable Help needed moving to and from a bed to chair (including a wheelchair)?: A Little Help needed walking in hospital room?: A Little Help needed climbing 3-5 steps with a railing? : A Little 6  Click Score: 18    End of Session Equipment Utilized During Treatment: Gait belt Activity Tolerance: Other (comment)(treatment limited secondary to emesis - RN notified) Patient left: in bed;with call bell/phone within reach Nurse Communication: Mobility status PT Visit Diagnosis: Other abnormalities of gait and mobility (R26.89);Pain Pain - Right/Left: Right Pain - part of body: Knee    Time: 5409-8119 PT Time Calculation (min) (ACUTE ONLY): 19 min   Charges:   PT Evaluation $PT Eval Moderate Complexity: 1 Mod     PT G Codes:        Bryant, PT, DPT [place] 05/30/2018, 10:45 AM

## 2018-05-31 MED ORDER — OXYCODONE HCL 5 MG PO TABS: 5.0000 mg | ORAL_TABLET | ORAL | 0 refills | Status: DC | PRN

## 2018-05-31 MED ORDER — METHOCARBAMOL 500 MG PO TABS: 500.0000 mg | ORAL_TABLET | Freq: Four times a day (QID) | ORAL | 1 refills | Status: DC | PRN

## 2018-05-31 MED ORDER — ONDANSETRON HCL 4 MG PO TABS: 4.0000 mg | ORAL_TABLET | Freq: Four times a day (QID) | ORAL | 0 refills | Status: DC | PRN

## 2018-05-31 MED ORDER — ASPIRIN 325 MG PO TBEC: 325.0000 mg | DELAYED_RELEASE_TABLET | Freq: Every day | ORAL | 0 refills | Status: DC

## 2018-05-31 NOTE — Discharge Summary (Signed)
Patient ID: Kayla Miles MRN: [id number] DOB/AGE: 06/23/1951 67 y.o.  Admit date: 05/29/2018 Discharge date: 05/31/2018  Admission Diagnoses:  Principal Problem:   Unilateral primary osteoarthritis, right knee Active Problems:   Status post revision of total knee, right   Discharge Diagnoses:  Same  Past Medical History:  Diagnosis Date  . Arthralgia of knee, right   . Arthritis    left knee osteoarthritis   . Breast cancer ([place]) 03/26/14   Left  . HTN (hypertension)   . Hypothyroidism   . Pre-diabetes     Surgeries: Procedure(s): RIGHT  TOTAL KNEE ARTHROPLASTY on 05/29/2018   Consultants:   Discharged Condition: Improved  Hospital Course: Kayla Miles is an 67 y.o. female who was admitted 05/29/2018 for operative treatment ofUnilateral primary osteoarthritis, right knee. Patient has severe unremitting pain that affects sleep, daily activities, and work/hobbies. After pre-op clearance the patient was taken to the operating room on 05/29/2018 and underwent  Procedure(s): RIGHT  TOTAL KNEE ARTHROPLASTY.    Patient was given perioperative antibiotics:  Anti-infectives (From admission, onward)   Start     Dose/Rate Route Frequency Ordered Stop   05/29/18 2000  ceFAZolin (ANCEF) IVPB 1 g/50 mL premix     1 g 100 mL/hr over 30 Minutes Intravenous Every 6 hours 05/29/18 1718 05/30/18 0230   05/29/18 1230  ceFAZolin (ANCEF) IVPB 2g/100 mL premix     2 g 200 mL/hr over 30 Minutes Intravenous On call to O.R. 05/29/18 1131 05/29/18 1416   05/29/18 1140  ceFAZolin (ANCEF) 2-4 GM/100ML-% IVPB    Note to Pharmacy:  Marga Melnick   : cabinet override      05/29/18 1140 05/29/18 1416       Patient was given sequential compression devices, early ambulation, and chemoprophylaxis to prevent DVT.  Patient benefited maximally from hospital stay and there were no complications.    Recent vital signs:  Patient Vitals for the past 24 hrs:  BP Temp Temp src Pulse  Resp SpO2  05/31/18 0500 (!) 152/76 (!) 100.4 F (38 C) Oral 88 16 96 %  05/30/18 1945 (!) 150/70 98.6 F (37 C) Oral 86 16 97 %  05/30/18 1500 (!) 142/73 97.6 F (36.4 C) Oral 85 - 94 %     Recent laboratory studies:  Recent Labs    05/30/18 0359  WBC 12.3*  HGB 11.3*  HCT 35.7*  PLT 287  NA 139  K 4.6  CL 107  CO2 24  BUN 13  CREATININE 0.99  GLUCOSE 142*  CALCIUM 8.7*     Discharge Medications:   Allergies as of 05/31/2018      Reactions   Contrast Media [iodinated Diagnostic Agents] Other (See Comments)   Throat swelling prohance   Prohance [gadoteridol] Other (See Comments)   Throat swelling   Codeine Nausea And Vomiting   Severe N/V      Medication List    TAKE these medications   amLODipine 5 MG tablet Commonly known as:  NORVASC Take 5 mg by mouth every other day. In the morning.   aspirin 325 MG EC tablet Take 1 tablet (325 mg total) by mouth daily with breakfast. Start taking on:  06/01/2018   levothyroxine 88 MCG tablet Commonly known as:  SYNTHROID, LEVOTHROID Take 88 mcg by mouth daily before breakfast.   methocarbamol 500 MG tablet Commonly known as:  ROBAXIN Take 1 tablet (500 mg total) by mouth every 6 (six) hours as needed for muscle spasms.  ondansetron 4 MG tablet Commonly known as:  ZOFRAN Take 1 tablet (4 mg total) by mouth every 6 (six) hours as needed for nausea.   oxyCODONE 5 MG immediate release tablet Commonly known as:  Oxy IR/ROXICODONE Take 1-2 tablets (5-10 mg total) by mouth every 4 (four) hours as needed for moderate pain (pain score 4-6).   VITAMIN D3 PO Take 1 tablet by mouth daily.            Durable Medical Equipment  (From admission, onward)        Start     Ordered   05/29/18 1719  DME Walker rolling  Once    Question:  Patient needs a walker to treat with the following condition  Answer:  Status post revision of total knee, right   05/29/18 1718   05/29/18 1719  DME 3 n 1  Once     05/29/18 1718       Diagnostic Studies: Dg Knee Right Port  Result Date: 05/29/2018 CLINICAL DATA:  Status post right total knee joint prosthesis placement. EXAM: PORTABLE RIGHT KNEE - 1-2 VIEW COMPARISON:  No recent studies in [facility] FINDINGS: The patient has undergone right total knee joint prosthesis placement. Radiographic positioning of the prosthetic components is good. The interface with the native bone appears normal. There is a small amount of fluid and air in the soft tissues of the anterior aspect of the joint. There surgical skin staples present. IMPRESSION: There is no immediate postprocedure complication following right total knee joint prosthesis placement. Electronically Signed   By: David  Martinique M.D.   On: 05/29/2018 16:52    Disposition: Discharge disposition: 01-Home or Self Care         Follow-up Information    [name], MD. Schedule an appointment as soon as possible for a visit in 2 week(s).   Specialty:  Orthopedic Surgery Contact information: [place] [address] [phone]            Signed: Erskine Emery 05/31/2018, 10:58 AM

## 2018-05-31 NOTE — Progress Notes (Signed)
Physical Therapy Treatment Patient Details Name: Kayla Miles MRN: [id number] DOB: 1951-02-14 Today's Date: 05/31/2018    History of Present Illness Pt is a 67 y/o female s/p R TKA. PMH including but not limited to L breast cancer and HTN.    PT Comments    Pt performed increased gait, she reviewed stair training for d/c home.  Pt reviewed supine exercises.  She continues to lack full extension in R knee, re-educated on resting in supported extension.  Plan for d/c home today.  Will f/u in pm if patient remains hospitalized.      Follow Up Recommendations  Home health PT;Supervision/Assistance - 24 hour     Equipment Recommendations  Rolling walker with 5" wheels;3in1 (PT)    Recommendations for Other Services       Precautions / Restrictions Precautions Precautions: Fall;Knee Precaution Comments: Reviewed positioning of LE following TKA sx with pt. Restrictions Weight Bearing Restrictions: Yes RLE Weight Bearing: Weight bearing as tolerated    Mobility  Bed Mobility Overal bed mobility: Modified Independent             General bed mobility comments: increased time and effort, pt able to use bilateral UEs to assist R LE onto and off of bed  Transfers Overall transfer level: Needs assistance Equipment used: Rolling walker (2 wheeled) Transfers: Sit to/from Stand Sit to Stand: Min guard         General transfer comment: increased time and effort, good technique, min guard for safety  Ambulation/Gait Ambulation/Gait assistance: Min guard Gait Distance (Feet): 200 Feet Assistive device: Rolling walker (2 wheeled) Gait Pattern/deviations: Step-to pattern;Decreased step length - left;Decreased stance time - right;Decreased stride length;Decreased weight shift to right;Step-through pattern;Trunk flexed Gait velocity: decreased   General Gait Details: Progression to step through pattern and cues for upper trunk control.     Stairs Stairs:  Yes Stairs assistance: Min guard Stair Management: No rails;One rail Right;With walker;Backwards;Sideways;Forwards Number of Stairs: 13(12 stairs with R rail.  1 curb with RW.  ) General stair comments: 12 stairs R rail with forward to ascend and trial of backwards to descend x3 and x9 sideways.  Pt performed curb trainign with RW.  Cues for sequencing and hand placement on railing as well as cueing for RW placement.     Wheelchair Mobility    Modified Rankin (Stroke Patients Only)       Balance     Sitting balance-Leahy Scale: Normal       Standing balance-Leahy Scale: Fair                              Cognition Arousal/Alertness: Awake/alert Behavior During Therapy: WFL for tasks assessed/performed Overall Cognitive Status: Within Functional Limits for tasks assessed                                        Exercises Total Joint Exercises Ankle Circles/Pumps: AROM;Both;20 reps;Supine Quad Sets: AROM;Right;10 reps;Supine Towel Squeeze: AROM;Both;10 reps;Supine Short Arc Quad: AROM;Right;10 reps;Supine Heel Slides: AROM;Right;10 reps;Supine Hip ABduction/ADduction: AROM;Right;10 reps;Supine Straight Leg Raises: AAROM;Right;10 reps;Supine Goniometric ROM: 11-62 degrees R knee AROM.    General Comments        Pertinent Vitals/Pain Pain Assessment: 0-10 Pain Score: 7  Faces Pain Scale: Hurts little more Pain Location: R knee Pain Intervention(s): Monitored during session;Repositioned;Ice applied    Home  Living                      Prior Function            PT Goals (current goals can now be found in the care plan section) Acute Rehab PT Goals Patient Stated Goal: go home Potential to Achieve Goals: Good Progress towards PT goals: Progressing toward goals    Frequency    7X/week      PT Plan Current plan remains appropriate    Co-evaluation              AM-PAC PT "6 Clicks" Daily Activity  Outcome  Measure  Difficulty turning over in bed (including adjusting bedclothes, sheets and blankets)?: Unable Difficulty moving from lying on back to sitting on the side of the bed? : Unable Difficulty sitting down on and standing up from a chair with arms (e.g., wheelchair, bedside commode, etc,.)?: Unable Help needed moving to and from a bed to chair (including a wheelchair)?: A Little Help needed walking in hospital room?: A Little Help needed climbing 3-5 steps with a railing? : A Little 6 Click Score: 12    End of Session Equipment Utilized During Treatment: Gait belt Activity Tolerance: Patient tolerated treatment well Patient left: in chair;with call bell/phone within reach Nurse Communication: Mobility status(pt is ready to d/c.  ) PT Visit Diagnosis: Other abnormalities of gait and mobility (R26.89);Pain Pain - Right/Left: Right     Time: 9643-8381 PT Time Calculation (min) (ACUTE ONLY): 38 min  Charges:  $Gait Training: 23-37 mins $Therapeutic Exercise: 8-22 mins                    G Codes:       Governor Rooks, PTA pager [phone]    Cristela Blue 05/31/2018, 11:35 AM

## 2018-05-31 NOTE — Plan of Care (Signed)
  Problem: Activity: Goal: Risk for activity intolerance will decrease Outcome: Adequate for Discharge   Problem: Pain Managment: Goal: General experience of comfort will improve Outcome: Adequate for Discharge   Problem: Safety: Goal: Ability to remain free from injury will improve Outcome: Adequate for Discharge   

## 2018-05-31 NOTE — Progress Notes (Signed)
Pt given discharge instructions and prescriptions. Instructions gone over with her and answered all questions to satisfaction. Equipment brought to room. All belongings gathered to be sent home. Pt in no distress at time of discharge.

## 2018-05-31 NOTE — Care Management Note (Signed)
Case Management Note  Patient Details  Name: Kayla Miles MRN: [id number] Date of Birth: August 20, 1951  Subjective/Objective:   67 yr old female s/p right total knee arthroplasty.                 Action/Plan: Patient was preoperatively setup with [facility], no changes. [facility] will need to get authorization from the [place] for [place]. Patient will have support at discharge.    Expected Discharge Date:  05/31/18               Expected Discharge Plan:  [place]  In-House Referral:  NA  Discharge planning Services  CM Consult  Post Acute Care Choice:  Durable Medical Equipment, Home Health Choice offered to:  Patient  DME Arranged:  3-N-1, Walker rolling DME Agency:  [place]:  PT [place] Agency:  [facility]  Status of Service:  Completed, signed off  If discussed at [employer], dates discussed:    Additional Comments:  Ninfa Meeker, RN 05/31/2018, 2:04 PM

## 2018-05-31 NOTE — Discharge Instructions (Signed)

## 2018-05-31 NOTE — Progress Notes (Signed)
Subjective: 2 Days Post-Op Procedure(s) (LRB): RIGHT  TOTAL KNEE ARTHROPLASTY (Right) Patient reports pain as moderate.  Some nausea yesterday reports improve with nausea medication. Decreased appetite persist .Otherwise ready for discharge to home later this afternoon.   Objective: Vital signs in last 24 hours: Temp:  [97.6 F (36.4 C)-100.4 F (38 C)] 100.4 F (38 C) (06/20 0500) Pulse Rate:  [85-88] 88 (06/20 0500) Resp:  [16] 16 (06/20 0500) BP: (142-152)/(70-76) 152/76 (06/20 0500) SpO2:  [94 %-97 %] 96 % (06/20 0500)  Intake/Output from previous day: 06/19 0701 - 06/20 0700 In: 840 [P.O.:840] Out: -  Intake/Output this shift: Total I/O In: 360 [P.O.:360] Out: -   Recent Labs    05/30/18 0359  HGB 11.3*   Recent Labs    05/30/18 0359  WBC 12.3*  RBC 4.96  HCT 35.7*  PLT 287   Recent Labs    05/30/18 0359  NA 139  K 4.6  CL 107  CO2 24  BUN 13  CREATININE 0.99  GLUCOSE 142*  CALCIUM 8.7*   No results for input(s): LABPT, INR in the last 72 hours.  Right lower leg: Sensation intact distally Dorsiflexion/Plantar flexion intact Incision: dressing C/D/I Compartment soft    Assessment/Plan: 2 Days Post-Op Procedure(s) (LRB): RIGHT  TOTAL KNEE ARTHROPLASTY (Right) Up with therapy Discharge home with home health later today.     Shauni Henner 05/31/2018, 10:48 AM

## 2018-06-04 ENCOUNTER — Telehealth (INDEPENDENT_AMBULATORY_CARE_PROVIDER_SITE_OTHER): Payer: Self-pay | Admitting: Orthopaedic Surgery

## 2018-06-04 NOTE — Telephone Encounter (Signed)
Kayla Miles with [facility] called advised she received a Rx for a CPM machine. She said [facility] no longer provide them. The number to contact Cecille Rubin is [phone]

## 2018-06-04 NOTE — Telephone Encounter (Signed)
Faxed Rx to this number

## 2018-06-04 NOTE — Telephone Encounter (Signed)
[place]  Fax [phone]

## 2018-06-04 NOTE — Telephone Encounter (Signed)
I am ok with a CPM if she wants one.

## 2018-06-04 NOTE — Telephone Encounter (Signed)
Patient called stated Dr. Ninfa Linden needs to send an order to [place] for CPM machine.  Please call patient to advise [phone]

## 2018-06-04 NOTE — Telephone Encounter (Signed)
See below

## 2018-06-04 NOTE — Telephone Encounter (Signed)
Faxed to [facility]  

## 2018-06-04 NOTE — Telephone Encounter (Signed)
Do you know where to send CPM Rx's these days?

## 2018-06-05 ENCOUNTER — Telehealth (INDEPENDENT_AMBULATORY_CARE_PROVIDER_SITE_OTHER): Payer: Self-pay | Admitting: Orthopaedic Surgery

## 2018-06-05 NOTE — Telephone Encounter (Signed)
Auth request faxed to [facility] for HHPT, Sonia Side advised.  Will follow up.

## 2018-06-05 NOTE — Telephone Encounter (Signed)
Needs a [place] auth for HHPT

## 2018-06-05 NOTE — Telephone Encounter (Signed)
HHPT auth

## 2018-06-05 NOTE — Telephone Encounter (Signed)
Sonia Side from [facility] called asked for a call back concerning patient. The number to contact Sonia Side is [phone]

## 2018-06-12 ENCOUNTER — Encounter (INDEPENDENT_AMBULATORY_CARE_PROVIDER_SITE_OTHER): Payer: Self-pay | Admitting: Orthopaedic Surgery

## 2018-06-12 ENCOUNTER — Ambulatory Visit (INDEPENDENT_AMBULATORY_CARE_PROVIDER_SITE_OTHER): Payer: PRIVATE HEALTH INSURANCE | Admitting: Orthopaedic Surgery

## 2018-06-12 DIAGNOSIS — Z96651 Presence of right artificial knee joint: Secondary | ICD-10-CM

## 2018-06-12 NOTE — Progress Notes (Signed)
The patient is 2 weeks status post a right total knee arthroplasty.  She is a patient of the [place] system.  They never approved her  home health therapy and they only just recently approved a CPM.  However she is making significant progress she is not taking any medications at all because the weather made her feel.  She denies any calf pain and she reports moderate knee pain.  On exam she has full extension of her right operative knee.  I remove the staples and placed Steri-Strips.  I can flex to 90 degrees.  Her calf is soft.  At this point she will try to transition outpatient physical therapy to get this approved to work on range of motion of her knee and bending the knee.  All questions concerns were answered and addressed.  She will stop her aspirin.  I will see her back in 4 weeks to see how she is doing overall but no x-rays are needed.

## 2018-06-13 ENCOUNTER — Other Ambulatory Visit (INDEPENDENT_AMBULATORY_CARE_PROVIDER_SITE_OTHER): Payer: Self-pay

## 2018-06-13 DIAGNOSIS — Z96651 Presence of right artificial knee joint: Secondary | ICD-10-CM

## 2018-06-13 NOTE — Telephone Encounter (Signed)
This is not a message for me.  Thank You.

## 2018-06-19 ENCOUNTER — Ambulatory Visit: Payer: Non-veteran care | Admitting: Physical Therapy

## 2018-06-20 ENCOUNTER — Telehealth (INDEPENDENT_AMBULATORY_CARE_PROVIDER_SITE_OTHER): Payer: Self-pay | Admitting: Orthopaedic Surgery

## 2018-06-20 NOTE — Telephone Encounter (Signed)
Can we add this to the referral please?

## 2018-06-20 NOTE — Telephone Encounter (Signed)
Has been added. Called patient that this has been done.

## 2018-06-20 NOTE — Telephone Encounter (Signed)
Patient called stated she needs her P.T. Referral along with the [facility]# [id number], please add this to the referral, faxed to [facility] physical therapy. Patient would like a call back when done [phone]

## 2018-06-21 ENCOUNTER — Ambulatory Visit: Payer: No Typology Code available for payment source | Attending: Physician Assistant | Admitting: Physical Therapy

## 2018-06-21 ENCOUNTER — Encounter: Payer: Self-pay | Admitting: Physical Therapy

## 2018-06-21 ENCOUNTER — Other Ambulatory Visit: Payer: Self-pay

## 2018-06-21 DIAGNOSIS — M25661 Stiffness of right knee, not elsewhere classified: Secondary | ICD-10-CM | POA: Diagnosis present

## 2018-06-21 DIAGNOSIS — R600 Localized edema: Secondary | ICD-10-CM | POA: Diagnosis present

## 2018-06-21 DIAGNOSIS — M6281 Muscle weakness (generalized): Secondary | ICD-10-CM

## 2018-06-21 DIAGNOSIS — M25561 Pain in right knee: Secondary | ICD-10-CM | POA: Diagnosis present

## 2018-06-21 NOTE — Therapy (Signed)
[facility], [place], [address] Phone: [phone]   Fax:  [phone]  Physical Therapy Evaluation  Patient Details  Name: Kayla Miles MRN: [id number] Date of Birth: [birth year] Referring Provider: Jean Rosenthal    Encounter Date: 06/21/2018  PT End of Session - 06/21/18 1152    Visit Number  1    Number of Visits  13    Date for PT Re-Evaluation  08/02/18    Authorization Type  [place] (15 visits approved)     Authorization Time Period  06/21/18 to 08/02/18    Authorization - Visit Number  1    Authorization - Number of Visits  15    PT Start Time  1055    PT Stop Time  1140    PT Time Calculation (min)  45 min    Activity Tolerance  Patient tolerated treatment well    Behavior During Therapy  [facility] for tasks assessed/performed       Past Medical History:  Diagnosis Date  . Arthralgia of knee, right   . Arthritis    left knee osteoarthritis   . Breast cancer ([place]) 03/26/14   Left  . HTN (hypertension)   . Hypothyroidism   . Pre-diabetes     Past Surgical History:  Procedure Laterality Date  . BREAST IMPLANT EXCHANGE Bilateral 05/03/2018  . MASTECTOMY Bilateral 2015   with reconstruction.   Marland Kitchen TOTAL KNEE ARTHROPLASTY Right 05/29/2018  . TOTAL KNEE ARTHROPLASTY Right 05/29/2018   Procedure: RIGHT  TOTAL KNEE ARTHROPLASTY;  Surgeon: [name], MD;  Location: [facility];  Service: Orthopedics;  Laterality: Right;    There were no vitals filed for this visit.   Subjective Assessment - 06/21/18 1055    Subjective  I had my right knee replaced on June 18th 2019 by Dr. Ninfa Linden. I did not have any HHPT, I just use a CPM, I got some exercises to work on in the hospital. I don't really have a hard time with too many things, stiars are going OK. No falls or close calls.     How long can you sit comfortably?  30-60 minutes but has to shift     How long can you stand comfortably?  15  minutes     How long can you walk comfortably?  couple hundred feet     Patient Stated Goals  get knee moving like it should be     Currently in Pain?  Yes    Pain Score  6     Pain Location  Knee    Pain Orientation  Right    Pain Descriptors / Indicators  Aching;Dull    Pain Type  Surgical pain    Pain Radiating Towards  running up to R hip     Pain Onset  1 to 4 weeks ago    Pain Frequency  Constant    Aggravating Factors   standing up too long     Pain Relieving Factors  heat, advil     Effect of Pain on Daily Activities  moderate          [facility] PT Assessment - 06/21/18 0001      Assessment   Medical Diagnosis  s/p R TKR     Referring Provider  Jean Rosenthal     Onset Date/Surgical Date  05/29/18    Next MD Visit  Dr. Ninfa Linden on July 31st, may change this date     Prior  Therapy  PT in the hospital, no HHPT       Precautions   Precautions  None      Restrictions   Weight Bearing Restrictions  No      Balance Screen   Has the patient fallen in the past 6 months  No    Has the patient had a decrease in activity level because of a fear of falling?   No    Is the patient reluctant to leave their home because of a fear of falling?   No      Prior Function   Level of Independence  Independent;Independent with basic ADLs;Independent with transfers;Independent with gait    Vocation  Full time employment    Vocation Requirements  [place] transportation     Leisure  walking, getting out of the house       Observation/Other Assessments   Observations  mild dehiscense noted mid incision, no drainage or inflammation noted, will contiue to monitor       ROM / Strength   AROM / PROM / Strength  AROM;Strength      AROM   AROM Assessment Site  Knee    Right/Left Knee  Right;Left    Right Knee Extension  18    Right Knee Flexion  80      Strength   Strength Assessment Site  Hip;Knee;Ankle    Right/Left Hip  Right;Left    Right Hip Flexion  4-/5     Right Hip Extension  2+/5    Right Hip ABduction  3+/5    Left Hip Flexion  4-/5    Left Hip Extension  2+/5    Left Hip ABduction  4+/5    Right/Left Knee  Left;Right    Right Knee Flexion  3-/5    Right Knee Extension  4+/5    Left Knee Flexion  4+/5    Left Knee Extension  4+/5    Right/Left Ankle  Left;Right      Ambulation/Gait   Gait Comments  reduced knee flexion, reduced step length/stance, reduced heel toe pattern; step to pattern on stairs, min cues for correct sequence                 Objective measurements completed on examination: See above findings.      [facility] Adult PT Treatment/Exercise - 06/21/18 0001      Exercises   Exercises  Lumbar;Knee/Hip      Knee/Hip Exercises: Stretches   Knee: Self-Stretch to increase Flexion  Right;3 reps;10 seconds    Other Knee/Hip Stretches  extension stretch x3 minutes 2.5# on chair       Manual Therapy   Manual Therapy  Soft tissue mobilization;Joint mobilization;Other (comment)    Manual therapy comments  separate from all other skilled services     Joint Mobilization  patella mobility all directions     Soft tissue mobilization  scar massage only over healed areas     Other Manual Therapy  knee extension PROM              PT Education - 06/21/18 1151    Education Details  prognosis, exam findings, POC, HEP, continue with hospital HEP, use of xeroform/neosporin and importance of monitoring dehisced area for proper healing     Person(s) Educated  Patient    Methods  Demonstration;Explanation;Handout    Comprehension  Verbalized understanding;Returned demonstration       PT Short Term Goals - 06/21/18 1154  PT SHORT TERM GOAL #1   Title  Patient to be compliant with appropriate HEP, to be updated PRN     Time  3    Period  Weeks    Status  New    Target Date  07/12/18      PT SHORT TERM GOAL #2   Title  Patient to demonstrate R knee ROM as being 0-115 degrees in order to improve mechanics and  reduce pain     Time  3    Period  Weeks    Status  New      PT SHORT TERM GOAL #3   Title  Patient to demonstrate completely  healed incision and will verbalize signs/symptoms of infection to be cognizant of, she will also be compliant with appropriate use of regular ice in order to improve self efficacy in managing condition     Time  3    Period  Weeks    Status  New      PT SHORT TERM GOAL #4   Title  Patient to report R knee pain as being no more than 2/10 in order to improve QOL and activity tolerance     Time  3    Period  Weeks    Status  New        PT Long Term Goals - 06/21/18 1208      PT LONG TERM GOAL #1   Title  Patient to demonstrate functional muscle strength as being MMT 5/5 in order to improve mechanics and reduce pain     Time  6    Period  Weeks    Status  New    Target Date  08/02/18      PT LONG TERM GOAL #2   Title  Patient to be able to reciprocally ascend/descend full flight of stairs with U railing, reciprocal pattern, good eccenric control in order to improve home/community access     Time  6    Period  Weeks    Status  New      PT LONG TERM GOAL #3   Title  Patient to be able to ambulate without assistive device on stable and unsteady surfaces without loss of balance and at least 1.46m/s in order to improve moblity and community access     Time  6    Period  Weeks    Status  New             Plan - 06/21/18 1152    Clinical Impression Statement  Patient arrives after receiving R TKR on 05/29/18 by Dr. Ninfa Linden; she reports she has done well since surgery, did not receive HHPT but has been using a CPM regularly and has kept up with hospital HEP. Examination reveals significant limitation in R knee ROM, functional muscle weakness, localized edema, gait and stair navigation deviation, and reduced functional activity tolerance. Of note, her incision is mildly dehisced in the middle portion; recommended use of Neosporin and Xeroform to promote  healing and will continue to monitor. She will benefit from skilled PT services to address functional deficits, monitor healing status, and assist in return to optimal level of function moving forward.     History and Personal Factors relevant to plan of care:  hx breast CA with mastectomies, R TKR 05/29/18    Clinical Presentation  Stable    Clinical Decision Making  Low    Rehab Potential  Excellent    PT Frequency  2x /  week    PT Duration  6 weeks    PT Treatment/Interventions  ADLs/Self Care Home Management;Biofeedback;Cryotherapy;Electrical Stimulation;Iontophoresis 4mg /ml Dexamethasone;Moist Heat;Ultrasound;DME Instruction;Gait training;Stair training;Functional mobility training;Therapeutic activities;Therapeutic exercise;Balance training;Neuromuscular re-education;Patient/family education;Manual techniques;Scar mobilization;Passive range of motion;Dry needling;Taping    PT Next Visit Plan  review HEP and goals; focus on ROM and progress to strength as ROM improves. STM/manual techniques. Monitor healing of incision.     PT Home Exercise Plan  Eval: continue hospital HEP, knee flexion and extension stretches, xeroform and neosporin on dehisced area of incision     Consulted and Agree with Plan of Care  Patient       Patient will benefit from skilled therapeutic intervention in order to improve the following deficits and impairments:  Abnormal gait, Decreased skin integrity, Increased fascial restricitons, Improper body mechanics, Pain, Decreased coordination, Decreased mobility, Decreased scar mobility, Increased muscle spasms, Decreased activity tolerance, Decreased range of motion, Hypomobility, Decreased strength, Difficulty walking, Impaired flexibility  Visit Diagnosis: Acute pain of right knee - Plan: PT plan of care cert/re-cert  Stiffness of right knee, not elsewhere classified - Plan: PT plan of care cert/re-cert  Localized edema - Plan: PT plan of care cert/re-cert  Muscle  weakness (generalized) - Plan: PT plan of care cert/re-cert     Problem List Patient Active Problem List   Diagnosis Date Noted  . 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 Primary osteoarthritis of left knee 04/11/2018  . Unilateral primary osteoarthritis, right knee 03/07/2018  . Chronic pain of right knee 03/07/2018  . Chronic pain of left knee 03/07/2018  . Malignant neoplasm of central portion of left female breast ([place]) 04/16/2014    Deniece Ree PT, DPT, [place]  Supplemental Physical Therapist [place]   Pager [facility] [address] [place], [place], [address] Phone: [phone]   Fax:  [phone]  Name: Kayla Miles MRN: [id number] Date of Birth: 07-21-1951

## 2018-06-21 NOTE — Patient Instructions (Signed)
   KNEE EXTENSION STRETCH - PROPPED  While seated, prop your foot up on another chair and allow gravity to stretch your knee towards a more straightened position.   You may put an ankle weight on top of your knee to help get more extension.  Start with 2-3 minutes, and work your way up to 7-10 as tolerated.  Repeat 2-3 times per day.     KNEE FLEXION STRETCH - SELF ASSISTED  While seated in a chair, use your unaffected leg to bend your affected knee until a stretch is felt.  Hold for 5 seconds.   Repeat 10-15 times, 2-3 times per day.

## 2018-06-22 NOTE — Telephone Encounter (Signed)
[facility] [place] [phone], ext [phone], was transferred to [place].  I LMVM for her inquiring why this request has not been addressed yet.  I have called patient and LM apologizing to her for the HHPT not happening.  Patient has now started OP PT, so no HHPT is needed at this time.  I will advise [facility] to cancel order, and I will f/u with [facility] once she calls me back.

## 2018-06-25 ENCOUNTER — Ambulatory Visit: Payer: No Typology Code available for payment source | Admitting: Physical Therapy

## 2018-06-25 ENCOUNTER — Encounter: Payer: Self-pay | Admitting: Physical Therapy

## 2018-06-25 DIAGNOSIS — M25561 Pain in right knee: Secondary | ICD-10-CM

## 2018-06-25 DIAGNOSIS — M6281 Muscle weakness (generalized): Secondary | ICD-10-CM

## 2018-06-25 DIAGNOSIS — M25661 Stiffness of right knee, not elsewhere classified: Secondary | ICD-10-CM

## 2018-06-25 DIAGNOSIS — R600 Localized edema: Secondary | ICD-10-CM

## 2018-06-25 NOTE — Patient Instructions (Signed)
   QUAD SET  Tighten your top thigh muscle as you attempt to press the back of your knee downward towards the table.  Hold for 5 seconds, then relax.  Repeat 15 times, 3 times per day.    HEEL SLIDES - LONG SIT WITH TOWEL AND BELT  While in a sitting position, place a small hand towel under your heel. Next, loop a belt, towel or bed sheet around your foot and pull your knee into a bend position as your foot slides towards your buttock. Hold a gentle stretch for 5 seconds and then return back to original position.  Repeat 15 times, 3 times per day.     SEATED HAMSTRING STRETCH  While seated, rest your heel on the floor with your knee straight and gently lean forward until a stretch is felt behind your knee/thigh.  Hold for at least 30 seconds, then relax.  Repeat 3 times with your right leg, 3 times per day.        STRAIGHT LEG RAISE - SLR  While lying on your back, raise up your leg with a straight knee.  Keep the opposite knee bent with the foot planted on the ground.  Make sure you are squeezing your quad, the big muscle in the front of your leg.   Repeat 10-15 times, 3 times per day.

## 2018-06-25 NOTE — Therapy (Signed)
[place], [place], [address] Phone: [phone]   Fax:  [phone]  Physical Therapy Treatment  Patient Details  Name: Kayla Miles MRN: [id number] Date of Birth: 18-Sep-1951 Referring Provider: Jean Rosenthal    Encounter Date: 06/25/2018  PT End of Session - 06/25/18 0931    Visit Number  2    Number of Visits  13    Date for PT Re-Evaluation  08/02/18    Authorization Type  VA (15 visits approved)     Authorization Time Period  06/21/18 to 08/02/18    Authorization - Visit Number  2    Authorization - Number of Visits  15    PT Start Time  0857    PT Stop Time  0932    PT Time Calculation (min)  35 min    Activity Tolerance  Patient tolerated treatment well    Behavior During Therapy  [facility] for tasks assessed/performed       Past Medical History:  Diagnosis Date  . Arthralgia of knee, right   . Arthritis    left knee osteoarthritis   . Breast cancer ([place]) 03/26/14   Left  . HTN (hypertension)   . Hypothyroidism   . Pre-diabetes     Past Surgical History:  Procedure Laterality Date  . BREAST IMPLANT EXCHANGE Bilateral 05/03/2018  . MASTECTOMY Bilateral 2015   with reconstruction.   Marland Kitchen TOTAL KNEE ARTHROPLASTY Right 05/29/2018  . TOTAL KNEE ARTHROPLASTY Right 05/29/2018   Procedure: RIGHT  TOTAL KNEE ARTHROPLASTY;  Surgeon: [name], MD;  Location: [facility];  Service: Orthopedics;  Laterality: Right;    There were no vitals filed for this visit.  Subjective Assessment - 06/25/18 0858    Subjective  My knee is hurting today, I've been putting on neosporin like you said and it seems like its coming together. I've not been taking a lot of medicines.     Patient Stated Goals  get knee moving like it should be     Currently in Pain?  Yes    Pain Score  6     Pain Location  Knee    Pain Orientation  Right    Pain Descriptors / Indicators  Aching;Dull    Pain Type   Surgical pain    Pain Radiating Towards  running up to R hip especially at night          [facility] PT Assessment - 06/25/18 0001      AROM   Right Knee Extension  9 following manual interventions     Right Knee Flexion  97                   [facility] Adult PT Treatment/Exercise - 06/25/18 0001      Knee/Hip Exercises: Stretches   Active Hamstring Stretch  Right;2 reps;30 seconds      Knee/Hip Exercises: Supine   Quad Sets  Both;1 set;20 reps;Other (comment) 5 second holds     Heel Slides  Both;1 set;20 reps 5 second holds     Bridges  10 reps      Manual Therapy   Manual Therapy  Joint mobilization;Passive ROM    Manual therapy comments  separate from all other skilled services     Joint Mobilization  patella mobility all directions     Other Manual Therapy  knee extension and flexion PROM 1x10 each to available ROM  PT Education - 06/25/18 0931    Education Details  HEP updates, general recovery during PT, POC moving forward, exercise form, continue using neosporin on incision for now     Person(s) Educated  Patient    Methods  Explanation    Comprehension  Verbalized understanding       PT Short Term Goals - 06/21/18 1154      PT SHORT TERM GOAL #1   Title  Patient to be compliant with appropriate HEP, to be updated PRN     Time  3    Period  Weeks    Status  New    Target Date  07/12/18      PT SHORT TERM GOAL #2   Title  Patient to demonstrate R knee ROM as being 0-115 degrees in order to improve mechanics and reduce pain     Time  3    Period  Weeks    Status  New      PT SHORT TERM GOAL #3   Title  Patient to demonstrate completely  healed incision and will verbalize signs/symptoms of infection to be cognizant of, she will also be compliant with appropriate use of regular ice in order to improve self efficacy in managing condition     Time  3    Period  Weeks    Status  New      PT SHORT TERM GOAL #4   Title  Patient to report  R knee pain as being no more than 2/10 in order to improve QOL and activity tolerance     Time  3    Period  Weeks    Status  New        PT Long Term Goals - 06/21/18 1208      PT LONG TERM GOAL #1   Title  Patient to demonstrate functional muscle strength as being MMT 5/5 in order to improve mechanics and reduce pain     Time  6    Period  Weeks    Status  New    Target Date  08/02/18      PT LONG TERM GOAL #2   Title  Patient to be able to reciprocally ascend/descend full flight of stairs with U railing, reciprocal pattern, good eccenric control in order to improve home/community access     Time  6    Period  Weeks    Status  New      PT LONG TERM GOAL #3   Title  Patient to be able to ambulate without assistive device on stable and unsteady surfaces without loss of balance and at least 1.51m/s in order to improve moblity and community access     Time  6    Period  Weeks    Status  New            Plan - 06/25/18 0932    Clinical Impression Statement  Patient arrives about 15 minutes late today. Her incision is healing better as she has been applying Neosporin/bandages per PT advice. Continued with manual and ROM based interventions, discussed importance of compliance with HEP in combination with regular PT sessions, also general course of recovery for total knee.     Rehab Potential  Excellent    PT Frequency  2x / week    PT Duration  6 weeks    PT Treatment/Interventions  ADLs/Self Care Home Management;Biofeedback;Cryotherapy;Electrical Stimulation;Iontophoresis 4mg /ml Dexamethasone;Moist Heat;Ultrasound;DME Instruction;Gait training;Stair training;Functional mobility training;Therapeutic activities;Therapeutic exercise;Balance training;Neuromuscular re-education;Patient/family  education;Manual techniques;Scar mobilization;Passive range of motion;Dry needling;Taping    PT Next Visit Plan  review HEP and goals; focus on ROM and progress to strength as ROM improves.  STM/manual techniques. Monitor healing of incision.     PT Home Exercise Plan  Eval: continue hospital HEP, knee flexion and extension stretches, xeroform and neosporin on dehisced area of incision, SLR, quad sets, HS curls, HS stretch     Consulted and Agree with Plan of Care  Patient       Patient will benefit from skilled therapeutic intervention in order to improve the following deficits and impairments:  Abnormal gait, Decreased skin integrity, Increased fascial restricitons, Improper body mechanics, Pain, Decreased coordination, Decreased mobility, Decreased scar mobility, Increased muscle spasms, Decreased activity tolerance, Decreased range of motion, Hypomobility, Decreased strength, Difficulty walking, Impaired flexibility  Visit Diagnosis: Acute pain of right knee  Stiffness of right knee, not elsewhere classified  Localized edema  Muscle weakness (generalized)     Problem List Patient Active Problem List   Diagnosis Date Noted  . 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 Primary osteoarthritis of left knee 04/11/2018  . Unilateral primary osteoarthritis, right knee 03/07/2018  . Chronic pain of right knee 03/07/2018  . Chronic pain of left knee 03/07/2018  . Malignant neoplasm of central portion of left female breast ([place]) 04/16/2014    Deniece Ree PT, DPT, [place]  Supplemental Physical Therapist [place]   Pager [facility] [address] [place], [place], [address] Phone: [phone]   Fax:  [phone]  Name: Kayla Miles MRN: [id number] Date of Birth: [birth year]

## 2018-06-27 ENCOUNTER — Encounter: Payer: Self-pay | Admitting: Physical Therapy

## 2018-06-27 ENCOUNTER — Ambulatory Visit: Payer: No Typology Code available for payment source | Admitting: Physical Therapy

## 2018-06-27 DIAGNOSIS — M25561 Pain in right knee: Secondary | ICD-10-CM

## 2018-06-27 DIAGNOSIS — M25661 Stiffness of right knee, not elsewhere classified: Secondary | ICD-10-CM

## 2018-06-27 DIAGNOSIS — M6281 Muscle weakness (generalized): Secondary | ICD-10-CM

## 2018-06-27 DIAGNOSIS — R600 Localized edema: Secondary | ICD-10-CM

## 2018-06-27 NOTE — Therapy (Signed)
[place], [place], [address] Phone: [phone]   Fax:  [phone]  Physical Therapy Treatment  Patient Details  Name: Kayla Miles MRN: [id number] Date of Birth: 25-May-1951 Referring Provider: Jean Rosenthal    Encounter Date: 06/27/2018  PT End of Session - 06/27/18 1411    Visit Number  3    Number of Visits  13    Date for PT Re-Evaluation  08/02/18    Authorization Type  [place] (15 visits approved)     Authorization Time Period  06/21/18 to 08/02/18    Authorization - Visit Number  3    Authorization - Number of Visits  15    PT Start Time  1330    PT Stop Time  1410    PT Time Calculation (min)  40 min    Activity Tolerance  Patient tolerated treatment well    Behavior During Therapy  [facility] for tasks assessed/performed       Past Medical History:  Diagnosis Date  . Arthralgia of knee, right   . Arthritis    left knee osteoarthritis   . Breast cancer ([place]) 03/26/14   Left  . HTN (hypertension)   . Hypothyroidism   . Pre-diabetes     Past Surgical History:  Procedure Laterality Date  . BREAST IMPLANT EXCHANGE Bilateral 05/03/2018  . MASTECTOMY Bilateral 2015   with reconstruction.   Marland Kitchen TOTAL KNEE ARTHROPLASTY Right 05/29/2018  . TOTAL KNEE ARTHROPLASTY Right 05/29/2018   Procedure: RIGHT  TOTAL KNEE ARTHROPLASTY;  Surgeon: [name], MD;  Location: [facility];  Service: Orthopedics;  Laterality: Right;    There were no vitals filed for this visit.  Subjective Assessment - 06/27/18 1332    Subjective  I'm ready to be healed. I'm still sore and hurting. My knee is still stiff. My incision is coming along well.     Patient Stated Goals  get knee moving like it should be     Currently in Pain?  Yes    Pain Score  5     Pain Location  Knee    Pain Orientation  Right    Pain Descriptors / Indicators  Aching;Dull    Pain Type  Surgical pain         [facility] PT  Assessment - 06/27/18 0001      AROM   Right Knee Extension  11    Right Knee Flexion  106 following manual and PROM/overpressure                    [facility] Adult PT Treatment/Exercise - 06/27/18 0001      Knee/Hip Exercises: Stretches   Other Knee/Hip Stretches  10 reps x 5 second holds       Knee/Hip Exercises: Supine   [employer] reps;Other (comment) 5 second holds     Heel Slides  Right;1 set;20 reps;Other (comment) 5 second holds     Bridges  10 reps    Straight Leg Raises  Right;1 set;15 reps;Other (comment) with quad set     Other Supine Knee/Hip Exercises  knee extension stretch with bolster under ankle and 2.5# weight x3 minutes       Manual Therapy   Manual Therapy  Joint mobilization;Soft tissue mobilization    Manual therapy comments  separate from all other skilled services     Joint Mobilization  patella mobility all directions, tib on femur for flexion grade III  Soft tissue mobilization  scar massage all directions     Other Manual Therapy  knee extension and flexion PROM 1x10 each to available ROM              PT Education - 06/27/18 1411    Education Details  exercise form, POC moving forward, ROM today     Person(s) Educated  Patient    Methods  Explanation    Comprehension  Verbalized understanding       PT Short Term Goals - 06/21/18 1154      PT SHORT TERM GOAL #1   Title  Patient to be compliant with appropriate HEP, to be updated PRN     Time  3    Period  Weeks    Status  New    Target Date  07/12/18      PT SHORT TERM GOAL #2   Title  Patient to demonstrate R knee ROM as being 0-115 degrees in order to improve mechanics and reduce pain     Time  3    Period  Weeks    Status  New      PT SHORT TERM GOAL #3   Title  Patient to demonstrate completely  healed incision and will verbalize signs/symptoms of infection to be cognizant of, she will also be compliant with appropriate use of regular ice in order to improve  self efficacy in managing condition     Time  3    Period  Weeks    Status  New      PT SHORT TERM GOAL #4   Title  Patient to report R knee pain as being no more than 2/10 in order to improve QOL and activity tolerance     Time  3    Period  Weeks    Status  New        PT Long Term Goals - 06/21/18 1208      PT LONG TERM GOAL #1   Title  Patient to demonstrate functional muscle strength as being MMT 5/5 in order to improve mechanics and reduce pain     Time  6    Period  Weeks    Status  New    Target Date  08/02/18      PT LONG TERM GOAL #2   Title  Patient to be able to reciprocally ascend/descend full flight of stairs with U railing, reciprocal pattern, good eccenric control in order to improve home/community access     Time  6    Period  Weeks    Status  New      PT LONG TERM GOAL #3   Title  Patient to be able to ambulate without assistive device on stable and unsteady surfaces without loss of balance and at least 1.81m/s in order to improve moblity and community access     Time  6    Period  Weeks    Status  New            Plan - 06/27/18 1411    Clinical Impression Statement  Continued with manual interventions for ROM and also with functional activities and exercises for strength and mobility of LE. Patient is somewhat pain limited but able to tolerate all interventions provided today, flexion ROM is steadily improving. Her incision is doing well and appears completely healed at this point, encouraged patient to keep skin in area hydrated to prevent further dehiscence. Will continue to progress as appropriate moving  forward. Ice applied to R knee at EOS.     Rehab Potential  Excellent    PT Frequency  2x / week    PT Duration  6 weeks    PT Next Visit Plan  continue on ROM focus, STM/manual techniques    PT Home Exercise Plan  Eval: continue hospital HEP, knee flexion and extension stretches, xeroform and neosporin on dehisced area of incision, SLR, quad sets,  HS curls, HS stretch     Consulted and Agree with Plan of Care  Patient       Patient will benefit from skilled therapeutic intervention in order to improve the following deficits and impairments:  Abnormal gait, Decreased skin integrity, Increased fascial restricitons, Improper body mechanics, Pain, Decreased coordination, Decreased mobility, Decreased scar mobility, Increased muscle spasms, Decreased activity tolerance, Decreased range of motion, Hypomobility, Decreased strength, Difficulty walking, Impaired flexibility  Visit Diagnosis: Acute pain of right knee  Stiffness of right knee, not elsewhere classified  Localized edema  Muscle weakness (generalized)     Problem List Patient Active Problem List   Diagnosis Date Noted  . 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 Primary osteoarthritis of left knee 04/11/2018  . Unilateral primary osteoarthritis, right knee 03/07/2018  . Chronic pain of right knee 03/07/2018  . Chronic pain of left knee 03/07/2018  . Malignant neoplasm of central portion of left female breast ([place]) 04/16/2014    Deniece Ree PT, DPT, [place]  Supplemental Physical Therapist [place]   Pager [place] [facility] [address] [place], [place], [address] Phone: [phone]   Fax:  [phone]  Name: Kayla Miles MRN: [id number] Date of Birth: [birth year]

## 2018-07-03 ENCOUNTER — Ambulatory Visit: Payer: No Typology Code available for payment source

## 2018-07-03 ENCOUNTER — Encounter: Payer: Non-veteran care | Admitting: Physical Therapy

## 2018-07-04 ENCOUNTER — Ambulatory Visit: Payer: No Typology Code available for payment source | Admitting: Physical Therapy

## 2018-07-06 ENCOUNTER — Ambulatory Visit: Payer: No Typology Code available for payment source | Admitting: Physical Therapy

## 2018-07-06 ENCOUNTER — Encounter: Payer: Self-pay | Admitting: Physical Therapy

## 2018-07-06 DIAGNOSIS — M25661 Stiffness of right knee, not elsewhere classified: Secondary | ICD-10-CM

## 2018-07-06 DIAGNOSIS — R600 Localized edema: Secondary | ICD-10-CM

## 2018-07-06 DIAGNOSIS — M25561 Pain in right knee: Secondary | ICD-10-CM

## 2018-07-06 DIAGNOSIS — M6281 Muscle weakness (generalized): Secondary | ICD-10-CM

## 2018-07-06 NOTE — Therapy (Signed)
[place], [place], [address] Phone: [phone]   Fax:  [phone]  Physical Therapy Treatment  Patient Details  Name: Kayla Miles MRN: [id number] Date of Birth: 10-17-1951 Referring Provider: Jean Rosenthal    Encounter Date: 07/06/2018  PT End of Session - 07/06/18 1116    Visit Number  4    Number of Visits  13    Date for PT Re-Evaluation  08/02/18    Authorization Type  VA (15 visits approved)     Authorization Time Period  06/21/18 to 08/02/18    Authorization - Visit Number  4    Authorization - Number of Visits  15    PT Start Time  1033    PT Stop Time  1125    PT Time Calculation (min)  52 min    Activity Tolerance  Patient tolerated treatment well    Behavior During Therapy  [facility] for tasks assessed/performed       Past Medical History:  Diagnosis Date  . Arthralgia of knee, right   . Arthritis    left knee osteoarthritis   . Breast cancer ([place]) 03/26/14   Left  . HTN (hypertension)   . Hypothyroidism   . Pre-diabetes     Past Surgical History:  Procedure Laterality Date  . BREAST IMPLANT EXCHANGE Bilateral 05/03/2018  . MASTECTOMY Bilateral 2015   with reconstruction.   Marland Kitchen TOTAL KNEE ARTHROPLASTY Right 05/29/2018  . TOTAL KNEE ARTHROPLASTY Right 05/29/2018   Procedure: RIGHT  TOTAL KNEE ARTHROPLASTY;  Surgeon: [name], MD;  Location: [facility];  Service: Orthopedics;  Laterality: Right;    There were no vitals filed for this visit.  Subjective Assessment - 07/06/18 1037    Subjective  No pain coming in to therapy. Using cane today when coming to PT. Was at the hospital with daughter on Wed and did a lot of walking which caused knee to swell.     Patient Stated Goals  get knee moving like it should be     Currently in Pain?  No/denies         [facility] PT Assessment - 07/06/18 0001      Assessment   Next MD Visit  8/1                    [facility] Adult PT Treatment/Exercise - 07/06/18 0001      Knee/Hip Exercises: Stretches   Passive Hamstring Stretch  Both;30 seconds seated EOB    Gastroc Stretch  Both slant board & at counter      Knee/Hip Exercises: Aerobic   Stationary Bike  5 min no resistance      Knee/Hip Exercises: Standing   Heel Raises  20 reps    SLS  3x30s each at counter    Other Standing Knee Exercises  squat to tap chair x10      Knee/Hip Exercises: Seated   Other Seated Knee/Hip Exercises  with leg extended- quad set + hip flexion      Knee/Hip Exercises: Sidelying   Hip ABduction  Right;20 reps abd+extension      Knee/Hip Exercises: Prone   Hip Extension  15 reps glut set + hip ext      Modalities   Modalities  Cryotherapy      Cryotherapy   Number Minutes Cryotherapy  10 Minutes    Cryotherapy Location  Knee    Type of Cryotherapy  Ice pack  Manual Therapy   Soft tissue mobilization  roller ITB & VL    Other Manual Therapy  passive knee extension               PT Short Term Goals - 06/21/18 1154      PT SHORT TERM GOAL #1   Title  Patient to be compliant with appropriate HEP, to be updated PRN     Time  3    Period  Weeks    Status  New    Target Date  07/12/18      PT SHORT TERM GOAL #2   Title  Patient to demonstrate R knee ROM as being 0-115 degrees in order to improve mechanics and reduce pain     Time  3    Period  Weeks    Status  New      PT SHORT TERM GOAL #3   Title  Patient to demonstrate completely  healed incision and will verbalize signs/symptoms of infection to be cognizant of, she will also be compliant with appropriate use of regular ice in order to improve self efficacy in managing condition     Time  3    Period  Weeks    Status  New      PT SHORT TERM GOAL #4   Title  Patient to report R knee pain as being no more than 2/10 in order to improve QOL and activity tolerance     Time  3    Period  Weeks    Status  New         PT Long Term Goals - 06/21/18 1208      PT LONG TERM GOAL #1   Title  Patient to demonstrate functional muscle strength as being MMT 5/5 in order to improve mechanics and reduce pain     Time  6    Period  Weeks    Status  New    Target Date  08/02/18      PT LONG TERM GOAL #2   Title  Patient to be able to reciprocally ascend/descend full flight of stairs with U railing, reciprocal pattern, good eccenric control in order to improve home/community access     Time  6    Period  Weeks    Status  New      PT LONG TERM GOAL #3   Title  Patient to be able to ambulate without assistive device on stable and unsteady surfaces without loss of balance and at least 1.23m/s in order to improve moblity and community access     Time  6    Period  Weeks    Status  New            Plan - 07/06/18 1244    Clinical Impression Statement  Added standing exercises for CKC control and strengthening challenges. good tolerance and was advised that she may be sore. Tightness noted along ITB and VL creating discomfort at anterior hip.     PT Treatment/Interventions  ADLs/Self Care Home Management;Biofeedback;Cryotherapy;Electrical Stimulation;Iontophoresis 4mg /ml Dexamethasone;Moist Heat;Ultrasound;DME Instruction;Gait training;Stair training;Functional mobility training;Therapeutic activities;Therapeutic exercise;Balance training;Neuromuscular re-education;Patient/family education;Manual techniques;Scar mobilization;Passive range of motion;Dry needling;Taping    PT Next Visit Plan  cont to progress extension, how did roller feel?    PT Home Exercise Plan  Eval: continue hospital HEP, knee flexion and extension stretches, xeroform and neosporin on dehisced area of incision, SLR, quad sets, HS curls, HS stretch     Consulted and Agree  with Plan of Care  Patient       Patient will benefit from skilled therapeutic intervention in order to improve the following deficits and impairments:  Abnormal  gait, Decreased skin integrity, Increased fascial restricitons, Improper body mechanics, Pain, Decreased coordination, Decreased mobility, Decreased scar mobility, Increased muscle spasms, Decreased activity tolerance, Decreased range of motion, Hypomobility, Decreased strength, Difficulty walking, Impaired flexibility  Visit Diagnosis: Acute pain of right knee  Stiffness of right knee, not elsewhere classified  Localized edema  Muscle weakness (generalized)     Problem List Patient Active Problem List   Diagnosis Date Noted  . 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 Primary osteoarthritis of left knee 04/11/2018  . Unilateral primary osteoarthritis, right knee 03/07/2018  . Chronic pain of right knee 03/07/2018  . Chronic pain of left knee 03/07/2018  . Malignant neoplasm of central portion of left female breast ([place]) 04/16/2014    Dakotah Orrego C. Cato Liburd PT, DPT 07/06/18 12:48 PM   [facility] [address] [place], [place], [address] Phone: [phone]   Fax:  [phone]  Name: Aveleen Nevers Miles MRN: [id number] Date of Birth: Jul 17, 1951

## 2018-07-09 ENCOUNTER — Ambulatory Visit: Payer: No Typology Code available for payment source | Admitting: Physical Therapy

## 2018-07-09 ENCOUNTER — Encounter: Payer: Self-pay | Admitting: Physical Therapy

## 2018-07-09 DIAGNOSIS — M25561 Pain in right knee: Secondary | ICD-10-CM

## 2018-07-09 DIAGNOSIS — M25661 Stiffness of right knee, not elsewhere classified: Secondary | ICD-10-CM

## 2018-07-09 DIAGNOSIS — R600 Localized edema: Secondary | ICD-10-CM

## 2018-07-09 NOTE — Therapy (Signed)
[place], [place], [address] Phone: [phone]   Fax:  [phone]  Physical Therapy Treatment  Patient Details  Name: Kayla Miles MRN: [id number] Date of Birth: [birth year] Referring Provider: Jean Rosenthal    Encounter Date: 07/09/2018  PT End of Session - 07/09/18 1100    Visit Number  5    Number of Visits  13    Date for PT Re-Evaluation  08/02/18    Authorization Type  VA (15 visits approved)     Authorization Time Period  06/21/18 to 08/02/18    Authorization - Visit Number  5    Authorization - Number of Visits  15    PT Start Time  1100    PT Stop Time  4196    PT Time Calculation (min)  48 min       Past Medical History:  Diagnosis Date  . Arthralgia of knee, right   . Arthritis    left knee osteoarthritis   . Breast cancer ([place]) 03/26/14   Left  . HTN (hypertension)   . Hypothyroidism   . Pre-diabetes     Past Surgical History:  Procedure Laterality Date  . BREAST IMPLANT EXCHANGE Bilateral 05/03/2018  . MASTECTOMY Bilateral 2015   with reconstruction.   Marland Kitchen TOTAL KNEE ARTHROPLASTY Right 05/29/2018  . TOTAL KNEE ARTHROPLASTY Right 05/29/2018   Procedure: RIGHT  TOTAL KNEE ARTHROPLASTY;  Surgeon: [name], MD;  Location: [facility];  Service: Orthopedics;  Laterality: Right;    There were no vitals filed for this visit.  Subjective Assessment - 07/09/18 1101    Subjective  My legs feel tired, I have been packing all weekend. I did not need to use the roller this weekend.     Patient Stated Goals  get knee moving like it should be     Currently in Pain?  No/denies         [facility] PT Assessment - 07/09/18 0001      AROM   Right Knee Extension  8    Right Knee Flexion  106                   [facility] Adult PT Treatment/Exercise - 07/09/18 0001      Knee/Hip Exercises: Stretches   Knee: Self-Stretch to increase Flexion  Right;5 reps;10  seconds with strap      Knee/Hip Exercises: Aerobic   Nustep  5 min L4 5 min post session      Knee/Hip Exercises: Standing   Heel Raises  2 sets;10 reps edge of step, full range      Knee/Hip Exercises: Sidelying   Other Sidelying Knee/Hip Exercises  90/90 lift to knee extension      Manual Therapy   Manual Therapy  Taping    Joint Mobilization  tibial ant & post gr 4 at end ranges    Soft tissue mobilization  scar mobs    Kinesiotex  Edema      Kinesiotix   Edema  Rt knee             PT Education - 07/09/18 1311    Education Details  k-tape, changing body position for ROM, plan to progress    Person(s) Educated  Patient    Methods  Explanation;Verbal cues    Comprehension  Verbalized understanding;Need further instruction       PT Short Term Goals - 07/09/18 1308      PT SHORT  TERM GOAL #1   Title  Patient to be compliant with appropriate HEP, to be updated PRN     Baseline  complaint to this point and will continue to progress as appropriate    Status  Achieved      PT SHORT TERM GOAL #2   Title  Patient to demonstrate R knee ROM as being 0-115 degrees in order to improve mechanics and reduce pain     Baseline  8-106    Status  On-going      PT SHORT TERM GOAL #3   Title  Patient to demonstrate completely  healed incision and will verbalize signs/symptoms of infection to be cognizant of, she will also be compliant with appropriate use of regular ice in order to improve self efficacy in managing condition     Status  Achieved      PT SHORT TERM GOAL #4   Title  Patient to report R knee pain as being no more than 2/10 in order to improve QOL and activity tolerance     Baseline  Has been highter over the last couple of days in preparation for move    Status  On-going        PT Long Term Goals - 06/21/18 1208      PT LONG TERM GOAL #1   Title  Patient to demonstrate functional muscle strength as being MMT 5/5 in order to improve mechanics and reduce pain      Time  6    Period  Weeks    Status  New    Target Date  08/02/18      PT LONG TERM GOAL #2   Title  Patient to be able to reciprocally ascend/descend full flight of stairs with U railing, reciprocal pattern, good eccenric control in order to improve home/community access     Time  6    Period  Weeks    Status  New      PT LONG TERM GOAL #3   Title  Patient to be able to ambulate without assistive device on stable and unsteady surfaces without loss of balance and at least 1.74m/s in order to improve moblity and community access     Time  6    Period  Weeks    Status  New            Plan - 07/09/18 1150    Clinical Impression Statement  Pt main complaint is regarding LE fatigue rather than pain. asked her to begin stretching flexion in supine rather than on chair in order to progress further. Ktape utilized to moderate edema following mobilizations. Asked her to begin scar mobilization at home at end range flexion. Will continue to challenge ROM, strength and dynamic balance due to continued fear without cane.     PT Treatment/Interventions  ADLs/Self Care Home Management;Biofeedback;Cryotherapy;Electrical Stimulation;Iontophoresis 4mg /ml Dexamethasone;Moist Heat;Ultrasound;DME Instruction;Gait training;Stair training;Functional mobility training;Therapeutic activities;Therapeutic exercise;Balance training;Neuromuscular re-education;Patient/family education;Manual techniques;Scar mobilization;Passive range of motion;Dry needling;Taping    PT Next Visit Plan  ROM, dynamic balance, recheck LE strength    PT Home Exercise Plan  Eval: continue hospital HEP, knee flexion and extension stretches, xeroform and neosporin on dehisced area of incision, SLR, quad sets, HS curls, HS stretch; scar mobilizations    Consulted and Agree with Plan of Care  Patient       Patient will benefit from skilled therapeutic intervention in order to improve the following deficits and impairments:  Abnormal  gait, Decreased skin integrity,  Increased fascial restricitons, Improper body mechanics, Pain, Decreased coordination, Decreased mobility, Decreased scar mobility, Increased muscle spasms, Decreased activity tolerance, Decreased range of motion, Hypomobility, Decreased strength, Difficulty walking, Impaired flexibility  Visit Diagnosis: Acute pain of right knee  Stiffness of right knee, not elsewhere classified  Localized edema     Problem List Patient Active Problem List   Diagnosis Date Noted  . 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 Primary osteoarthritis of left knee 04/11/2018  . Unilateral primary osteoarthritis, right knee 03/07/2018  . Chronic pain of right knee 03/07/2018  . Chronic pain of left knee 03/07/2018  . Malignant neoplasm of central portion of left female breast ([place]) 04/16/2014    Shahiem Bedwell C. Anushka Hartinger PT, DPT 07/09/18 1:13 PM   [facility] [address] [place], [place], [address] Phone: [phone]   Fax:  [phone]  Name: Kayla Miles MRN: [id number] Date of Birth: [birth year]

## 2018-07-11 ENCOUNTER — Ambulatory Visit (INDEPENDENT_AMBULATORY_CARE_PROVIDER_SITE_OTHER): Payer: Non-veteran care | Admitting: Orthopaedic Surgery

## 2018-07-12 ENCOUNTER — Ambulatory Visit: Payer: Non-veteran care | Attending: Physician Assistant | Admitting: Physical Therapy

## 2018-07-12 ENCOUNTER — Encounter: Payer: Self-pay | Admitting: Physical Therapy

## 2018-07-12 ENCOUNTER — Encounter (INDEPENDENT_AMBULATORY_CARE_PROVIDER_SITE_OTHER): Payer: Self-pay | Admitting: Orthopaedic Surgery

## 2018-07-12 ENCOUNTER — Ambulatory Visit (INDEPENDENT_AMBULATORY_CARE_PROVIDER_SITE_OTHER): Payer: PRIVATE HEALTH INSURANCE | Admitting: Orthopaedic Surgery

## 2018-07-12 DIAGNOSIS — Z96651 Presence of right artificial knee joint: Secondary | ICD-10-CM

## 2018-07-12 DIAGNOSIS — M6281 Muscle weakness (generalized): Secondary | ICD-10-CM | POA: Diagnosis present

## 2018-07-12 DIAGNOSIS — R600 Localized edema: Secondary | ICD-10-CM | POA: Diagnosis present

## 2018-07-12 DIAGNOSIS — M25661 Stiffness of right knee, not elsewhere classified: Secondary | ICD-10-CM | POA: Insufficient documentation

## 2018-07-12 DIAGNOSIS — M25561 Pain in right knee: Secondary | ICD-10-CM | POA: Diagnosis not present

## 2018-07-12 NOTE — Progress Notes (Signed)
The patient is now 6 weeks status post a right total knee arthroplasty.  She is doing well overall with no complaints.  She is not taking any medications now.  She is getting better sleep at night.  She does have upcoming eye surgery for a retinal issue.  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  Her left knee is having some pain and she has known arthritis in the left knee.  She like to have steroid injection today.  Examination of her right operative knee she lacks full extension by about 3 degrees she flexes easily to 100 degrees.  The knee feels loosely stable.  Her left knee does show arthritic changes with painful joint line.  Her request I did place a steroid injection in her left knee which she tolerated well.  As far as her right knee goes she is doing continue strengthening exercises.  We will see her back in 3 months with a repeat AP and lateral of the right knee.  All question concerns were answered and addressed.

## 2018-07-12 NOTE — Therapy (Signed)
[place], [place], [address] Phone: [phone]   Fax:  [phone]  Physical Therapy Treatment  Patient Details  Name: Kayla Miles MRN: [id number] Date of Birth: 1950-12-28 Referring Provider: Jean Rosenthal    Encounter Date: 07/12/2018  PT End of Session - 07/12/18 1206    Visit Number  6    Number of Visits  13    Date for PT Re-Evaluation  08/02/18    Authorization Type  [place] (15 visits approved)     Authorization Time Period  06/21/18 to 08/02/18    Authorization - Visit Number  6    Authorization - Number of Visits  15    PT Start Time  1200 pt arrived late    PT Stop Time  1230    PT Time Calculation (min)  30 min    Activity Tolerance  Patient tolerated treatment well    Behavior During Therapy  [facility] for tasks assessed/performed       Past Medical History:  Diagnosis Date  . Arthralgia of knee, right   . Arthritis    left knee osteoarthritis   . Breast cancer ([place]) 03/26/14   Left  . HTN (hypertension)   . Hypothyroidism   . Pre-diabetes     Past Surgical History:  Procedure Laterality Date  . BREAST IMPLANT EXCHANGE Bilateral 05/03/2018  . MASTECTOMY Bilateral 2015   with reconstruction.   Marland Kitchen TOTAL KNEE ARTHROPLASTY Right 05/29/2018  . TOTAL KNEE ARTHROPLASTY Right 05/29/2018   Procedure: RIGHT  TOTAL KNEE ARTHROPLASTY;  Surgeon: [name], MD;  Location: [place];  Service: Orthopedics;  Laterality: Right;    There were no vitals filed for this visit.  Subjective Assessment - 07/12/18 1206    Subjective  I am hurting today, I moved yesterday.     Patient Stated Goals  get knee moving like it should be     Currently in Pain?  Yes    Pain Orientation  Right    Pain Descriptors / Indicators  Sore                       [facility] Adult PT Treatment/Exercise - 07/12/18 0001      Knee/Hip Exercises: Stretches   Passive Hamstring Stretch   Right;3 reps;30 seconds    Knee: Self-Stretch to increase Flexion  Right;5 reps;10 seconds      Knee/Hip Exercises: Aerobic   Nustep  7 min L4      Knee/Hip Exercises: Standing   Other Standing Knee Exercises  standing glut sets to knee ext    Other Standing Knee Exercises  heel-toe pattern      Manual Therapy   Joint Mobilization  patellar mobs             PT Education - 07/12/18 1230    Education Details  soreness with moving, hypersensitivyt & scar care, gait pattern    Person(s) Educated  Patient    Methods  Explanation    Comprehension  Verbalized understanding       PT Short Term Goals - 07/09/18 1308      PT SHORT TERM GOAL #1   Title  Patient to be compliant with appropriate HEP, to be updated PRN     Baseline  complaint to this point and will continue to progress as appropriate    Status  Achieved      PT SHORT TERM GOAL #2   Title  Patient to demonstrate R knee ROM as being 0-115 degrees in order to improve mechanics and reduce pain     Baseline  8-106    Status  On-going      PT SHORT TERM GOAL #3   Title  Patient to demonstrate completely  healed incision and will verbalize signs/symptoms of infection to be cognizant of, she will also be compliant with appropriate use of regular ice in order to improve self efficacy in managing condition     Status  Achieved      PT SHORT TERM GOAL #4   Title  Patient to report R knee pain as being no more than 2/10 in order to improve QOL and activity tolerance     Baseline  Has been highter over the last couple of days in preparation for move    Status  On-going        PT Long Term Goals - 06/21/18 1208      PT LONG TERM GOAL #1   Title  Patient to demonstrate functional muscle strength as being MMT 5/5 in order to improve mechanics and reduce pain     Time  6    Period  Weeks    Status  New    Target Date  08/02/18      PT LONG TERM GOAL #2   Title  Patient to be able to reciprocally ascend/descend full  flight of stairs with U railing, reciprocal pattern, good eccenric control in order to improve home/community access     Time  6    Period  Weeks    Status  New      PT LONG TERM GOAL #3   Title  Patient to be able to ambulate without assistive device on stable and unsteady surfaces without loss of balance and at least 1.84m/s in order to improve moblity and community access     Time  6    Period  Weeks    Status  New            Plan - 07/12/18 1255    Clinical Impression Statement  Pt demo increased swelling today after moving yesterday where she had to climb multiple steps. Reports last night was the first night she was able to sleep without pain. Sees MD today for f/u.     PT Treatment/Interventions  ADLs/Self Care Home Management;Biofeedback;Cryotherapy;Electrical Stimulation;Iontophoresis 4mg /ml Dexamethasone;Moist Heat;Ultrasound;DME Instruction;Gait training;Stair training;Functional mobility training;Therapeutic activities;Therapeutic exercise;Balance training;Neuromuscular re-education;Patient/family education;Manual techniques;Scar mobilization;Passive range of motion;Dry needling;Taping    PT Next Visit Plan  ROM, dynamic balance, recheck LE strength    PT Home Exercise Plan  Eval: continue hospital HEP, knee flexion and extension stretches, xeroform and neosporin on dehisced area of incision, SLR, quad sets, HS curls, HS stretch; scar mobilizations    Consulted and Agree with Plan of Care  Patient       Patient will benefit from skilled therapeutic intervention in order to improve the following deficits and impairments:  Abnormal gait, Decreased skin integrity, Increased fascial restricitons, Improper body mechanics, Pain, Decreased coordination, Decreased mobility, Decreased scar mobility, Increased muscle spasms, Decreased activity tolerance, Decreased range of motion, Hypomobility, Decreased strength, Difficulty walking, Impaired flexibility  Visit Diagnosis: Acute pain of  right knee  Stiffness of right knee, not elsewhere classified  Localized edema  Muscle weakness (generalized)     Problem List Patient Active Problem List   Diagnosis Date Noted  . 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 Primary  osteoarthritis of left knee 04/11/2018  . Unilateral primary osteoarthritis, right knee 03/07/2018  . Chronic pain of right knee 03/07/2018  . Chronic pain of left knee 03/07/2018  . Malignant neoplasm of central portion of left female breast ([place]) 04/16/2014   Adeleine Pask C. Carrissa Taitano PT, DPT 07/12/18 1:00 PM   [facility] [address] [place], [place], [address] Phone: [phone]   Fax:  [phone]  Name: Kayla Miles MRN: [id number] Date of Birth: 1950/12/20

## 2018-07-16 ENCOUNTER — Ambulatory Visit: Payer: Non-veteran care | Admitting: Physical Therapy

## 2018-07-18 ENCOUNTER — Encounter: Payer: Self-pay | Admitting: Physical Therapy

## 2018-07-18 ENCOUNTER — Ambulatory Visit: Payer: Non-veteran care | Admitting: Physical Therapy

## 2018-07-18 DIAGNOSIS — M25661 Stiffness of right knee, not elsewhere classified: Secondary | ICD-10-CM

## 2018-07-18 DIAGNOSIS — M25561 Pain in right knee: Secondary | ICD-10-CM

## 2018-07-18 DIAGNOSIS — R600 Localized edema: Secondary | ICD-10-CM

## 2018-07-18 DIAGNOSIS — M6281 Muscle weakness (generalized): Secondary | ICD-10-CM

## 2018-07-18 NOTE — Therapy (Signed)
[place], [place], [address] Phone: [phone]   Fax:  [phone]  Physical Therapy Treatment  Patient Details  Name: Kayla Miles MRN: [id number] Date of Birth: 1951/03/21 Referring Provider: Jean Rosenthal    Encounter Date: 07/18/2018  PT End of Session - 07/18/18 1059    Visit Number  7    Number of Visits  13    Date for PT Re-Evaluation  08/02/18    Authorization Type  [place] (15 visits approved)     Authorization Time Period  06/21/18 to 08/02/18    Authorization - Visit Number  7    Authorization - Number of Visits  15    PT Start Time  1100    PT Stop Time  1142    PT Time Calculation (min)  42 min    Activity Tolerance  Patient tolerated treatment well    Behavior During Therapy  [facility] for tasks assessed/performed       Past Medical History:  Diagnosis Date  . Arthralgia of knee, right   . Arthritis    left knee osteoarthritis   . Breast cancer ([place]) 03/26/14   Left  . HTN (hypertension)   . Hypothyroidism   . Pre-diabetes     Past Surgical History:  Procedure Laterality Date  . BREAST IMPLANT EXCHANGE Bilateral 05/03/2018  . MASTECTOMY Bilateral 2015   with reconstruction.   Marland Kitchen TOTAL KNEE ARTHROPLASTY Right 05/29/2018  . TOTAL KNEE ARTHROPLASTY Right 05/29/2018   Procedure: RIGHT  TOTAL KNEE ARTHROPLASTY;  Surgeon: [name], MD;  Location: [place];  Service: Orthopedics;  Laterality: Right;    There were no vitals filed for this visit.  Subjective Assessment - 07/18/18 1106    Subjective  Every now and then I get a sharp pain in my knee. I want to tone my legs.     Patient Stated Goals  get knee moving like it should be     Currently in Pain?  No/denies                       [facility] Adult PT Treatment/Exercise - 07/18/18 0001      Knee/Hip Exercises: Stretches   Passive Hamstring Stretch  Both;2 reps;30 seconds seated EOB    Quad  Stretch  Both;30 seconds;2 reps prone with strap    Gastroc Stretch  Both;30 seconds edge of step      Knee/Hip Exercises: Aerobic   Elliptical  1 min for review of form    Nustep  5 min L5      Knee/Hip Exercises: Machines for Strengthening   Cybex Leg Press  upright leg press      Knee/Hip Exercises: Standing   Heel Raises  Both;20 reps edge of step    Other Standing Knee Exercises  sit<>stand red band around knees      Knee/Hip Exercises: Sidelying   Clams  x30 each red tband      Knee/Hip Exercises: Prone   Hip Extension  Both;20 reps with knee bent               PT Short Term Goals - 07/09/18 1308      PT SHORT TERM GOAL #1   Title  Patient to be compliant with appropriate HEP, to be updated PRN     Baseline  complaint to this point and will continue to progress as appropriate    Status  Achieved  PT SHORT TERM GOAL #2   Title  Patient to demonstrate R knee ROM as being 0-115 degrees in order to improve mechanics and reduce pain     Baseline  8-106    Status  On-going      PT SHORT TERM GOAL #3   Title  Patient to demonstrate completely  healed incision and will verbalize signs/symptoms of infection to be cognizant of, she will also be compliant with appropriate use of regular ice in order to improve self efficacy in managing condition     Status  Achieved      PT SHORT TERM GOAL #4   Title  Patient to report R knee pain as being no more than 2/10 in order to improve QOL and activity tolerance     Baseline  Has been highter over the last couple of days in preparation for move    Status  On-going        PT Long Term Goals - 06/21/18 1208      PT LONG TERM GOAL #1   Title  Patient to demonstrate functional muscle strength as being MMT 5/5 in order to improve mechanics and reduce pain     Time  6    Period  Weeks    Status  New    Target Date  08/02/18      PT LONG TERM GOAL #2   Title  Patient to be able to reciprocally ascend/descend full flight  of stairs with U railing, reciprocal pattern, good eccenric control in order to improve home/community access     Time  6    Period  Weeks    Status  New      PT LONG TERM GOAL #3   Title  Patient to be able to ambulate without assistive device on stable and unsteady surfaces without loss of balance and at least 1.17m/s in order to improve moblity and community access     Time  6    Period  Weeks    Status  New            Plan - 07/18/18 1143    Clinical Impression Statement  Cont to demo improvements in strength, endurance and functional mobility. Pt says today is the last day she will use her cane. Will continue to challenge pt as she transitions to independent gym program.    PT Treatment/Interventions  ADLs/Self Care Home Management;Biofeedback;Cryotherapy;Electrical Stimulation;Iontophoresis 4mg /ml Dexamethasone;Moist Heat;Ultrasound;DME Instruction;Gait training;Stair training;Functional mobility training;Therapeutic activities;Therapeutic exercise;Balance training;Neuromuscular re-education;Patient/family education;Manual techniques;Scar mobilization;Passive range of motion;Dry needling;Taping    PT Next Visit Plan  begin with Eliptical, dynamic balance    PT Home Exercise Plan  Eval: continue hospital HEP, knee flexion and extension stretches, xeroform and neosporin on dehisced area of incision, SLR, quad sets, HS curls, HS stretch; scar mobilizations    Consulted and Agree with Plan of Care  Patient       Patient will benefit from skilled therapeutic intervention in order to improve the following deficits and impairments:  Abnormal gait, Decreased skin integrity, Increased fascial restricitons, Improper body mechanics, Pain, Decreased coordination, Decreased mobility, Decreased scar mobility, Increased muscle spasms, Decreased activity tolerance, Decreased range of motion, Hypomobility, Decreased strength, Difficulty walking, Impaired flexibility  Visit Diagnosis: Acute pain of  right knee  Stiffness of right knee, not elsewhere classified  Localized edema  Muscle weakness (generalized)     Problem List Patient Active Problem List   Diagnosis Date Noted  . Status post total right  knee replacement 07/12/2018  . Primary osteoarthritis of left knee 04/11/2018  . Unilateral primary osteoarthritis, right knee 03/07/2018  . Chronic pain of right knee 03/07/2018  . Chronic pain of left knee 03/07/2018  . Malignant neoplasm of central portion of left female breast ([place]) 04/16/2014    Murphy Duzan C. Brynja Marker PT, DPT 07/18/18 11:45 AM   [facility] [address] [place], [place], [address] Phone: [phone]   Fax:  [phone]  Name: Kayla Miles MRN: [id number] Date of Birth: 01/24/1951

## 2018-07-23 ENCOUNTER — Ambulatory Visit: Payer: Non-veteran care | Admitting: Physical Therapy

## 2018-07-25 ENCOUNTER — Ambulatory Visit: Payer: Non-veteran care | Admitting: Physical Therapy

## 2018-07-30 ENCOUNTER — Ambulatory Visit: Payer: Non-veteran care | Admitting: Physical Therapy

## 2018-07-30 ENCOUNTER — Encounter: Payer: Self-pay | Admitting: Physical Therapy

## 2018-07-30 DIAGNOSIS — M6281 Muscle weakness (generalized): Secondary | ICD-10-CM

## 2018-07-30 DIAGNOSIS — M25661 Stiffness of right knee, not elsewhere classified: Secondary | ICD-10-CM

## 2018-07-30 DIAGNOSIS — M25561 Pain in right knee: Secondary | ICD-10-CM

## 2018-07-30 NOTE — Therapy (Signed)
[place], [place], [address] Phone: [phone]   Fax:  [phone]  Physical Therapy Treatment  Patient Details  Name: Kayla Miles MRN: [id number] Date of Birth: 1951/05/19 Referring Provider: Jean Rosenthal    Encounter Date: 07/30/2018  PT End of Session - 07/30/18 1104    Visit Number  8    Number of Visits  13    Date for PT Re-Evaluation  08/02/18    Authorization Type  [place] (15 visits approved)     Authorization Time Period  06/21/18 to 08/02/18    Authorization - Visit Number  8    Authorization - Number of Visits  15    PT Start Time  8756    PT Stop Time  1141    PT Time Calculation (min)  38 min    Activity Tolerance  Patient tolerated treatment well    Behavior During Therapy  [facility] for tasks assessed/performed       Past Medical History:  Diagnosis Date  . Arthralgia of knee, right   . Arthritis    left knee osteoarthritis   . Breast cancer ([place]) 03/26/14   Left  . HTN (hypertension)   . Hypothyroidism   . Pre-diabetes     Past Surgical History:  Procedure Laterality Date  . BREAST IMPLANT EXCHANGE Bilateral 05/03/2018  . MASTECTOMY Bilateral 2015   with reconstruction.   Marland Kitchen TOTAL KNEE ARTHROPLASTY Right 05/29/2018  . TOTAL KNEE ARTHROPLASTY Right 05/29/2018   Procedure: RIGHT  TOTAL KNEE ARTHROPLASTY;  Surgeon: [name], MD;  Location: [place];  Service: Orthopedics;  Laterality: Right;    There were no vitals filed for this visit.  Subjective Assessment - 07/30/18 1105    Subjective  My Mom passed last week and I had eye surgery. Knee is doing well, just a little stiff. I have been doing a lot of stairs     Patient Stated Goals  get knee moving like it should be     Currently in Pain?  No/denies                       [facility] Adult PT Treatment/Exercise - 07/30/18 0001      Exercises   Exercises  Knee/Hip      Knee/Hip  Exercises: Stretches   Passive Hamstring Stretch  Both;2 reps;30 seconds    Knee: Self-Stretch to increase Flexion  Both;30 seconds    Knee: Self-Stretch Limitations  prone with strap    Gastroc Stretch  Both;30 seconds   slant board   Gastroc Stretch Limitations  also done at counter after heel raises      Knee/Hip Exercises: Aerobic   Stationary Bike  5 min L1 ramp 4      Knee/Hip Exercises: Standing   Heel Raises Limitations  2 feet raise- single foot lower    Knee Flexion Limitations  in SLS on theraband pad    Abduction Limitations  in SLS on theraband pad    Lateral Step Up Limitations  onto and off of theraband pad      Knee/Hip Exercises: Seated   Long Arc Quad  10 reps;Both   3s holds   Long [employer] Limitations  ball bw knees               PT Short Term Goals - 07/09/18 1308      PT SHORT TERM GOAL #1   Title  Patient  to be compliant with appropriate HEP, to be updated PRN     Baseline  complaint to this point and will continue to progress as appropriate    Status  Achieved      PT SHORT TERM GOAL #2   Title  Patient to demonstrate R knee ROM as being 0-115 degrees in order to improve mechanics and reduce pain     Baseline  8-106    Status  On-going      PT SHORT TERM GOAL #3   Title  Patient to demonstrate completely  healed incision and will verbalize signs/symptoms of infection to be cognizant of, she will also be compliant with appropriate use of regular ice in order to improve self efficacy in managing condition     Status  Achieved      PT SHORT TERM GOAL #4   Title  Patient to report R knee pain as being no more than 2/10 in order to improve QOL and activity tolerance     Baseline  Has been highter over the last couple of days in preparation for move    Status  On-going        PT Long Term Goals - 06/21/18 1208      PT LONG TERM GOAL #1   Title  Patient to demonstrate functional muscle strength as being MMT 5/5 in order to improve mechanics  and reduce pain     Time  6    Period  Weeks    Status  New    Target Date  08/02/18      PT LONG TERM GOAL #2   Title  Patient to be able to reciprocally ascend/descend full flight of stairs with U railing, reciprocal pattern, good eccenric control in order to improve home/community access     Time  6    Period  Weeks    Status  New      PT LONG TERM GOAL #3   Title  Patient to be able to ambulate without assistive device on stable and unsteady surfaces without loss of balance and at least 1.9m/s in order to improve moblity and community access     Time  6    Period  Weeks    Status  New            Plan - 07/30/18 1142    Clinical Impression Statement  Avoided weights in order to not increase pressure in eye. Increased dynamic balance challenges by combining with strength exercises. Pt reported "I feel good" after treatment. Next visit will be d/c.     PT Treatment/Interventions  ADLs/Self Care Home Management;Biofeedback;Cryotherapy;Electrical Stimulation;Iontophoresis 4mg /ml Dexamethasone;Moist Heat;Ultrasound;DME Instruction;Gait training;Stair training;Functional mobility training;Therapeutic activities;Therapeutic exercise;Balance training;Neuromuscular re-education;Patient/family education;Manual techniques;Scar mobilization;Passive range of motion;Dry needling;Taping    PT Next Visit Plan  d/c    PT Home Exercise Plan  Eval: continue hospital HEP, knee flexion and extension stretches, xeroform and neosporin on dehisced area of incision, SLR, quad sets, HS curls, HS stretch; scar mobilizations    Consulted and Agree with Plan of Care  Patient       Patient will benefit from skilled therapeutic intervention in order to improve the following deficits and impairments:  Abnormal gait, Decreased skin integrity, Increased fascial restricitons, Improper body mechanics, Pain, Decreased coordination, Decreased mobility, Decreased scar mobility, Increased muscle spasms, Decreased  activity tolerance, Decreased range of motion, Hypomobility, Decreased strength, Difficulty walking, Impaired flexibility  Visit Diagnosis: Acute pain of right knee  Stiffness of right  knee, not elsewhere classified  Muscle weakness (generalized)     Problem List Patient Active Problem List   Diagnosis Date Noted  . Status post total right knee replacement 07/12/2018  . Primary osteoarthritis of left knee 04/11/2018  . Unilateral primary osteoarthritis, right knee 03/07/2018  . Chronic pain of right knee 03/07/2018  . Chronic pain of left knee 03/07/2018  . Malignant neoplasm of central portion of left female breast ([place]) 04/16/2014    Nakul Avino C. Elmer Boutelle PT, DPT 07/30/18 11:44 AM   [facility] [address] [place], [place], [address] Phone: [phone]   Fax:  [phone]  Name: Kayla Miles MRN: [id number] Date of Birth: 09-16-1951

## 2018-08-01 ENCOUNTER — Ambulatory Visit: Payer: Non-veteran care | Admitting: Physical Therapy

## 2018-08-01 ENCOUNTER — Encounter: Payer: Self-pay | Admitting: Physical Therapy

## 2018-08-01 DIAGNOSIS — M25561 Pain in right knee: Secondary | ICD-10-CM

## 2018-08-01 DIAGNOSIS — M25661 Stiffness of right knee, not elsewhere classified: Secondary | ICD-10-CM

## 2018-08-01 DIAGNOSIS — M6281 Muscle weakness (generalized): Secondary | ICD-10-CM

## 2018-08-01 NOTE — Therapy (Signed)
[place], [place], [address] Phone: [phone]   Fax:  [phone]  Physical Therapy Treatment/Discharge Summary  Patient Details  Name: Kayla Miles MRN: [id number] Date of Birth: 25-Dec-1950 Referring Provider: Jean Rosenthal    Encounter Date: 08/01/2018  PT End of Session - 08/01/18 1105    Visit Number  9    Number of Visits  13    Date for PT Re-Evaluation  08/02/18    Authorization Type  [facility] (15 visits approved)     PT Start Time  1100    PT Stop Time  1127    PT Time Calculation (min)  27 min    Activity Tolerance  Patient tolerated treatment well    Behavior During Therapy  [facility] for tasks assessed/performed       Past Medical History:  Diagnosis Date  . Arthralgia of knee, right   . Arthritis    left knee osteoarthritis   . Breast cancer ([place]) 03/26/14   Left  . HTN (hypertension)   . Hypothyroidism   . Pre-diabetes     Past Surgical History:  Procedure Laterality Date  . BREAST IMPLANT EXCHANGE Bilateral 05/03/2018  . MASTECTOMY Bilateral 2015   with reconstruction.   Marland Kitchen TOTAL KNEE ARTHROPLASTY Right 05/29/2018  . TOTAL KNEE ARTHROPLASTY Right 05/29/2018   Procedure: RIGHT  TOTAL KNEE ARTHROPLASTY;  Surgeon: [name], MD;  Location: [facility];  Service: Orthopedics;  Laterality: Right;    There were no vitals filed for this visit.  Subjective Assessment - 08/01/18 1104    Subjective  I am tired and cannot do much because of my eye.    How long can you walk comfortably?  able to walk through [employer] comfortably    Patient Stated Goals  get knee moving like it should be     Currently in Pain?  No/denies         [facility] PT Assessment - 08/01/18 0001      Observation/Other Assessments   Focus on Therapeutic Outcomes (FOTO)   27% limited      AROM   Right Knee Extension  6    Right Knee Flexion  110      Strength   Right Hip Flexion  5/5    Right  Hip Extension  5/5    Right Hip ABduction  5/5    Left Hip Flexion  5/5    Left Hip Extension  5/5    Left Hip ABduction  5/5    Right Knee Flexion  5/5    Right Knee Extension  5/5    Left Knee Flexion  5/5    Left Knee Extension  5/5                   [facility] Adult PT Treatment/Exercise - 08/01/18 0001      Knee/Hip Exercises: Stretches   Passive Hamstring Stretch  Both;2 reps;30 seconds      Knee/Hip Exercises: Sidelying   Hip ABduction  Right;15 reps             PT Education - 08/01/18 1128    Education Details  goals discussion, gym use, importance of continued HEP    Person(s) Educated  Patient    Methods  Explanation    Comprehension  Verbalized understanding       PT Short Term Goals - 07/09/18 1308      PT SHORT TERM GOAL #1   Title  Patient to be compliant with appropriate HEP, to be updated PRN     Baseline  complaint to this point and will continue to progress as appropriate    Status  Achieved      PT SHORT TERM GOAL #2   Title  Patient to demonstrate R knee ROM as being 0-115 degrees in order to improve mechanics and reduce pain     Baseline  8-106    Status  On-going      PT SHORT TERM GOAL #3   Title  Patient to demonstrate completely  healed incision and will verbalize signs/symptoms of infection to be cognizant of, she will also be compliant with appropriate use of regular ice in order to improve self efficacy in managing condition     Status  Achieved      PT SHORT TERM GOAL #4   Title  Patient to report R knee pain as being no more than 2/10 in order to improve QOL and activity tolerance     Baseline  Has been highter over the last couple of days in preparation for move    Status  On-going        PT Long Term Goals - 08/01/18 1106      PT LONG TERM GOAL #1   Title  Patient to demonstrate functional muscle strength as being MMT 5/5 in order to improve mechanics and reduce pain     Baseline  gross 5/5 in LE    Status   Achieved      PT LONG TERM GOAL #2   Title  Patient to be able to reciprocally ascend/descend full flight of stairs with U railing, reciprocal pattern, good eccenric control in order to improve home/community access     Baseline  able to do stairs to apartment- 20 of them    Status  Achieved      PT LONG TERM GOAL #3   Title  Patient to be able to ambulate without assistive device on stable and unsteady surfaces without loss of balance and at least 1.75ms in order to improve moblity and community access     Baseline  able    Status  Achieved            Plan - 08/01/18 1127    Clinical Impression Statement  Pt has met her goals at this time and is prepared for d/c to independent gym program. encouraged pt to contact [facility] any further questions.     PT Treatment/Interventions  ADLs/Self Care Home Management;Biofeedback;Cryotherapy;Electrical Stimulation;Iontophoresis '4mg'$ /ml Dexamethasone;Moist Heat;Ultrasound;DME Instruction;Gait training;Stair training;Functional mobility training;Therapeutic activities;Therapeutic exercise;Balance training;Neuromuscular re-education;Patient/family education;Manual techniques;Scar mobilization;Passive range of motion;Dry needling;Taping    PT Home Exercise Plan  Eval: continue hospital HEP, knee flexion and extension stretches, xeroform and neosporin on dehisced area of incision, SLR, quad sets, HS curls, HS stretch; scar mobilizations    Consulted and Agree with Plan of Care  Patient       Patient will benefit from skilled therapeutic intervention in order to improve the following deficits and impairments:  Abnormal gait, Decreased skin integrity, Increased fascial restricitons, Improper body mechanics, Pain, Decreased coordination, Decreased mobility, Decreased scar mobility, Increased muscle spasms, Decreased activity tolerance, Decreased range of motion, Hypomobility, Decreased strength, Difficulty walking, Impaired flexibility  Visit  Diagnosis: Acute pain of right knee  Stiffness of right knee, not elsewhere classified  Muscle weakness (generalized)     Problem List Patient Active Problem List   Diagnosis Date Noted  . Status post  total right knee replacement 07/12/2018  . Primary osteoarthritis of left knee 04/11/2018  . Unilateral primary osteoarthritis, right knee 03/07/2018  . Chronic pain of right knee 03/07/2018  . Chronic pain of left knee 03/07/2018  . Malignant neoplasm of central portion of left female breast ([place]) 04/16/2014   PHYSICAL THERAPY DISCHARGE SUMMARY  Visits from Start of Care: 9  Current functional level related to goals / functional outcomes: See above   Remaining deficits: See above   Education / Equipment: Anatomy of condition, POC, HEP, exercise form/rationale  Plan: Patient agrees to discharge.  Patient goals were met. Patient is being discharged due to meeting the stated rehab goals.  ?????     Aurianna Earlywine C. Levonte Molina PT, DPT 08/01/18 11:30 AM   [facility] [address] [place], [place], [address] Phone: [phone]   Fax:  [phone]  Name: Kayla Miles MRN: [id number] Date of Birth: [birth year]

## 2018-08-06 ENCOUNTER — Ambulatory Visit: Payer: Non-veteran care | Admitting: Physical Therapy

## 2018-08-08 ENCOUNTER — Ambulatory Visit: Payer: Non-veteran care | Admitting: Physical Therapy

## 2018-08-08 IMAGING — DX DG KNEE 1-2V PORT*R*
2 series · 2 of 2 positions shown · non-contrast
Comparison: No recent studies in PACs

CLINICAL DATA: Status post right total knee joint prosthesis
placement.

EXAM:
PORTABLE RIGHT KNEE - 1-2 VIEW

[knee ap]
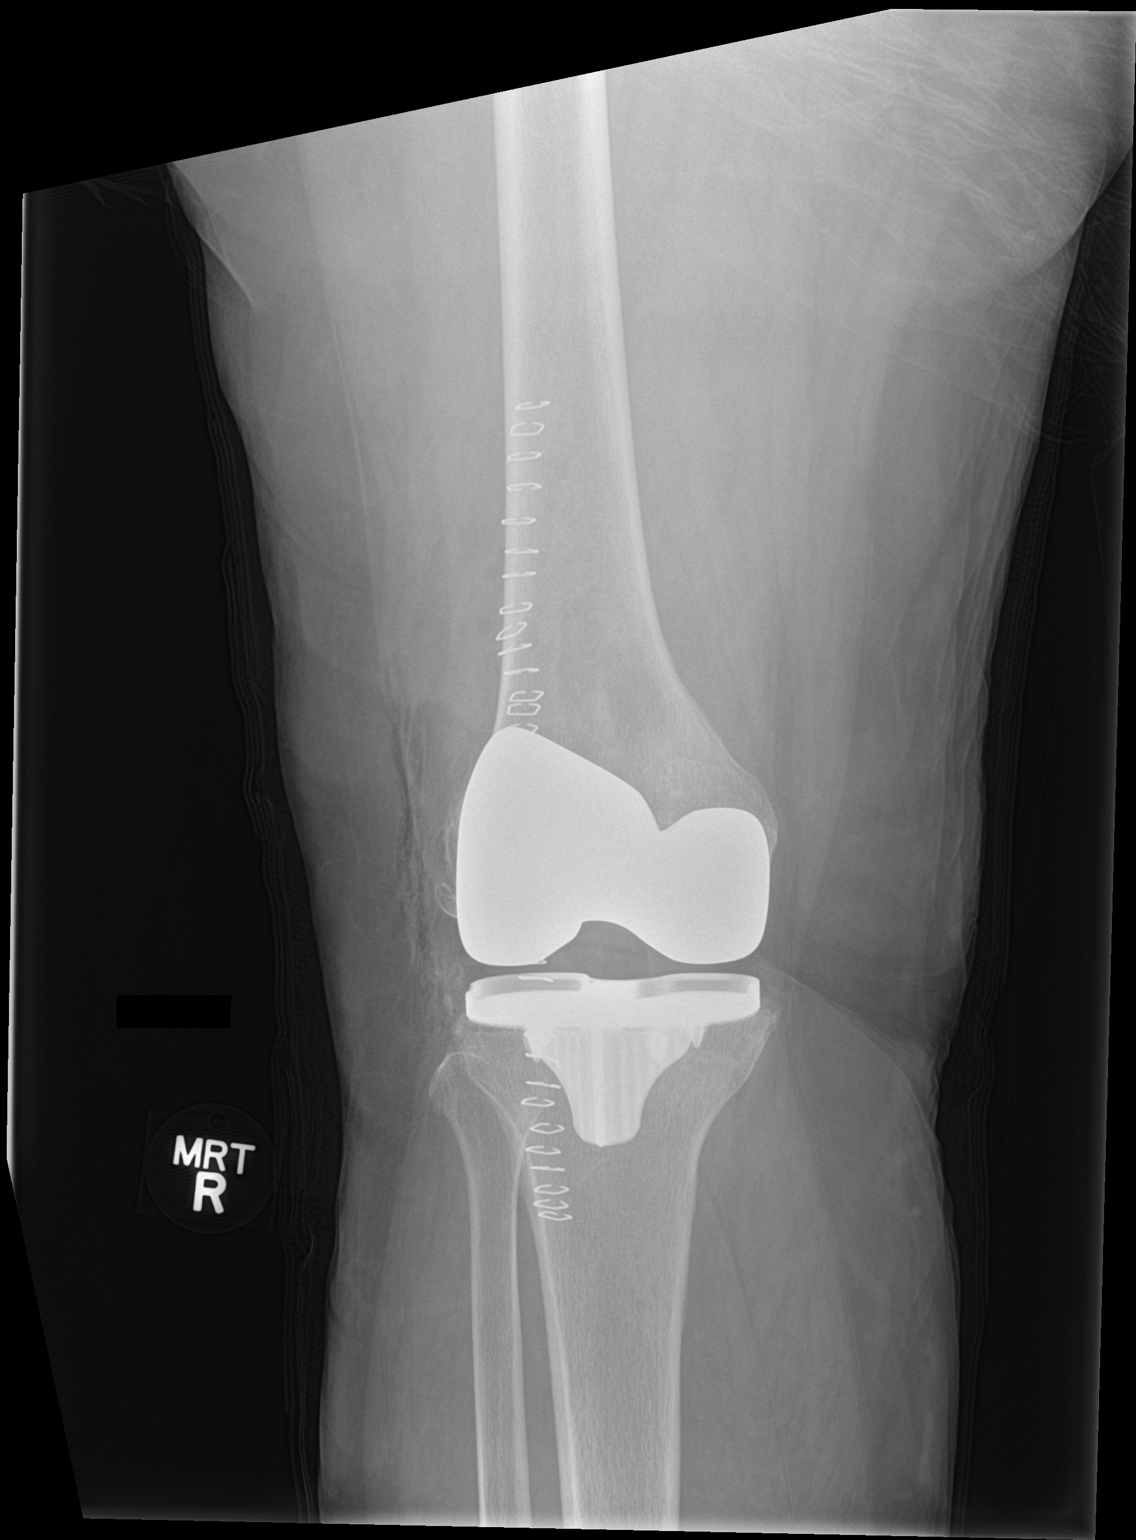

[knee lat]
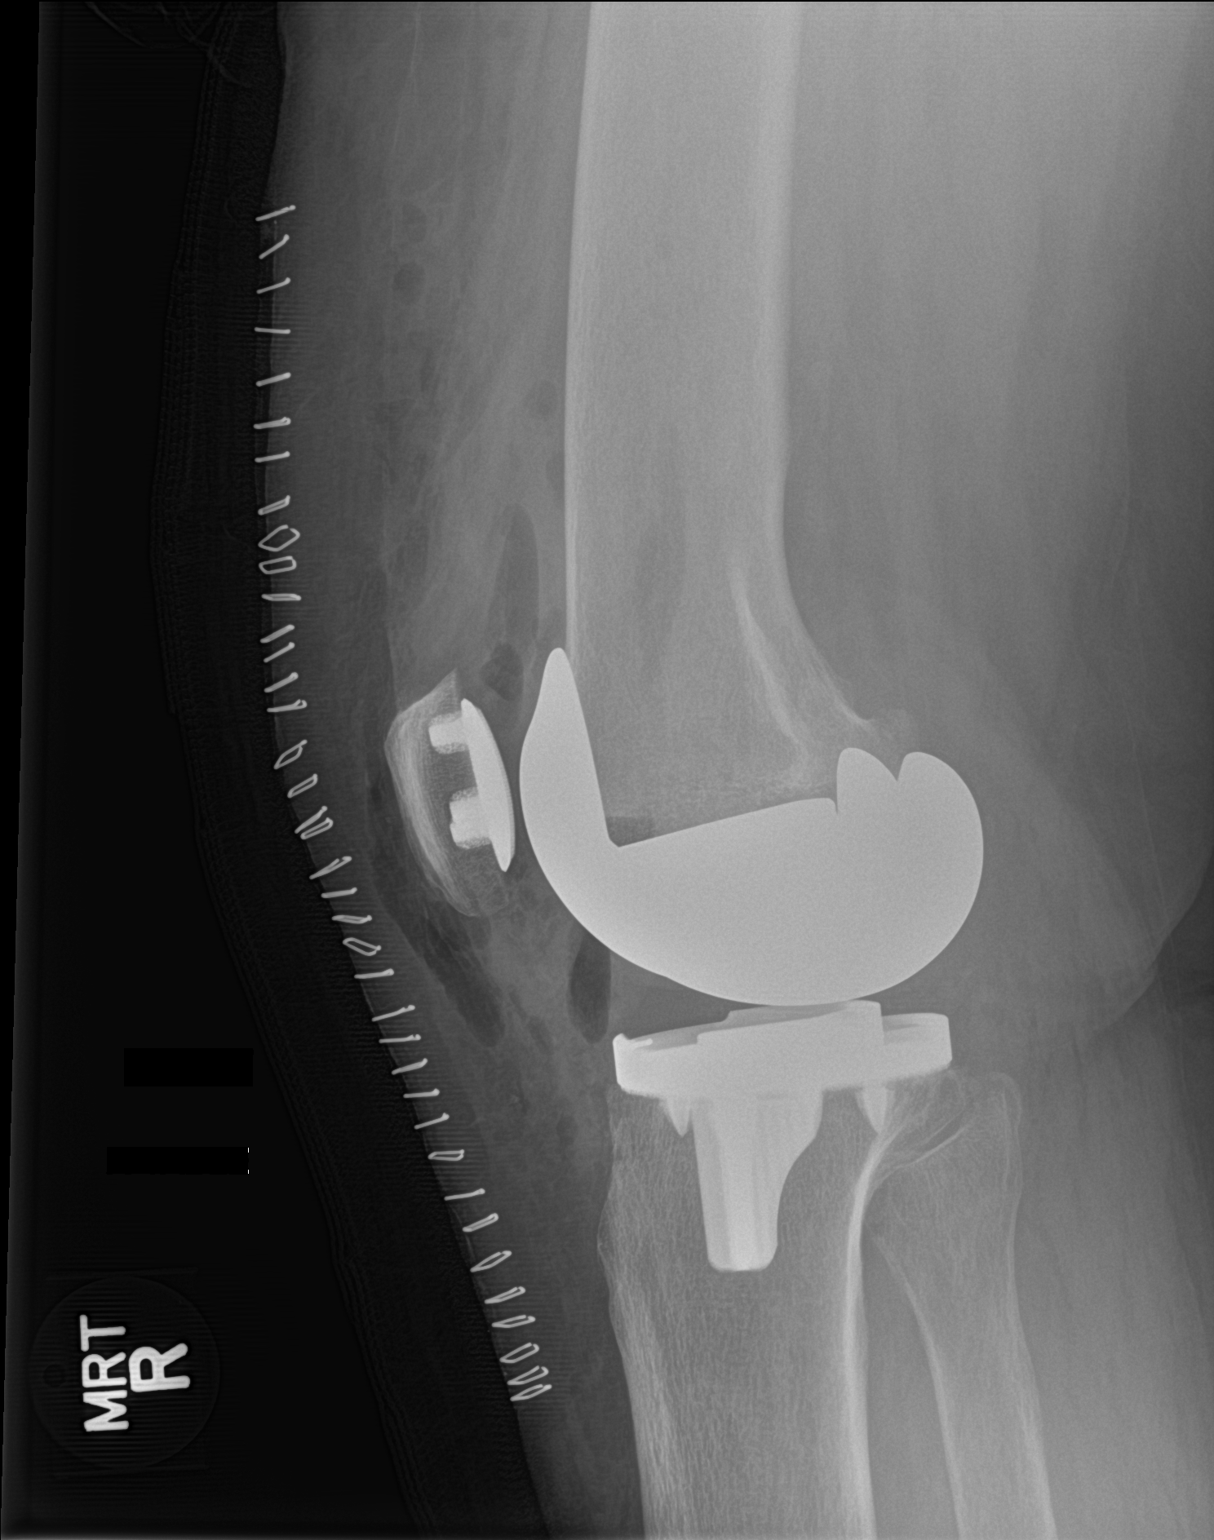

[2 of 2 positions shown; findings below may reference images not displayed]

FINDINGS: The patient has undergone right total knee joint prosthesis
placement. Radiographic positioning of the prosthetic components is
good. The interface with the native bone appears normal. There is a
small amount of fluid and air in the soft tissues of the anterior
aspect of the joint. There surgical skin staples present.
IMPRESSION: There is no immediate postprocedure complication following right
total knee joint prosthesis placement.

## 2018-08-14 ENCOUNTER — Ambulatory Visit: Payer: Non-veteran care | Admitting: Physical Therapy

## 2018-10-15 ENCOUNTER — Ambulatory Visit (INDEPENDENT_AMBULATORY_CARE_PROVIDER_SITE_OTHER): Payer: PRIVATE HEALTH INSURANCE | Admitting: Orthopaedic Surgery

## 2018-10-18 ENCOUNTER — Ambulatory Visit (INDEPENDENT_AMBULATORY_CARE_PROVIDER_SITE_OTHER): Payer: PRIVATE HEALTH INSURANCE | Admitting: Orthopaedic Surgery

## 2018-10-30 ENCOUNTER — Encounter (INDEPENDENT_AMBULATORY_CARE_PROVIDER_SITE_OTHER): Payer: Self-pay | Admitting: Orthopaedic Surgery

## 2018-10-30 ENCOUNTER — Ambulatory Visit (INDEPENDENT_AMBULATORY_CARE_PROVIDER_SITE_OTHER): Payer: PRIVATE HEALTH INSURANCE

## 2018-10-30 ENCOUNTER — Ambulatory Visit (INDEPENDENT_AMBULATORY_CARE_PROVIDER_SITE_OTHER): Payer: PRIVATE HEALTH INSURANCE | Admitting: Orthopaedic Surgery

## 2018-10-30 DIAGNOSIS — M1712 Unilateral primary osteoarthritis, left knee: Secondary | ICD-10-CM

## 2018-10-30 DIAGNOSIS — Z96651 Presence of right artificial knee joint: Secondary | ICD-10-CM

## 2018-10-30 DIAGNOSIS — M65331 Trigger finger, right middle finger: Secondary | ICD-10-CM | POA: Diagnosis not present

## 2018-10-30 MED ORDER — METHYLPREDNISOLONE ACETATE 40 MG/ML IJ SUSP
40.0000 mg | INTRAMUSCULAR | Status: AC | PRN
  Administered 2018-10-30: 40 mg via INTRA_ARTICULAR

## 2018-10-30 MED ORDER — DICLOFENAC SODIUM 1 % TD GEL: 4.0000 g | Freq: Four times a day (QID) | TRANSDERMAL | 2 refills | Status: DC

## 2018-10-30 MED ORDER — LIDOCAINE HCL 1 % IJ SOLN
0.5000 mL | INTRAMUSCULAR | Status: AC | PRN
  Administered 2018-10-30: .5 mL

## 2018-10-30 MED ORDER — METHYLPREDNISOLONE ACETATE 40 MG/ML IJ SUSP
20.0000 mg | INTRAMUSCULAR | Status: AC | PRN
  Administered 2018-10-30: 20 mg

## 2018-10-30 MED ORDER — LIDOCAINE HCL 1 % IJ SOLN
3.0000 mL | INTRAMUSCULAR | Status: AC | PRN
  Administered 2018-10-30: 3 mL

## 2018-10-30 NOTE — Progress Notes (Signed)
Office Visit Note   Patient: Kayla Miles           Date of Birth: 1951/04/14           MRN: [id number] Visit Date: 10/30/2018              Requested by: Henreitta Cea, MD No address on file PCP: Henreitta Cea, MD   Assessment & Plan: Visit Diagnoses:  1. History of total right knee replacement   2. Primary osteoarthritis of left knee   3. Trigger finger, right middle finger     Plan: Continue to work on range of motion strengthening the right knee.  See what type of response she has to the left knee injection she can have knee injections in the left knee as often as every 3 months.  She is given a prescription for Voltaren gel to apply 4 g 4 times daily to the left knee and 2 g 4 times daily to the right long finger trigger finger.  We will see him back in 6 months for her follow-up of her right total knee arthroplasty.  Questions encouraged and answered by Dr. Ninfa Linden and myself.  Repeat AP and lateral views of the right knee at that time.  Follow-Up Instructions: Return in about 6 months (around 04/30/2019).   Orders:  Orders Placed This Encounter  Procedures  . Large Joint Inj  . Hand/UE Inj  . XR Knee 1-2 Views Right   Meds ordered this encounter  Medications  . diclofenac sodium (VOLTAREN) 1 % GEL    Sig: Apply 4 g topically 4 (four) times daily. Apply to left knee  Also apply 2 grams to right trigger finger 4 times daily    Dispense:  1 Tube    Refill:  2      Procedures: Large Joint Inj: L knee on 10/30/2018 11:05 AM Indications: pain Details: 22 G 1.5 in needle, anterolateral approach  Arthrogram: No  Medications: 3 mL lidocaine 1 %; 40 mg methylPREDNISolone acetate 40 MG/ML Outcome: tolerated well, no immediate complications Procedure, treatment alternatives, risks and benefits explained, specific risks discussed. Consent was given by the patient. Immediately prior to procedure a time out was called to verify the correct patient, procedure,  equipment, support staff and site/side marked as required. Patient was prepped and draped in the usual sterile fashion.   Hand/UE Inj: R long A1 for trigger finger on 10/30/2018 11:05 AM Indications: pain and tendon swelling Details: 18 G needle, volar approach Medications: 0.5 mL lidocaine 1 %; 20 mg methylPREDNISolone acetate 40 MG/ML Consent was given by the patient. Immediately prior to procedure a time out was called to verify the correct patient, procedure, equipment, support staff and site/side marked as required. Patient was prepped and draped in the usual sterile fashion.       Clinical Data: No additional findings.   Subjective: Chief Complaint  Patient presents with  . Right Knee - Follow-up    HPI  Kayla Miles returns today for follow-up of her right total knee arthroplasty she is now 5 months postop.  She states she is doing well.  Still has what she feels like trouble bending the right knee.  She is having increased pain in the left knee also she is having right middle finger that is catching on her.  She said is that if she sleeps with the hand in a fist-like position that she wakes up in the fingers locked down.  She now goes to  sleep with the fingers outstretched and this seems to help.  She is nondiabetic.  She is requesting a repeat injection in the left knee.  Review of Systems See HPI otherwise negative  Objective: Vital Signs: There were no vitals taken for this visit.  Physical Exam  Constitutional: She is oriented to person, place, and time. She appears well-developed and well-nourished. No distress.  Pulmonary/Chest: Effort normal.  Neurological: She is alert and oriented to person, place, and time.  Skin: She is not diaphoretic.    Ortho Exam Left knee she has tenderness along medial joint line no effusion abnormal warmth erythema she has full extension full flexion.  Right knee well-healed surgical incision no signs of infection slight edema  compared to the left knee.  Right calf supple nontender.  No instability valgus varus stressing of either knee. Right hand long finger she has a palpable nodule at the A1 pulley and active triggering long finger.  Otherwise hand grossly neurovascular intact. Specialty Comments:  No specialty comments available.  Imaging: Xr Knee 1-2 Views Right  Result Date: 10/30/2018 Right knee AP and lateral views: Well-seated right total knee arthroplasty without any signs of complication.  No acute fracture.  Left knee with near bone-on-bone medial joint line and moderate narrowing of the lateral compartment.  Osteophytes seen along medial lateral borders of the knee.    PMFS History: Patient Active Problem List   Diagnosis Date Noted  . Status post total right knee replacement 07/12/2018  . Primary osteoarthritis of left knee 04/11/2018  . Unilateral primary osteoarthritis, right knee 03/07/2018  . Chronic pain of right knee 03/07/2018  . Chronic pain of left knee 03/07/2018  . Malignant neoplasm of central portion of left female breast ([place]) 04/16/2014   Past Medical History:  Diagnosis Date  . Arthralgia of knee, right   . Arthritis    left knee osteoarthritis   . Breast cancer ([place]) 03/26/14   Left  . HTN (hypertension)   . Hypothyroidism   . Pre-diabetes     Family History  Problem Relation Age of Onset  . Colon cancer Father 7  . Colon cancer Paternal Uncle     Past Surgical History:  Procedure Laterality Date  . BREAST IMPLANT EXCHANGE Bilateral 05/03/2018  . MASTECTOMY Bilateral 2015   with reconstruction.   Marland Kitchen TOTAL KNEE ARTHROPLASTY Right 05/29/2018  . TOTAL KNEE ARTHROPLASTY Right 05/29/2018   Procedure: RIGHT  TOTAL KNEE ARTHROPLASTY;  Surgeon: [name], MD;  Location: [facility];  Service: Orthopedics;  Laterality: Right;   Social History   Occupational History  . Not on file  Tobacco Use  . Smoking status: Never Smoker  . Smokeless tobacco: Never Used    Substance and Sexual Activity  . Alcohol use: No  . Drug use: Never  . Sexual activity: Not on file

## 2019-03-04 ENCOUNTER — Other Ambulatory Visit: Payer: Self-pay

## 2019-03-04 ENCOUNTER — Emergency Department (HOSPITAL_COMMUNITY)
Admission: EM | Admit: 2019-03-04 | Discharge: 2019-03-04 | Disposition: A | Discharge status: Home / Self Care | Payer: No Typology Code available for payment source | Attending: Emergency Medicine | Admitting: Emergency Medicine

## 2019-03-04 ENCOUNTER — Encounter (HOSPITAL_COMMUNITY): Payer: Self-pay

## 2019-03-04 ENCOUNTER — Emergency Department (HOSPITAL_COMMUNITY): Payer: No Typology Code available for payment source

## 2019-03-04 DIAGNOSIS — Z79899 Other long term (current) drug therapy: Secondary | ICD-10-CM | POA: Insufficient documentation

## 2019-03-04 DIAGNOSIS — Z7982 Long term (current) use of aspirin: Secondary | ICD-10-CM | POA: Diagnosis not present

## 2019-03-04 DIAGNOSIS — Z96651 Presence of right artificial knee joint: Secondary | ICD-10-CM | POA: Insufficient documentation

## 2019-03-04 DIAGNOSIS — M546 Pain in thoracic spine: Secondary | ICD-10-CM | POA: Insufficient documentation

## 2019-03-04 DIAGNOSIS — I10 Essential (primary) hypertension: Secondary | ICD-10-CM | POA: Diagnosis not present

## 2019-03-04 DIAGNOSIS — E039 Hypothyroidism, unspecified: Secondary | ICD-10-CM | POA: Diagnosis not present

## 2019-03-04 DIAGNOSIS — R110 Nausea: Secondary | ICD-10-CM

## 2019-03-04 DIAGNOSIS — M549 Dorsalgia, unspecified: Secondary | ICD-10-CM | POA: Diagnosis present

## 2019-03-04 LAB — CBC
HCT: 41.2 % (ref 36.0–46.0)
Hemoglobin: 12.9 g/dL (ref 12.0–15.0)
MCH: 22.4 pg — ABNORMAL LOW (ref 26.0–34.0)
MCHC: 31.3 g/dL (ref 30.0–36.0)
MCV: 71.4 fL — ABNORMAL LOW (ref 80.0–100.0)
NRBC: 0 % (ref 0.0–0.2)
Platelets: 301 10*3/uL (ref 150–400)
RBC: 5.77 MIL/uL — ABNORMAL HIGH (ref 3.87–5.11)
RDW: 15 % (ref 11.5–15.5)
WBC: 7.2 10*3/uL (ref 4.0–10.5)

## 2019-03-04 LAB — BASIC METABOLIC PANEL
Anion gap: 10 (ref 5–15)
BUN: 17 mg/dL (ref 8–23)
CO2: 24 mmol/L (ref 22–32)
Calcium: 9.2 mg/dL (ref 8.9–10.3)
Chloride: 104 mmol/L (ref 98–111)
Creatinine, Ser: 0.99 mg/dL (ref 0.44–1.00)
GFR calc Af Amer: >60 mL/min (ref >60)
GFR calc non Af Amer: 59 mL/min — ABNORMAL LOW (ref >60)
Glucose, Bld: 93 mg/dL (ref 70–99)
Potassium: 3.7 mmol/L (ref 3.5–5.1)
Sodium: 138 mmol/L (ref 135–145)

## 2019-03-04 LAB — I-STAT TROPONIN, ED
Troponin i, poc: 0.01 ng/mL (ref 0.00–0.08)
Troponin i, poc: 0 ng/mL (ref 0.00–0.08)

## 2019-03-04 LAB — URINALYSIS, ROUTINE W REFLEX MICROSCOPIC
Bilirubin Urine: NEGATIVE
Glucose, UA: NEGATIVE mg/dL
Hgb urine dipstick: NEGATIVE
Ketones, ur: NEGATIVE mg/dL
Leukocytes,Ua: NEGATIVE
Nitrite: NEGATIVE
Protein, ur: NEGATIVE mg/dL
Specific Gravity, Urine: 1.008 (ref 1.005–1.030)
pH: 6 (ref 5.0–8.0)

## 2019-03-04 MED ORDER — ASPIRIN 81 MG PO CHEW
324.0000 mg | CHEWABLE_TABLET | Freq: Once | ORAL | Status: DC
  Filled 2019-03-04: qty 4

## 2019-03-04 NOTE — ED Notes (Signed)
Pt able to ambulate independently to bathroom without difficulty.

## 2019-03-04 NOTE — ED Triage Notes (Signed)
Lower back pain starting last night. Took oxycodone but did not relieve pain.  But does say eventually went away on its own. This morning around 2am came back with nausea. Concerned it may be cardiac related as her mother had heart issues and would only have nausea with her issues. Afebrile.

## 2019-03-04 NOTE — Discharge Instructions (Signed)
SEEK IMMEDIATE MEDICAL ATTENTION IF: New numbness, tingling, weakness, or problem with the use of your arms or legs.  Severe back pain not relieved with medications.  Change in bowel or bladder control.  Increasing pain in any areas of the body (such as chest or abdominal pain).  Shortness of breath, dizziness or fainting.  Nausea (feeling sick to your stomach), vomiting, fever, or sweats.  Your caregiver has diagnosed you as having chest pain that is not specific for one problem, but does not require admission.  You are at low risk for an acute heart condition or other serious illness. Chest pain comes from many different causes.  SEEK IMMEDIATE MEDICAL ATTENTION IF: You have severe chest pain, especially if the pain is crushing or pressure-like and spreads to the arms, back, neck, or jaw, or if you have sweating, nausea (feeling sick to your stomach), or shortness of breath. THIS IS AN EMERGENCY. Don't wait to see if the pain will go away. Get medical help at once. Call 911 or 0 (operator). DO NOT drive yourself to the hospital.  Your chest pain gets worse and does not go away with rest.  You have an attack of chest pain lasting longer than usual, despite rest and treatment with the medications your caregiver has prescribed.  You wake from sleep with chest pain or shortness of breath.  You feel dizzy or faint.  You have chest pain not typical of your usual pain for which you originally saw your caregiver.

## 2019-03-04 NOTE — ED Provider Notes (Signed)
[facility] EMERGENCY DEPARTMENT Provider Note   CSN: [id number] Arrival date & time: 03/04/19  1116    History   Chief Complaint Chief Complaint  Patient presents with   Back Pain    HPI Kayla Miles is a 68 y.o. female.  Who presents the emergency department chief complaint of back pain.  Patient states 3 days ago she was helping her daughter do some light housework.  The next day she noticed that her back was hurting in the bilateral flank region.  She states that the pain was aching, severe, came on suddenly.  Nothing seems to make her pain worse including movement or twisting.  She thought maybe she had some muscular tenderness from the cleaning and took an oxycodone at bedtime.  She took another 1 in the evening.  It did not seem to improve her pain.  She has not taken any pain medications since that time because she states "it did not help and I did not want I do not myself up with those drugs."  She states that all day Sunday she was feeling well however this morning the same pain woke her from sleep.  On the way here it seemed to radiate into her shoulder blade region.  She had some mild nausea when she woke up.  Patient states that she became concerned that it could be cardiac symptoms.  She denies any neurological complaints such as unilateral weakness, paresthesia in the extremities.  She denies urinary symptoms, fevers, chills, vomiting, diaphoresis, chest pain or shortness of breath.  Her pain has improved since this morning without medications however it is still present.  She has a history of hypertension.  She denies a history of hyperlipidemia or smoking.       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    TOTAL KNEE ARTHROPLASTY Right 05/29/2018   TOTAL KNEE ARTHROPLASTY Right 05/29/2018   Procedure: RIGHT  TOTAL KNEE ARTHROPLASTY;  Surgeon: [name], MD;  Location: [place];  Service: Orthopedics;  Laterality: Right;     OB History   No obstetric history on file.    Obstetric Comments  Menarche age 88, Parity age 41 G 3,P3, BC x 7 years, Menopause in her 10's, NO  HRT         Home Medications    Prior to Admission medications   Medication Sig Start Date End Date Taking? Authorizing Provider  amLODipine (NORVASC) 5 MG tablet Take 5 mg by mouth every other day. In the morning.    [provider]  aspirin 325 MG EC tablet Take 1 tablet (325 mg total) by mouth daily with breakfast. Patient not taking: Reported on 06/21/2018 06/01/18  Pete Pelt, PA-C  calcium-vitamin D (OSCAL WITH D) 500-200 MG-UNIT TABS tablet Take by mouth.    [provider]  Cholecalciferol (VITAMIN D3 PO) Take 1 tablet by mouth daily.    [provider]  diclofenac sodium (VOLTAREN) 1 % GEL Apply 4 g topically 4 (four) times daily. Apply to left knee  Also apply 2 grams to right trigger finger 4 times daily 10/30/18   Pete Pelt, PA-C  ibuprofen (ADVIL,MOTRIN) 800 MG tablet Take by mouth.    [provider]  levothyroxine (SYNTHROID) 100 MCG tablet Synthroid    [provider]  levothyroxine (SYNTHROID, LEVOTHROID) 88 MCG tablet Take 88 mcg by mouth daily before  breakfast.    [provider]  methocarbamol (ROBAXIN) 500 MG tablet Take 1 tablet (500 mg total) by mouth every 6 (six) hours as needed for muscle spasms. Patient not taking: Reported on 06/21/2018 05/31/18   Pete Pelt, PA-C  ondansetron (ZOFRAN) 4 MG tablet Take 1 tablet (4 mg total) by mouth every 6 (six) hours as needed for nausea. Patient not taking: Reported on 06/21/2018 05/31/18   Pete Pelt, PA-C  oxyCODONE (OXY IR/ROXICODONE) 5 MG immediate release tablet Take 1-2 tablets (5-10 mg total) by mouth every 4 (four) hours as needed for moderate pain (pain score 4-6). Patient not taking: Reported on 06/21/2018 05/31/18   Pete Pelt, PA-C    Family History Family History  Problem Relation Age of Onset   Colon cancer Father 31   Colon cancer Paternal Uncle     Social History Social History   Tobacco Use   Smoking status: Never Smoker   Smokeless tobacco: Never Used  Substance Use Topics   Alcohol use: No   Drug use: Never     Allergies   Contrast media [iodinated diagnostic agents]; Prohance [gadoteridol]; and Codeine   Review of Systems Review of Systems  Ten systems reviewed and are negative for acute change, except as noted in the HPI.   Physical Exam Updated Vital Signs BP (!) 149/92    Pulse 70    Temp 97.9 F (36.6 C) (Oral)    Resp 20    Ht 5\' 5"  (1.651 m)    Wt 81.6 kg    SpO2 100%    BMI 29.95 kg/m   Physical Exam Physical Exam  Nursing note and vitals reviewed. Constitutional: She is oriented to person, place, and time. She appears well-developed and well-nourished. No distress.  HENT:  Head: Normocephalic and atraumatic.  Eyes: Conjunctivae normal and EOM are normal. Pupils are equal, round, and reactive to light. No scleral icterus.  Neck: Normal range of motion.  Cardiovascular: Normal rate, regular rhythm and normal heart sounds.  Exam reveals no gallop and no friction rub.   No murmur heard. Pulmonary/Chest: Effort  normal and breath sounds normal. No respiratory distress.  Abdominal: Soft. Bowel sounds are normal. She exhibits no distension and no mass. There is no tenderness. There is no guarding.  Musculoskeletal: No midline spinal tenderness, no CVA tenderness.  Full range of motion of the lumbar spine without pain or stiffness. Neurological: She is alert and oriented to person, place, and time. Normal DTRs and strength in the lower extremities. Skin: Skin is warm and dry. She is not diaphoretic.     ED Treatments / Results  Labs (all labs ordered are listed, but only abnormal results are displayed) Labs Reviewed  BASIC METABOLIC PANEL - Abnormal; Notable for the following components:  Result Value   GFR calc non Af Amer 59 (*)    All other components within normal limits  CBC - Abnormal; Notable for the following components:   RBC 5.77 (*)    MCV 71.4 (*)    MCH 22.4 (*)    All other components within normal limits  URINALYSIS, ROUTINE W REFLEX MICROSCOPIC - Abnormal; Notable for the following components:   Color, Urine STRAW (*)    All other components within normal limits  I-STAT TROPONIN, ED  I-STAT TROPONIN, ED    EKG EKG Interpretation  Date/Time:  Monday March 04 2019 11:35:18 EDT Ventricular Rate:  71 PR Interval:    QRS Duration: 80 QT Interval:  403 QTC Calculation: 438 R Axis:   29 Text Interpretation:  Sinus rhythm Consider left atrial enlargement Baseline wander in lead(s) V2 V5 Confirmed by Fredia Sorrow [phone]) on 03/04/2019 1:16:41 PM Also confirmed by Fredia Sorrow [phone]), editor Philomena Doheny [phone])  on 03/05/2019 6:56:15 AM   Radiology Dg Chest 2 View  Result Date: 03/04/2019 CLINICAL DATA:  Back pain and nausea since Sunday, began as lower back pain with pain moving up to between her shoulder blades, history LEFT breast cancer 2015, hypertension EXAM: CHEST - 2 VIEW COMPARISON:  11/10/2009 FINDINGS: Borderline enlargement of cardiac silhouette.  Mediastinal contours and pulmonary vascularity normal. Minimal linear atelectasis versus scarring in lingula. Lungs otherwise clear. No pulmonary infiltrate, pleural effusion, or pneumothorax. Breast prostheses present. No acute osseous findings. IMPRESSION: Minimal linear atelectasis versus scarring in lingula. Electronically Signed   By: Lavonia Dana M.D.   On: 03/04/2019 12:40    Procedures Procedures (including critical care time)  Medications Ordered in ED Medications - No data to display   Initial Impression / Assessment and Plan / ED Course  I have reviewed the triage vital signs and the nursing notes.  Pertinent labs & imaging results that were available during my care of the patient were reviewed by me and considered in my medical decision making (see chart for details).        ZG:YFVC pain nausea  And concern for ACS VS:  Vitals:   03/04/19 1145 03/04/19 1200 03/04/19 1215 03/04/19 1710  BP: (!) 152/93 (!) 150/88 (!) 149/92   Pulse: 76 68 71 70  Resp: 17 14 20 20   Temp:    97.9 F (36.6 C)  TempSrc:    Oral  SpO2: 98% 98% 97% 100%  Weight:      Height:       BS:WHQPRFF is gathered by the patient and review of EMR. DDX: The emergent differential diagnosis of chest pain includes: Acute coronary syndrome, pericarditis, aortic dissection, pulmonary embolism, tension pneumothorax, pneumonia, and esophageal rupture.  The emergent differential diagnosis for back pain includes but is not limited to fracture, muscle strain, cauda equina, spinal stenosis. DDD, ankylosing spondylitis, acute ligamentous injury, disk herniation, spondylolisthesis, Epidural compression syndrome, metastatic cancer, transverse myelitis, vertebral osteomyelitis, diskitis, kidney stone, pyelonephritis, AAA, Perforated ulcer, Retrocecal appendicitis, pancreatitis, bowel obstruction, retroperitoneal hemorrhage or mass, meningitis. Labs: I reviewed the labs which show  Normal UA, CBC without acute abnormality  2 negative troponins over 3 hours. Imaging: I personally reviewed the images (cxr) which show(s) no acute infections or widened mediastinum EKG:NSR- no signs of ischemic change MDM:. Patient with back pain.  No neurological deficits and normal neuro exam.  Patient can walk but states is painful.  No loss of bowel or bladder control.  No concern for cauda equina.  No fever, night sweats, weight loss, h/o cancer, IVDU.  RICE protocol and pain medicine indicated and discussed with patient. Patient is to be discharged with recommendation to follow up with PCP in regards to today's hospital visit. Back pain is not likely of cardiac or pulmonary etiology d/t presentation,  VSS, no tracheal deviation, no JVD or new murmur, RRR, breath sounds equal bilaterally, EKG without acute abnormalities, negative troponin, and negative CXR. Pt has been advised start a PPI and return to the ED is CP becomes exertional, associated with diaphoresis or nausea, radiates to left jaw/arm, worsens or becomes concerning in any way. Pt appears reliable for follow up and is agreeable to discharge.   Case has been discussed with and seen by Dr. Rogene Houston who agrees with the above plan to discharge.   Patient disposition:home Patient condition: good. The patient appears reasonably screened and/or stabilized for discharge and I doubt any other medical condition or other [facility] requiring further screening, evaluation, or treatment in the ED at this time prior to discharge. I have discussed lab and/or imaging findings with the patient and answered all questions/concerns to the best of my ability. I have discussed return precautions and OP follow up.      Final Clinical Impressions(s) / ED Diagnoses   Final diagnoses:  Acute bilateral thoracic back pain  Nausea    ED Discharge Orders    None       Margarita Mail, PA-C 03/05/19 1021    Fredia Sorrow, MD 03/22/19 2309

## 2019-04-29 ENCOUNTER — Ambulatory Visit (INDEPENDENT_AMBULATORY_CARE_PROVIDER_SITE_OTHER): Payer: No Typology Code available for payment source | Admitting: Orthopaedic Surgery

## 2019-04-29 ENCOUNTER — Ambulatory Visit (INDEPENDENT_AMBULATORY_CARE_PROVIDER_SITE_OTHER): Payer: Non-veteran care

## 2019-04-29 ENCOUNTER — Other Ambulatory Visit: Payer: Self-pay

## 2019-04-29 ENCOUNTER — Encounter: Payer: Self-pay | Admitting: Orthopaedic Surgery

## 2019-04-29 ENCOUNTER — Ambulatory Visit: Payer: Self-pay | Admitting: Physician Assistant

## 2019-04-29 DIAGNOSIS — Z96651 Presence of right artificial knee joint: Secondary | ICD-10-CM

## 2019-04-29 DIAGNOSIS — G8929 Other chronic pain: Secondary | ICD-10-CM

## 2019-04-29 DIAGNOSIS — M1712 Unilateral primary osteoarthritis, left knee: Secondary | ICD-10-CM | POA: Diagnosis not present

## 2019-04-29 DIAGNOSIS — M25562 Pain in left knee: Secondary | ICD-10-CM

## 2019-04-29 MED ORDER — METHYLPREDNISOLONE ACETATE 40 MG/ML IJ SUSP
40.0000 mg | INTRAMUSCULAR | Status: AC | PRN
  Administered 2019-04-29: 40 mg via INTRA_ARTICULAR

## 2019-04-29 MED ORDER — LIDOCAINE HCL 1 % IJ SOLN
3.0000 mL | INTRAMUSCULAR | Status: AC | PRN
  Administered 2019-04-29: 3 mL

## 2019-04-29 NOTE — Progress Notes (Signed)
Office Visit Note   Patient: Kayla Miles           Date of Birth: Apr 11, 1951           MRN: [id number] Visit Date: 04/29/2019              Requested by: Henreitta Cea, MD No address on file PCP: Henreitta Cea, MD   Assessment & Plan: Visit Diagnoses:  1. History of total right knee replacement   2. Unilateral primary osteoarthritis, left knee   3. Chronic pain of left knee     Plan: Per her wishes I did provide a steroid injection in her left knee that she tolerated well.  She understands the risk and benefits of injections.  We had a long and thorough discussion about her right operative knee and the things that we need to bring her back for that right knee.  If things start worsening for her left knee she will let us know.  All question concerns were answered and addressed.  Follow-up as otherwise as needed.  Follow-Up Instructions: Return if symptoms worsen or fail to improve.   Orders:  Orders Placed This Encounter  Procedures  . Large Joint Inj  . XR Knee 1-2 Views Right   No orders of the defined types were placed in this encounter.     Procedures: Large Joint Inj: L knee on 04/29/2019 9:55 AM Indications: diagnostic evaluation and pain Details: 22 G 1.5 in needle, superolateral approach  Arthrogram: No  Medications: 3 mL lidocaine 1 %; 40 mg methylPREDNISolone acetate 40 MG/ML Outcome: tolerated well, no immediate complications Procedure, treatment alternatives, risks and benefits explained, specific risks discussed. Consent was given by the patient. Immediately prior to procedure a time out was called to verify the correct patient, procedure, equipment, support staff and site/side marked as required. Patient was prepped and draped in the usual sterile fashion.       Clinical Data: No additional findings.   Subjective: Chief Complaint  Patient presents with  . Right Knee - Follow-up  The patient is well-known to [place].  She is 11 months  out from a right total knee arthroplasty.  She has known osteoarthritis in her left knee and right now she stated stable but would like to have a steroid injection in that knee today.  She is considering her left knee replacement may be next year.  She says her right knee is doing well overall.  She is active 68 year old female.  She denies any acute changes in her medical status or issues.  HPI  Review of Systems She currently denies any headache, chest pain, shortness of breath, fever, chills, nausea, vomiting  Objective: Vital Signs: There were no vitals taken for this visit.  Physical Exam She is alert and orient x3 and in no acute distress Ortho Exam Examination of her right knee shows full extension to full flexion.  The knee is ligamentously stable.  There is no significant swelling.  There is no redness.  Her left knee does show medial joint line tenderness with varus malalignment good but good range of motion.  There is pain throughout the arc of motion. Specialty Comments:  No specialty comments available.  Imaging: Xr Knee 1-2 Views Right  Result Date: 04/29/2019 Standing x-rays of the right knee show well-seated total knee arthroplasty with no complicating features.  There is no effusion or malalignment.  Standing views do show the left knee and there is varus malalignment with significant narrowing  of the medial joint space.    PMFS History: Patient Active Problem List   Diagnosis Date Noted  . Status post total right knee replacement 07/12/2018  . Primary osteoarthritis of left knee 04/11/2018  . Unilateral primary osteoarthritis, right knee 03/07/2018  . Chronic pain of right knee 03/07/2018  . Chronic pain of left knee 03/07/2018  . Malignant neoplasm of central portion of left female breast ([place]) 04/16/2014   Past Medical History:  Diagnosis Date  . Arthralgia of knee, right   . Arthritis    left knee osteoarthritis   . Breast cancer ([place]) 03/26/14   Left  .  HTN (hypertension)   . Hypothyroidism   . Pre-diabetes     Family History  Problem Relation Age of Onset  . Colon cancer Father 65  . Colon cancer Paternal Uncle     Past Surgical History:  Procedure Laterality Date  . BREAST IMPLANT EXCHANGE Bilateral 05/03/2018  . MASTECTOMY Bilateral 2015   with reconstruction.   Marland Kitchen TOTAL KNEE ARTHROPLASTY Right 05/29/2018  . TOTAL KNEE ARTHROPLASTY Right 05/29/2018   Procedure: RIGHT  TOTAL KNEE ARTHROPLASTY;  Surgeon: [name], MD;  Location: [place];  Service: Orthopedics;  Laterality: Right;   Social History   Occupational History  . Not on file  Tobacco Use  . Smoking status: Never Smoker  . Smokeless tobacco: Never Used  Substance and Sexual Activity  . Alcohol use: No  . Drug use: Never  . Sexual activity: Not on file

## 2019-12-11 ENCOUNTER — Telehealth: Payer: Self-pay

## 2019-12-11 NOTE — Telephone Encounter (Signed)
Patient came into office today about an outstanding bill of approximately $580 that she was charged for her last OV with Dr Ninfa Linden from 04/2019.  She said that our office did not obtain auth from the [place] for this appointment and she was charged. She also states that she was turned over to credit agency. She would like documentation sent to her home address if possible that this has been taken care of.

## 2020-03-30 ENCOUNTER — Other Ambulatory Visit: Payer: Self-pay

## 2020-03-30 ENCOUNTER — Ambulatory Visit (INDEPENDENT_AMBULATORY_CARE_PROVIDER_SITE_OTHER): Payer: No Typology Code available for payment source | Admitting: Orthopaedic Surgery

## 2020-03-30 ENCOUNTER — Ambulatory Visit (INDEPENDENT_AMBULATORY_CARE_PROVIDER_SITE_OTHER): Payer: No Typology Code available for payment source

## 2020-03-30 ENCOUNTER — Encounter: Payer: Self-pay | Admitting: Orthopaedic Surgery

## 2020-03-30 VITALS — Ht 64.0 in | Wt 197.6 lb

## 2020-03-30 DIAGNOSIS — G8929 Other chronic pain: Secondary | ICD-10-CM

## 2020-03-30 DIAGNOSIS — M25562 Pain in left knee: Secondary | ICD-10-CM

## 2020-03-30 DIAGNOSIS — M1712 Unilateral primary osteoarthritis, left knee: Secondary | ICD-10-CM | POA: Diagnosis not present

## 2020-03-30 NOTE — Progress Notes (Signed)
Office Visit Note   Patient: Kayla Miles           Date of Birth: 1950-12-18           MRN: [id number] Visit Date: 03/30/2020              Requested by: Henreitta Cea, [facility] [place],  [place] [address] PCP: Henreitta Cea, MD   Assessment & Plan: Visit Diagnoses:  1. Chronic pain of left knee   2. Unilateral primary osteoarthritis, left knee     Plan: At this point she does wish to proceed with a total knee arthroplasty of her left knee and I agree with this as well.  She has had this before on the right side so she is fully aware of the interoperative and postoperative course as well as what the risk and benefits of surgery are for total knee replacement surgery.  I agree with proceeding with this in the next few months given the success of her right total knee arthroplasty and given the severe arthritis we are seeing on the left knee combined with the very conservative treatment for over a year.  Follow-Up Instructions: Return for 2 weeks post-op.   Orders:  Orders Placed This Encounter  Procedures  . XR Knee 1-2 Views Left   No orders of the defined types were placed in this encounter.     Procedures: No procedures performed   Clinical Data: No additional findings.   Subjective: Chief Complaint  Patient presents with  . Left Knee - Pain  The patient is a very pleasant 69 year old female well-known to me.  She has well known and documented severe arthritis of her left knee.  We actually replaced her right knee in June 2019 and that has done very well.  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  At this point she has tried and failed conservative treatment for well over a year now.  This includes activity modification, quad strengthening exercises for her left knee, anti-inflammatories, steroid injections and hyaluronic acid.  At this point her left knee pain is now become daily and it is definitely affecting her  mobility, her quality of life and her actives daily living.  HPI  Review of Systems She currently denies any headache, chest pain, shortness of breath, fever, chills, nausea, vomiting  Objective: Vital Signs: Ht 5\' 4"  (1.626 m)   Wt 197 lb 9.6 oz (89.6 kg)   BMI 33.92 kg/m   Physical Exam She is alert and orient x3 and in no acute distress Ortho Exam Examination of her left knee does show some varus malalignment.  There is significant patellofemoral rotation and painful range of motion for flexion extension.  There is a mild effusion.  The knee is ligamentously stable. Specialty Comments:  No specialty comments available.  Imaging: XR Knee 1-2 Views Left  Result Date: 03/30/2020 2 views left knee show severe arthritic changes involving the medial, lateral and patellofemoral joint spaces.  There are periarticular osteophytes.  There is varus malalignment and the medial side is bone-on-bone in terms of the wear.    PMFS History: Patient Active Problem List   Diagnosis Date Noted  . Status post total right knee replacement 07/12/2018  . Primary osteoarthritis of left knee 04/11/2018  . Unilateral primary osteoarthritis, right knee 03/07/2018  . Chronic pain of right knee 03/07/2018  . Chronic pain of left knee 03/07/2018  . Malignant neoplasm of central  portion of left female breast ([place]) 04/16/2014   Past Medical History:  Diagnosis Date  . Arthralgia of knee, right   . Arthritis    left knee osteoarthritis   . Breast cancer ([place]) 03/26/14   Left  . HTN (hypertension)   . Hypothyroidism   . Pre-diabetes     Family History  Problem Relation Age of Onset  . Colon cancer Father 48  . Colon cancer Paternal Uncle     Past Surgical History:  Procedure Laterality Date  . BREAST IMPLANT EXCHANGE Bilateral 05/03/2018  . MASTECTOMY Bilateral 2015   with reconstruction.   Marland Kitchen TOTAL KNEE ARTHROPLASTY Right 05/29/2018  . TOTAL KNEE ARTHROPLASTY Right 05/29/2018   Procedure:  RIGHT  TOTAL KNEE ARTHROPLASTY;  Surgeon: [name], MD;  Location: [place];  Service: Orthopedics;  Laterality: Right;   Social History   Occupational History  . Not on file  Tobacco Use  . Smoking status: Never Smoker  . Smokeless tobacco: Never Used  Substance and Sexual Activity  . Alcohol use: No  . Drug use: Never  . Sexual activity: Not on file

## 2020-05-04 ENCOUNTER — Other Ambulatory Visit: Payer: Self-pay

## 2020-05-06 ENCOUNTER — Other Ambulatory Visit: Payer: Self-pay | Admitting: Physician Assistant

## 2020-05-14 ENCOUNTER — Telehealth: Payer: Self-pay | Admitting: Orthopaedic Surgery

## 2020-05-14 NOTE — Telephone Encounter (Signed)
Made in error

## 2020-05-14 NOTE — Pre-Procedure Instructions (Signed)
[id number] [place], [place] [id number] [place] [phone] Phone: [phone] Fax: [phone]     Your procedure is scheduled on Tuesday, May 19, 2020 from 2:03 PM - 4:00 PM.  Report to [facility] Main Entrance "A" at 12:00 A.M., and check in at the Admitting office.  Call this number if you have problems the morning of surgery:  [phone]  Call [phone] if you have any questions prior to your surgery date Monday-Friday 8am-4pm.    Remember:  Do not eat after midnight the night before your surgery.  You may drink clear liquids until 11:00 AM the morning of your surgery.   Clear liquids allowed are: Water, Non-Citrus Juices (without pulp), Carbonated Beverages, Clear Tea, Black Coffee Only, and Gatorade.   Enhanced Recovery after Surgery for Orthopedics Enhanced Recovery after Surgery is a protocol used to improve the stress on your body and your recovery after surgery.  . The day of surgery (if you have diabetes): o  o Drink ONE (1) Gatorade 2 (G2) by 11:00 AM. o This drink was given to you during your hospital  pre-op appointment visit.  o Color of the Gatorade may vary. Red is not allowed. o Nothing else to drink after completing the  Gatorade 2 (G2).         If you have questions, please contact your surgeon's office.     Take these medicines the morning of surgery with A SIP OF WATER: amLODipine (NORVASC)  levothyroxine (SYNTHROID)  As of today, STOP taking any Aspirin (unless otherwise instructed by your surgeon) and Aspirin containing products, Aleve, Naproxen, Ibuprofen, Motrin, Advil, Goody's, BC's, all herbal medications, fish oil, and all vitamins.          The Morning of Surgery:            Do not wear jewelry, make up, or nail polish.            Do not wear lotions, powders, perfumes, or deodorant.            Do not shave 48 hours prior to surgery.             Do not  bring valuables to the hospital.            [facility] is not responsible for any belongings or valuables.  Do NOT Smoke (Tobacco/Vapping) or drink Alcohol 24 hours prior to your procedure.  If you use a CPAP at night, you may bring all equipment for your overnight stay.   Contacts, glasses, dentures or bridgework may not be worn into surgery.      For patients admitted to the hospital, discharge time will be determined by your treatment team.   Patients discharged the day of surgery will not be allowed to drive home, and someone needs to stay with them for 24 hours.    Special instructions:   [facility]- Preparing For Surgery  Before surgery, you can play an important role. Because skin is not sterile, your skin needs to be as free of germs as possible. You can reduce the number of germs on your skin by washing with CHG (chlorahexidine gluconate) Soap before surgery.  CHG is an antiseptic cleaner which kills germs and bonds with the skin to continue killing germs even after washing.    Oral Hygiene is also important to reduce your risk of infection.  Remember - BRUSH YOUR TEETH THE  MORNING OF SURGERY WITH YOUR REGULAR TOOTHPASTE  Please do not use if you have an allergy to CHG or antibacterial soaps. If your skin becomes reddened/irritated stop using the CHG.  Do not shave (including legs and underarms) for at least 48 hours prior to first CHG shower. It is OK to shave your face.  Please follow these instructions carefully.   1. Shower the NIGHT BEFORE SURGERY and the MORNING OF SURGERY with CHG Soap.   2. If you chose to wash your hair, wash your hair first as usual with your normal shampoo.  3. After you shampoo, rinse your hair and body thoroughly to remove the shampoo.  4. Use CHG as you would any other liquid soap. You can apply CHG directly to the skin and wash gently with a scrungie or a clean washcloth.   5. Apply the CHG Soap to your body ONLY FROM THE NECK DOWN.  Do  not use on open wounds or open sores. Avoid contact with your eyes, ears, mouth and genitals (private parts). Wash Face and genitals (private parts)  with your normal soap.   6. Wash thoroughly, paying special attention to the area where your surgery will be performed.  7. Thoroughly rinse your body with warm water from the neck down.  8. DO NOT shower/wash with your normal soap after using and rinsing off the CHG Soap.  9. Pat yourself dry with a CLEAN TOWEL.  10. Wear CLEAN PAJAMAS to bed the night before surgery, wear comfortable clothes the morning of surgery  11. Place CLEAN SHEETS on your bed the night of your first shower and DO NOT SLEEP WITH PETS.   Day of Surgery:             Shower with CHG Soap Do not apply any deodorants/lotions.  Please wear clean clothes to the hospital/surgery center.   Remember to brush your teeth WITH YOUR REGULAR TOOTHPASTE.   Please read over the following fact sheets that you were given.

## 2020-05-15 ENCOUNTER — Other Ambulatory Visit: Payer: Self-pay

## 2020-05-15 ENCOUNTER — Encounter (HOSPITAL_COMMUNITY)
Admission: RE | Admit: 2020-05-15 | Discharge: 2020-05-15 | Disposition: A | Discharge status: Home / Self Care | Payer: No Typology Code available for payment source | Source: Ambulatory Visit | Attending: Orthopaedic Surgery | Admitting: Orthopaedic Surgery

## 2020-05-15 ENCOUNTER — Telehealth: Payer: Self-pay | Admitting: Orthopaedic Surgery

## 2020-05-15 ENCOUNTER — Other Ambulatory Visit (HOSPITAL_COMMUNITY)
Admission: RE | Admit: 2020-05-15 | Discharge: 2020-05-15 | Disposition: A | Discharge status: Home / Self Care | Payer: No Typology Code available for payment source | Source: Ambulatory Visit | Attending: Orthopaedic Surgery | Admitting: Orthopaedic Surgery

## 2020-05-15 ENCOUNTER — Encounter (HOSPITAL_COMMUNITY): Payer: Self-pay

## 2020-05-15 DIAGNOSIS — Z20822 Contact with and (suspected) exposure to covid-19: Secondary | ICD-10-CM | POA: Insufficient documentation

## 2020-05-15 DIAGNOSIS — Z01818 Encounter for other preprocedural examination: Secondary | ICD-10-CM | POA: Diagnosis present

## 2020-05-15 LAB — BASIC METABOLIC PANEL
Anion gap: 8 (ref 5–15)
BUN: 16 mg/dL (ref 8–23)
CO2: 27 mmol/L (ref 22–32)
Calcium: 8.9 mg/dL (ref 8.9–10.3)
Chloride: 108 mmol/L (ref 98–111)
Creatinine, Ser: 0.85 mg/dL (ref 0.44–1.00)
GFR calc Af Amer: >60 mL/min (ref >60)
GFR calc non Af Amer: >60 mL/min (ref >60)
Glucose, Bld: 98 mg/dL (ref 70–99)
Potassium: 4.2 mmol/L (ref 3.5–5.1)
Sodium: 143 mmol/L (ref 135–145)

## 2020-05-15 LAB — TYPE AND SCREEN
ABO/RH(D): O POS
Antibody Screen: NEGATIVE

## 2020-05-15 LAB — ABO/RH: ABO/RH(D): O POS

## 2020-05-15 LAB — HEMOGLOBIN A1C
Hgb A1c MFr Bld: 6.3 % — ABNORMAL HIGH (ref 4.8–5.6)
Mean Plasma Glucose: 134.11 mg/dL

## 2020-05-15 LAB — CBC
HCT: 40.1 % (ref 36.0–46.0)
Hemoglobin: 12.4 g/dL (ref 12.0–15.0)
MCH: 22.8 pg — ABNORMAL LOW (ref 26.0–34.0)
MCHC: 30.9 g/dL (ref 30.0–36.0)
MCV: 73.6 fL — ABNORMAL LOW (ref 80.0–100.0)
Platelets: 286 10*3/uL (ref 150–400)
RBC: 5.45 MIL/uL — ABNORMAL HIGH (ref 3.87–5.11)
RDW: 16.5 % — ABNORMAL HIGH (ref 11.5–15.5)
WBC: 6.3 10*3/uL (ref 4.0–10.5)
nRBC: 0 % (ref 0.0–0.2)

## 2020-05-15 LAB — SARS CORONAVIRUS 2 (TAT 6-24 HRS): SARS Coronavirus 2: NEGATIVE

## 2020-05-15 LAB — SURGICAL PCR SCREEN
MRSA, PCR: NEGATIVE
Staphylococcus aureus: NEGATIVE

## 2020-05-15 NOTE — Progress Notes (Signed)
PCP - Arletha Grippe, MD (had physical done recently by Dr. Arletha Grippe, but Dr. Nyoka Cowden is taking over per pt) [facility] Cardiologist - pt denies  PPM/ICD - n/a Device Orders - n/a Rep Notified - n/a  Chest x-ray - n/a EKG - 05/15/20 Stress Test - pt had one done in the past but does not recall - looked in care-everywhere and found "cardiology scan" from [facility] - documents requested ECHO - pt had one done in the past but does not recall - looked in care-everywhere and found "cardiology scan" from [facility] - documents requested Cardiac Cath - pt denies  Sleep Study - n/a CPAP - n/a  Per chart, pt noted to be pre-diabetic, but during PAT, pt insisted she does not have pre-diabetes. "It doesn't even run in my family and I've never been told that I have high blood sugars, I don't take anything for my blood sugars either. 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   Blood Thinner Instructions: n/a Aspirin Instructions: n/a - pt not taking ASA  ERAS Protcol - yes PRE-SURGERY Ensure or G2- ensure   COVID TEST- 05/15/20   Anesthesia review: documents requested   Patient denies shortness of breath, fever, cough and chest pain at PAT appointment   All instructions explained to the patient, with a verbal understanding of the material. Patient agrees to go over the instructions while at home for a better understanding. Patient also instructed to self quarantine after being tested for COVID-19. The opportunity to ask questions was provided.

## 2020-05-15 NOTE — Telephone Encounter (Signed)
Faxed 03/30/20 ov note w/ [facility] care referral request for services form [phone]

## 2020-05-18 NOTE — Anesthesia Preprocedure Evaluation (Addendum)
Anesthesia Evaluation    Airway Mallampati: I       Dental no notable dental hx. (+) Teeth Intact   Pulmonary neg pulmonary ROS,    breath sounds clear to auscultation       Cardiovascular hypertension, Pt. on medications Normal cardiovascular exam Rhythm:Regular Rate:Normal     Neuro/Psych negative neurological ROS  negative psych ROS   GI/Hepatic negative GI ROS, Neg liver ROS,   Endo/Other  Hypothyroidism   Renal/GU negative Renal ROS     Musculoskeletal  (+) Arthritis , Osteoarthritis,    Abdominal (+) + obese,   Peds  Hematology   Anesthesia Other Findings   Reproductive/Obstetrics                            Anesthesia Physical Anesthesia Plan  ASA: II  Anesthesia Plan: Spinal   Post-op Pain Management:  Regional for Post-op pain   Induction:   PONV Risk Score and Plan: 2 and Ondansetron and Dexamethasone  Airway Management Planned: Natural Airway and Mask  Additional Equipment: None  Intra-op Plan:   Post-operative Plan:   Informed Consent: I have reviewed the patients History and Physical, chart, labs and discussed the procedure including the risks, benefits and alternatives for the proposed anesthesia with the patient or authorized representative who has indicated his/her understanding and acceptance.       Plan Discussed with: CRNA  Anesthesia Plan Comments: (PAT note by Karoline Caldwell, PA-C: Preop labs reviewed. A1c 6.3 consistent with prediabetes. Remainder of labs unremarkable.   Underwent same procedure on contralateral knee 2/50/0370 without complication.  EKG 05/15/20: Normal sinus rhythm. Rate 66. Nonspecific T wave abnormality. No significant change since last tracing  Cardiac MRI 01/06/16 (done for monitoring due to hx of chemotherapy): IMPRESSION: 1. Normal left ventricular size and wall thickness with low normal systolic function (LVEF = 51%). There is  mild diffuse hypokinesis in the basal and mid segments.  2. Normal right ventricular size, thickness and systolic function (RVEF = 56%).  3. Mild left atrial dilatation.  4. Mild mitral and trivial tricuspid regurgitation.  5. Normal size of the aortic root, aorta and pulmonary artery.)       Anesthesia Quick Evaluation

## 2020-05-18 NOTE — Progress Notes (Signed)
Anesthesia Chart Review:  Preop labs reviewed. A1c 6.3 consistent with prediabetes. Remainder of labs unremarkable.   Underwent same procedure on contralateral knee 5/48/6282 without complication.  EKG 05/15/20: Normal sinus rhythm. Rate 66. Nonspecific T wave abnormality. No significant change since last tracing  Cardiac MRI 01/06/16 (done for monitoring due to hx of chemotherapy): IMPRESSION: 1. Normal left ventricular size and wall thickness with low normal systolic function (LVEF = 51%). There is mild diffuse hypokinesis in the basal and mid segments.  2. Normal right ventricular size, thickness and systolic function (RVEF = 56%).  3. Mild left atrial dilatation.  4. Mild mitral and trivial tricuspid regurgitation.  5. Normal size of the aortic root, aorta and pulmonary artery.   Wynonia Musty [facility] Short Stay Center/Anesthesiology Phone [phone] 05/18/2020 9:58 AM

## 2020-05-19 ENCOUNTER — Ambulatory Visit (HOSPITAL_COMMUNITY): Payer: No Typology Code available for payment source | Admitting: Certified Registered"

## 2020-05-19 ENCOUNTER — Other Ambulatory Visit: Payer: Self-pay

## 2020-05-19 ENCOUNTER — Ambulatory Visit (HOSPITAL_COMMUNITY): Payer: No Typology Code available for payment source | Admitting: Physician Assistant

## 2020-05-19 ENCOUNTER — Telehealth: Payer: Self-pay | Admitting: Orthopaedic Surgery

## 2020-05-19 ENCOUNTER — Encounter (HOSPITAL_COMMUNITY): Payer: Self-pay | Admitting: Orthopaedic Surgery

## 2020-05-19 ENCOUNTER — Encounter (HOSPITAL_COMMUNITY)
Admission: RE | Disposition: A | Discharge status: Home / Self Care | Payer: Self-pay | Source: Home / Self Care | Attending: Orthopaedic Surgery

## 2020-05-19 ENCOUNTER — Observation Stay (HOSPITAL_COMMUNITY): Payer: No Typology Code available for payment source

## 2020-05-19 ENCOUNTER — Observation Stay (HOSPITAL_COMMUNITY)
Admission: RE | Admit: 2020-05-19 | Discharge: 2020-05-20 | Disposition: A | Discharge status: Home / Self Care | Payer: No Typology Code available for payment source | Attending: Orthopaedic Surgery | Admitting: Orthopaedic Surgery

## 2020-05-19 DIAGNOSIS — Z96652 Presence of left artificial knee joint: Secondary | ICD-10-CM

## 2020-05-19 DIAGNOSIS — I10 Essential (primary) hypertension: Secondary | ICD-10-CM | POA: Insufficient documentation

## 2020-05-19 DIAGNOSIS — E039 Hypothyroidism, unspecified: Secondary | ICD-10-CM | POA: Insufficient documentation

## 2020-05-19 DIAGNOSIS — R7303 Prediabetes: Secondary | ICD-10-CM | POA: Diagnosis not present

## 2020-05-19 DIAGNOSIS — M170 Bilateral primary osteoarthritis of knee: Secondary | ICD-10-CM | POA: Diagnosis not present

## 2020-05-19 DIAGNOSIS — Z853 Personal history of malignant neoplasm of breast: Secondary | ICD-10-CM | POA: Insufficient documentation

## 2020-05-19 DIAGNOSIS — M1712 Unilateral primary osteoarthritis, left knee: Secondary | ICD-10-CM | POA: Diagnosis present

## 2020-05-19 HISTORY — PX: TOTAL KNEE ARTHROPLASTY: SHX125

## 2020-05-19 LAB — GLUCOSE, CAPILLARY: Glucose-Capillary: 135 mg/dL — ABNORMAL HIGH (ref 70–99)

## 2020-05-19 SURGERY — ARTHROPLASTY, KNEE, TOTAL
Anesthesia: Spinal | Site: Knee | Laterality: Left

## 2020-05-19 MED ORDER — KETOROLAC TROMETHAMINE 30 MG/ML IJ SOLN
INTRAMUSCULAR | Status: AC
  Filled 2020-05-19: qty 1

## 2020-05-19 MED ORDER — ALUM & MAG HYDROXIDE-SIMETH 200-200-20 MG/5ML PO SUSP: 30.0000 mL | ORAL | Status: DC | PRN

## 2020-05-19 MED ORDER — FENTANYL CITRATE (PF) 250 MCG/5ML IJ SOLN
INTRAMUSCULAR | Status: AC
  Filled 2020-05-19: qty 5

## 2020-05-19 MED ORDER — BIOTIN 10000 MCG PO TABS: 10000.0000 ug | ORAL_TABLET | Freq: Every day | ORAL | Status: DC

## 2020-05-19 MED ORDER — METHOCARBAMOL 1000 MG/10ML IJ SOLN
500.0000 mg | Freq: Four times a day (QID) | INTRAVENOUS | Status: DC | PRN
  Filled 2020-05-19: qty 5

## 2020-05-19 MED ORDER — LEVOTHYROXINE SODIUM 88 MCG PO TABS
88.0000 ug | ORAL_TABLET | Freq: Every day | ORAL | Status: DC
  Administered 2020-05-20: 88 ug via ORAL
  Filled 2020-05-19: qty 1

## 2020-05-19 MED ORDER — LACTATED RINGERS IV SOLN: INTRAVENOUS | Status: DC | PRN

## 2020-05-19 MED ORDER — ONDANSETRON HCL 4 MG/2ML IJ SOLN
4.0000 mg | Freq: Four times a day (QID) | INTRAMUSCULAR | Status: DC | PRN
  Administered 2020-05-19 – 2020-05-20 (×2): 4 mg via INTRAVENOUS
  Filled 2020-05-19 (×2): qty 2

## 2020-05-19 MED ORDER — CLONIDINE HCL (ANALGESIA) 100 MCG/ML EP SOLN
EPIDURAL | Status: DC | PRN
Start: 2020-05-19 — End: 2020-05-19
  Administered 2020-05-19: 50 ug

## 2020-05-19 MED ORDER — MENTHOL 3 MG MT LOZG: 1.0000 | LOZENGE | OROMUCOSAL | Status: DC | PRN

## 2020-05-19 MED ORDER — SODIUM CHLORIDE 0.9 % IR SOLN
Status: DC | PRN
  Administered 2020-05-19: 3000 mL

## 2020-05-19 MED ORDER — PROPOFOL 10 MG/ML IV BOLUS
INTRAVENOUS | Status: AC
  Filled 2020-05-19: qty 20

## 2020-05-19 MED ORDER — SODIUM CHLORIDE 0.9 % IV SOLN: INTRAVENOUS | Status: DC

## 2020-05-19 MED ORDER — ACETAMINOPHEN 325 MG PO TABS: 325.0000 mg | ORAL_TABLET | Freq: Four times a day (QID) | ORAL | Status: DC | PRN

## 2020-05-19 MED ORDER — 0.9 % SODIUM CHLORIDE (POUR BTL) OPTIME
TOPICAL | Status: DC | PRN
  Administered 2020-05-19: 1000 mL

## 2020-05-19 MED ORDER — PANTOPRAZOLE SODIUM 40 MG PO TBEC
40.0000 mg | DELAYED_RELEASE_TABLET | Freq: Every day | ORAL | Status: DC
  Administered 2020-05-20: 40 mg via ORAL
  Filled 2020-05-19: qty 1

## 2020-05-19 MED ORDER — PROMETHAZINE HCL 25 MG/ML IJ SOLN: 6.2500 mg | INTRAMUSCULAR | Status: DC | PRN

## 2020-05-19 MED ORDER — OXYCODONE HCL 5 MG PO TABS
5.0000 mg | ORAL_TABLET | ORAL | Status: DC | PRN
  Administered 2020-05-19 – 2020-05-20 (×3): 10 mg via ORAL
  Filled 2020-05-19 (×3): qty 2

## 2020-05-19 MED ORDER — ADULT MULTIVITAMIN W/MINERALS CH
1.0000 | ORAL_TABLET | Freq: Every day | ORAL | Status: DC
  Administered 2020-05-20: 1 via ORAL
  Filled 2020-05-19: qty 1

## 2020-05-19 MED ORDER — FENTANYL CITRATE (PF) 100 MCG/2ML IJ SOLN
INTRAMUSCULAR | Status: AC
  Administered 2020-05-19: 100 ug via INTRAVENOUS
  Filled 2020-05-19: qty 2

## 2020-05-19 MED ORDER — ZOLPIDEM TARTRATE 5 MG PO TABS: 5.0000 mg | ORAL_TABLET | Freq: Every evening | ORAL | Status: DC | PRN

## 2020-05-19 MED ORDER — AMLODIPINE BESYLATE 5 MG PO TABS: 5.0000 mg | ORAL_TABLET | Freq: Every day | ORAL | Status: DC

## 2020-05-19 MED ORDER — METHOCARBAMOL 500 MG PO TABS: 500.0000 mg | ORAL_TABLET | Freq: Four times a day (QID) | ORAL | Status: DC | PRN

## 2020-05-19 MED ORDER — MEPERIDINE HCL 25 MG/ML IJ SOLN: 6.2500 mg | INTRAMUSCULAR | Status: DC | PRN

## 2020-05-19 MED ORDER — KETOROLAC TROMETHAMINE 30 MG/ML IJ SOLN
30.0000 mg | Freq: Once | INTRAMUSCULAR | Status: AC | PRN
  Administered 2020-05-19: 30 mg via INTRAVENOUS

## 2020-05-19 MED ORDER — PHENYLEPHRINE HCL-NACL 10-0.9 MG/250ML-% IV SOLN
INTRAVENOUS | Status: DC | PRN
Start: 2020-05-19 — End: 2020-05-19
  Administered 2020-05-19: 30 ug/min via INTRAVENOUS

## 2020-05-19 MED ORDER — METOCLOPRAMIDE HCL 5 MG PO TABS: 5.0000 mg | ORAL_TABLET | Freq: Three times a day (TID) | ORAL | Status: DC | PRN

## 2020-05-19 MED ORDER — POVIDONE-IODINE 10 % EX SWAB
2.0000 "application " | Freq: Once | CUTANEOUS | Status: AC
  Administered 2020-05-19: 2 via TOPICAL

## 2020-05-19 MED ORDER — ROPIVACAINE HCL 7.5 MG/ML IJ SOLN
INTRAMUSCULAR | Status: DC | PRN
Start: 2020-05-19 — End: 2020-05-19
  Administered 2020-05-19 (×4): 5 mL via PERINEURAL

## 2020-05-19 MED ORDER — ONDANSETRON HCL 4 MG/2ML IJ SOLN
INTRAMUSCULAR | Status: DC | PRN
  Administered 2020-05-19: 4 mg via INTRAVENOUS

## 2020-05-19 MED ORDER — BUPIVACAINE IN DEXTROSE 0.75-8.25 % IT SOLN
INTRATHECAL | Status: DC | PRN
  Administered 2020-05-19: 1.6 mL via INTRATHECAL

## 2020-05-19 MED ORDER — CHLORHEXIDINE GLUCONATE 0.12 % MT SOLN
15.0000 mL | Freq: Once | OROMUCOSAL | Status: AC
  Administered 2020-05-19: 15 mL via OROMUCOSAL
  Filled 2020-05-19: qty 15

## 2020-05-19 MED ORDER — ASPIRIN EC 325 MG PO TBEC
325.0000 mg | DELAYED_RELEASE_TABLET | Freq: Two times a day (BID) | ORAL | Status: DC
  Administered 2020-05-19 – 2020-05-20 (×2): 325 mg via ORAL
  Filled 2020-05-19 (×2): qty 1

## 2020-05-19 MED ORDER — CEFAZOLIN SODIUM-DEXTROSE 1-4 GM/50ML-% IV SOLN
1.0000 g | Freq: Four times a day (QID) | INTRAVENOUS | Status: AC
  Administered 2020-05-19 – 2020-05-20 (×2): 1 g via INTRAVENOUS
  Filled 2020-05-19 (×2): qty 50

## 2020-05-19 MED ORDER — CEFAZOLIN SODIUM-DEXTROSE 2-4 GM/100ML-% IV SOLN
2.0000 g | INTRAVENOUS | Status: AC
  Administered 2020-05-19: 2 g via INTRAVENOUS
  Filled 2020-05-19: qty 100

## 2020-05-19 MED ORDER — POLYETHYLENE GLYCOL 3350 17 G PO PACK: 17.0000 g | PACK | Freq: Every day | ORAL | Status: DC | PRN

## 2020-05-19 MED ORDER — PROPOFOL 500 MG/50ML IV EMUL
INTRAVENOUS | Status: DC | PRN
  Administered 2020-05-19: 150 ug/kg/min via INTRAVENOUS

## 2020-05-19 MED ORDER — HYDROMORPHONE HCL 1 MG/ML IJ SOLN
INTRAMUSCULAR | Status: AC
  Filled 2020-05-19: qty 1

## 2020-05-19 MED ORDER — HYDROMORPHONE HCL 1 MG/ML IJ SOLN: 0.5000 mg | INTRAMUSCULAR | Status: DC | PRN

## 2020-05-19 MED ORDER — ONDANSETRON HCL 4 MG PO TABS: 4.0000 mg | ORAL_TABLET | Freq: Four times a day (QID) | ORAL | Status: DC | PRN

## 2020-05-19 MED ORDER — MIDAZOLAM HCL 2 MG/2ML IJ SOLN: 2.0000 mg | Freq: Once | INTRAMUSCULAR | Status: AC

## 2020-05-19 MED ORDER — ROPIVACAINE HCL 5 MG/ML IJ SOLN
INTRAMUSCULAR | Status: DC | PRN
  Administered 2020-05-19 (×2): 5 mL via PERINEURAL

## 2020-05-19 MED ORDER — PROPOFOL 10 MG/ML IV BOLUS
INTRAVENOUS | Status: DC | PRN
  Administered 2020-05-19: 30 mg via INTRAVENOUS

## 2020-05-19 MED ORDER — DOCUSATE SODIUM 100 MG PO CAPS
100.0000 mg | ORAL_CAPSULE | Freq: Two times a day (BID) | ORAL | Status: DC
  Administered 2020-05-19 – 2020-05-20 (×2): 100 mg via ORAL
  Filled 2020-05-19 (×2): qty 1

## 2020-05-19 MED ORDER — FENTANYL CITRATE (PF) 100 MCG/2ML IJ SOLN: 100.0000 ug | Freq: Once | INTRAMUSCULAR | Status: AC

## 2020-05-19 MED ORDER — GABAPENTIN 100 MG PO CAPS
100.0000 mg | ORAL_CAPSULE | Freq: Three times a day (TID) | ORAL | Status: DC
  Administered 2020-05-19 – 2020-05-20 (×3): 100 mg via ORAL
  Filled 2020-05-19 (×3): qty 1

## 2020-05-19 MED ORDER — ORAL CARE MOUTH RINSE: 15.0000 mL | Freq: Once | OROMUCOSAL | Status: AC

## 2020-05-19 MED ORDER — PHENOL 1.4 % MT LIQD: 1.0000 | OROMUCOSAL | Status: DC | PRN

## 2020-05-19 MED ORDER — TRANEXAMIC ACID-NACL 1000-0.7 MG/100ML-% IV SOLN
1000.0000 mg | INTRAVENOUS | Status: AC
  Administered 2020-05-19: 1000 mg via INTRAVENOUS
  Filled 2020-05-19: qty 100

## 2020-05-19 MED ORDER — DIPHENHYDRAMINE HCL 12.5 MG/5ML PO ELIX: 12.5000 mg | ORAL_SOLUTION | ORAL | Status: DC | PRN

## 2020-05-19 MED ORDER — MIDAZOLAM HCL 2 MG/2ML IJ SOLN
INTRAMUSCULAR | Status: AC
  Administered 2020-05-19: 2 mg via INTRAVENOUS
  Filled 2020-05-19: qty 2

## 2020-05-19 MED ORDER — OXYCODONE HCL 5 MG PO TABS: 10.0000 mg | ORAL_TABLET | ORAL | Status: DC | PRN

## 2020-05-19 MED ORDER — HYDROMORPHONE HCL 1 MG/ML IJ SOLN
0.2500 mg | INTRAMUSCULAR | Status: DC | PRN
  Administered 2020-05-19: 0.25 mg via INTRAVENOUS

## 2020-05-19 MED ORDER — METOCLOPRAMIDE HCL 5 MG/ML IJ SOLN: 5.0000 mg | Freq: Three times a day (TID) | INTRAMUSCULAR | Status: DC | PRN

## 2020-05-19 SURGICAL SUPPLY — 65 items
BANDAGE ESMARK 6X9 LF (GAUZE/BANDAGES/DRESSINGS) ×1 IMPLANT
BLADE SAG 18X100X1.27 (BLADE) ×3 IMPLANT
BNDG COHESIVE 4X5 TAN STRL (GAUZE/BANDAGES/DRESSINGS) ×3 IMPLANT
BNDG ELASTIC 6X10 VLCR STRL LF (GAUZE/BANDAGES/DRESSINGS) ×3 IMPLANT
BNDG ELASTIC 6X5.8 VLCR STR LF (GAUZE/BANDAGES/DRESSINGS) ×6 IMPLANT
BNDG ESMARK 6X9 LF (GAUZE/BANDAGES/DRESSINGS) ×3
BOWL SMART MIX CTS (DISPOSABLE) ×3 IMPLANT
CLOSURE WOUND 1/2 X4 (GAUZE/BANDAGES/DRESSINGS)
COMP POSTERIOR FEMORAL SZ4 LFT (Femur) ×3 IMPLANT
COMPONENT PSTRER FEMRL SZ4 LT (Femur) ×1 IMPLANT
COVER SURGICAL LIGHT HANDLE (MISCELLANEOUS) ×3 IMPLANT
COVER WAND RF STERILE (DRAPES) ×3 IMPLANT
CUFF TOURN SGL QUICK 34 (TOURNIQUET CUFF) ×3
CUFF TRNQT CYL 34X4.125X (TOURNIQUET CUFF) ×1 IMPLANT
DRAPE EXTREMITY T 121X128X90 (DISPOSABLE) ×3 IMPLANT
DRAPE HALF SHEET 40X57 (DRAPES) ×3 IMPLANT
DRAPE U-SHAPE 47X51 STRL (DRAPES) ×3 IMPLANT
DRSG PAD ABDOMINAL 8X10 ST (GAUZE/BANDAGES/DRESSINGS) ×3 IMPLANT
DRSG XEROFORM 1X8 (GAUZE/BANDAGES/DRESSINGS) ×3 IMPLANT
DURAPREP 26ML APPLICATOR (WOUND CARE) ×3 IMPLANT
ELECT CAUTERY BLADE 6.4 (BLADE) ×3 IMPLANT
ELECT REM PT RETURN 9FT ADLT (ELECTROSURGICAL) ×3
ELECTRODE REM PT RTRN 9FT ADLT (ELECTROSURGICAL) ×1 IMPLANT
FACESHIELD WRAPAROUND (MASK) ×6 IMPLANT
GAUZE SPONGE 4X4 12PLY STRL (GAUZE/BANDAGES/DRESSINGS) ×3 IMPLANT
GAUZE XEROFORM 1X8 LF (GAUZE/BANDAGES/DRESSINGS) ×3 IMPLANT
GLOVE BIOGEL PI IND STRL 8 (GLOVE) ×2 IMPLANT
GLOVE BIOGEL PI INDICATOR 8 (GLOVE) ×4
GLOVE ORTHO TXT STRL SZ7.5 (GLOVE) ×3 IMPLANT
GLOVE SURG ORTHO 8.0 STRL STRW (GLOVE) ×3 IMPLANT
GOWN STRL REUS W/ TWL LRG LVL3 (GOWN DISPOSABLE) IMPLANT
GOWN STRL REUS W/ TWL XL LVL3 (GOWN DISPOSABLE) ×2 IMPLANT
GOWN STRL REUS W/TWL LRG LVL3 (GOWN DISPOSABLE)
GOWN STRL REUS W/TWL XL LVL3 (GOWN DISPOSABLE) ×6
HANDPIECE INTERPULSE COAX TIP (DISPOSABLE) ×3
IMMOBILIZER KNEE 22 UNIV (SOFTGOODS) ×3 IMPLANT
INSERT TIB TRIATH 12 S3 (Insert) ×3 IMPLANT
KIT BASIN OR (CUSTOM PROCEDURE TRAY) ×3 IMPLANT
KIT TURNOVER KIT B (KITS) ×3 IMPLANT
KNEE PATELLA ASYMMETRIC 9X29 (Knees) ×3 IMPLANT
KNEE TIBIAL COMPONENT SZ3 (Knees) ×3 IMPLANT
MANIFOLD NEPTUNE II (INSTRUMENTS) ×3 IMPLANT
NEEDLE 18GX1X1/2 (RX/OR ONLY) (NEEDLE) IMPLANT
NS IRRIG 1000ML POUR BTL (IV SOLUTION) ×3 IMPLANT
PACK TOTAL JOINT (CUSTOM PROCEDURE TRAY) ×3 IMPLANT
PAD ARMBOARD 7.5X6 YLW CONV (MISCELLANEOUS) ×3 IMPLANT
PADDING CAST COTTON 6X4 STRL (CAST SUPPLIES) ×6 IMPLANT
PIN FLUTED HEDLESS FIX 3.5X1/8 (PIN) ×3 IMPLANT
SET HNDPC FAN SPRY TIP SCT (DISPOSABLE) ×1 IMPLANT
SET PAD KNEE POSITIONER (MISCELLANEOUS) ×3 IMPLANT
STAPLER VISISTAT 35W (STAPLE) IMPLANT
STRIP CLOSURE SKIN 1/2X4 (GAUZE/BANDAGES/DRESSINGS) IMPLANT
SUCTION FRAZIER HANDLE 10FR (MISCELLANEOUS) ×3
SUCTION TUBE FRAZIER 10FR DISP (MISCELLANEOUS) ×1 IMPLANT
SUT MNCRL AB 4-0 PS2 18 (SUTURE) IMPLANT
SUT VIC AB 0 CT1 27 (SUTURE) ×3
SUT VIC AB 0 CT1 27XBRD ANBCTR (SUTURE) ×1 IMPLANT
SUT VIC AB 1 CT1 27 (SUTURE) ×6
SUT VIC AB 1 CT1 27XBRD ANBCTR (SUTURE) ×2 IMPLANT
SUT VIC AB 2-0 CT1 27 (SUTURE) ×6
SUT VIC AB 2-0 CT1 TAPERPNT 27 (SUTURE) ×2 IMPLANT
TOWEL GREEN STERILE (TOWEL DISPOSABLE) ×3 IMPLANT
TOWEL GREEN STERILE FF (TOWEL DISPOSABLE) ×3 IMPLANT
TRAY CATH 16FR W/PLASTIC CATH (SET/KITS/TRAYS/PACK) IMPLANT
WRAP KNEE MAXI GEL POST OP (GAUZE/BANDAGES/DRESSINGS) ×3 IMPLANT

## 2020-05-19 NOTE — Anesthesia Procedure Notes (Signed)
Procedure Name: MAC Date/Time: 05/19/2020 2:15 PM Performed by: Griffin Dakin, CRNA Pre-anesthesia Checklist: Patient identified, Emergency Drugs available, Suction available and Patient being monitored Patient Re-evaluated:Patient Re-evaluated prior to induction Oxygen Delivery Method: Simple face mask Preoxygenation: Pre-oxygenation with 100% oxygen Induction Type: IV induction Number of attempts: 1 Airway Equipment and Method: Oral airway Placement Confirmation: positive ETCO2 and breath sounds checked- equal and bilateral Dental Injury: Teeth and Oropharynx as per pre-operative assessment

## 2020-05-19 NOTE — Op Note (Signed)
NAME: Kayla Miles, GAL MEDICAL RECORD [id number] ACCOUNT [id number] DATE OF BIRTH:1951-02-12 FACILITY: [facility] LOCATION: [id number] PHYSICIAN:Raima Geathers Kerry [place], MD  OPERATIVE REPORT  DATE OF PROCEDURE:  05/19/2020  PREOPERATIVE DIAGNOSIS:  Primary osteoarthritis and degenerative joint disease, left knee.  POSTOPERATIVE DIAGNOSIS:  Primary osteoarthritis and degenerative joint disease, left knee.  PROCEDURE:  Left total knee arthroplasty.  IMPLANTS:  Stryker Triathlon press-fit knee system with size 4 femur, size 3 tibial tray, 12 mm fixed bearing polyethylene insert, size 29 patellar button.  SURGEON:  Lind Guest. Ninfa Linden, MD  ASSISTANT:  Erskine Emery, PA-C.  ANESTHESIA: 1.  Left lower extremity adductor canal block. 2.  Spinal. 3.  Mask ventilation and IV sedation.  ANTIBIOTICS:  Two g of IV Ancef.  TOURNIQUET TIME:  Less than 1 hour.  ESTIMATED BLOOD LOSS:  100 mL.  COMPLICATIONS:  None.  INDICATIONS:  The patient is a very pleasant 69 year old female, well known to me.  She has debilitating arthritis, well documented of her left knee.  We actually replaced her right knee in 2019.  That has done well for her.  At this point, her left knee  pain is daily and is detrimentally affecting her mobility, her quality of life and activities of daily living to the point that she does wish to proceed with a total knee arthroplasty on the left side and we have recommended this to her as well and  agree.  Having had this done before, she is fully aware of the risk of acute blood loss anemia, nerve or vessel injury, fracture, infection, DVT, and implant failure.  She understands our goals are to decrease pain, improve mobility and overall improve  quality of life.  DESCRIPTION OF PROCEDURE:  After informed consent was obtained, the appropriate left knee was marked.  She was brought to the operating room after an adductor canal block was obtained in the holding room.  In  the operating room, she was sat up on the  operating table.  Spinal anesthesia was then obtained.  She was then laid in the supine position on the operating table.  A nonsterile tourniquet was placed around the upper left thigh and her left thigh, knee, leg, ankle and foot were prepped and draped  with DuraPrep and sterile drapes, including a sterile stockinette.  Time-out was called and she was identified as correct patient, correct left knee.  I then used an Esmarch to wrap that leg and tourniquet was inflated to 300 mm of pressure.  I then  made a direct midline incision over the patella and carried this proximally and distally.  I dissected down the knee joint and carried out a medial parapatellar arthrotomy, finding moderate joint effusion and significant osteoarthritis throughout her  left knee.  With the knee in a flexed position, we removed remnants of ACL, PCL, medial and lateral meniscus.  We used our extramedullary cutting guide for making our proximal tibia cut, taking 9 mm off the high side, correcting varus and valgus and  neutral slope.  We made this cut without difficulty.  We then went to the femur and used the intramedullary guide for the femur, setting our distal femoral cut at a 10 mm distal femoral cut due to slight flexion contracture.  We did this for a left knee  at 5 degrees externally rotated.  We made that cut without difficulty and brought the knee back down to full extension and achieved full extension with a 9 mm and then 10 mm extension block.  We then went back to the femur and put our femoral sizing  guide based off the epicondylar axis.  Based off this, we chose a size 4 femur.  We put a 4-in-1 cutting block for a size 4 femur, made our anterior and posterior cuts, followed by our chamfer cuts.  We then made our femoral box cut.  We did feel like  the bone quality was good on the femur for a press-fit femoral component.  We then went back to the tibia and I did take 2 mm  more mm off the tibia and then chose a size 3 tibial tray for coverage.  We set the rotation off the tibial tubercle and the  femur and then made our keel punch off of this.  There was good solid bone here, so we felt that the tibial tray could also be press-fit.  With a size 3 tibial tray and the size 4 left femur, we trialed an 11 mm fixed bearing polyethylene insert.  We  felt that the 12 would be more stable.  She had excellent range of motion with this.  We then sawed our patella and drilled 3 holes for a press-fit size 29 patellar button.  With all trial instrumentation out of the knee, we irrigated the knee with  normal saline solution using pulsatile lavage.  We then dried the knee real well and placed our real Stryker press-fit tibial tray size 3, followed by real size 4 left femur.  We placed our real 12 mm fixed bearing polyethylene insert and press-fit our  size 29 patellar button.  I then put the knee through several cycles of range of motion.  We were pleased with range of motion and stability of the knee.  We then let the tourniquet down and hemostasis was obtained with electrocautery.  We closed the  arthrotomy with interrupted #1 Vicryl suture, followed by 0 Vicryl to close deep tissue, 2-0 Vicryl to close the subcutaneous tissue and interrupted staples on the skin incision.  A Xeroform well-padded sterile dressing was applied.  She was taken to the  recovery room in stable condition.  All final counts were correct.  There were no complications noted.  Of note, Kayla Stabile, PA-C, assisted during the entire case and his assistance was crucial for facilitating all aspects of this case.  VN/NUANCE  D:05/19/2020 T:05/19/2020 JOB:[id number]

## 2020-05-19 NOTE — Telephone Encounter (Signed)
[name] with the [facility] called needing the name of the facility that the patient will be doing PT with.  She is needing to get the authorization sent over to them.  CB#[phone], if she does not answer, you can leave a vm.  Patient is having surgery today.  Thank you.

## 2020-05-19 NOTE — Plan of Care (Signed)
  Problem: Education: Goal: Knowledge of General Education information will improve Description: Including pain rating scale, medication(s)/side effects and non-pharmacologic comfort measures Outcome: Progressing   Problem: Clinical Measurements: Goal: Respiratory complications will improve Outcome: Progressing Note: On room air   Problem: Nutrition: Goal: Adequate nutrition will be maintained Outcome: Progressing   Problem: Coping: Goal: Level of anxiety will decrease Outcome: Progressing   Problem: Pain Managment: Goal: General experience of comfort will improve Outcome: Progressing Note: Treated once with oxycodone for left knee pain   Problem: Safety: Goal: Ability to remain free from injury will improve Outcome: Progressing

## 2020-05-19 NOTE — Plan of Care (Signed)

## 2020-05-19 NOTE — Anesthesia Procedure Notes (Signed)
Anesthesia Regional Block: Adductor canal block   Pre-Anesthetic Checklist: ,, timeout performed, Correct Patient, Correct Site, Correct Laterality, Correct Procedure, Correct Position, site marked, Risks and benefits discussed,  Surgical consent,  Pre-op evaluation,  At surgeon's request and post-op pain management  Laterality: Lower and Left  Prep: chloraprep       Needles:  Injection technique: Single-shot  Needle Type: Echogenic Stimulator Needle     Needle Length: 9cm  Needle Gauge: 20   Needle insertion depth: 3.5 cm   Additional Needles:   Procedures:,,,, ultrasound used (permanent image in chart),,,,  Narrative:  Start time: 05/19/2020 1:50 PM End time: 05/19/2020 2:00 PM Injection made incrementally with aspirations every 5 mL.  Performed by: Personally  Anesthesiologist: Lyn Hollingshead, MD

## 2020-05-19 NOTE — Transfer of Care (Signed)
Immediate Anesthesia Transfer of Care Note  Patient: Kayla Miles  Procedure(s) Performed: LEFT TOTAL KNEE ARTHROPLASTY (Left Knee)  Patient Location: PACU  Anesthesia Type:MAC and Spinal  Level of Consciousness: awake, alert  and oriented  Airway & Oxygen Therapy: Patient Spontanous Breathing  Post-op Assessment: Report given to RN and Post -op Vital signs reviewed and stable  Post vital signs: Reviewed and stable  Last Vitals:  Vitals Value Taken Time  BP 110/71 05/19/20 1622  Temp    Pulse 70 05/19/20 1626  Resp 12 05/19/20 1626  SpO2 99 % 05/19/20 1626  Vitals shown include unvalidated device data.  Last Pain:  Vitals:   05/19/20 1240  TempSrc:   PainSc: 0-No pain         Complications: No apparent anesthesia complications

## 2020-05-19 NOTE — H&P (Signed)
TOTAL KNEE ADMISSION H&P  Patient is being admitted for left total knee arthroplasty.  Subjective:  Chief Complaint:left knee pain.  HPI: Kayla Miles, 69 y.o. female, has a history of pain and functional disability in the left knee due to arthritis and has failed non-surgical conservative treatments for greater than 12 weeks to includeNSAID's and/or analgesics, corticosteriod injections, viscosupplementation injections, flexibility and strengthening excercises, supervised PT with diminished ADL's post treatment, use of assistive devices, weight reduction as appropriate and activity modification.  Onset of symptoms was gradual, starting 3 years ago with gradually worsening course since that time. The patient noted no past surgery on the left knee(s).  Patient currently rates pain in the left knee(s) at 10 out of 10 with activity. Patient has night pain, worsening of pain with activity and weight bearing, pain that interferes with activities of daily living, pain with passive range of motion, crepitus and joint swelling.  Patient has evidence of subchondral sclerosis, periarticular osteophytes and joint space narrowing by imaging studies. There is no active infection.  Patient Active Problem List   Diagnosis Date Noted  . Status post total right knee replacement 07/12/2018  . Primary osteoarthritis of left knee 04/11/2018  . Unilateral primary osteoarthritis, right knee 03/07/2018  . Chronic pain of right knee 03/07/2018  . Chronic pain of left knee 03/07/2018  . Malignant neoplasm of central portion of left female breast ([place]) 04/16/2014   Past Medical History:  Diagnosis Date  . Arthralgia of knee, right   . Arthritis    left knee osteoarthritis   . Breast cancer ([place]) 03/26/14   Left  . HTN (hypertension)   . Hypothyroidism   . Pre-diabetes    05/15/20 - per pt, insisted that she was not diabetic or pre diabetic. pt does not check sugars at home and has never been diagnosed     Past Surgical History:  Procedure Laterality Date  . BREAST IMPLANT EXCHANGE Bilateral 05/03/2018  . MASTECTOMY Bilateral 2015   with reconstruction.   . TONSILLECTOMY    . TOTAL KNEE ARTHROPLASTY Right 05/29/2018  . TOTAL KNEE ARTHROPLASTY Right 05/29/2018   Procedure: RIGHT  TOTAL KNEE ARTHROPLASTY;  Surgeon: [name], MD;  Location: [facility];  Service: Orthopedics;  Laterality: Right;    Current Facility-Administered Medications  Medication Dose Route Frequency Provider Last Rate Last Admin  . [START ON 05/20/2020] ceFAZolin (ANCEF) IVPB 2g/100 mL premix  2 g Intravenous On Call to OR Pete Pelt, PA-C      . fentaNYL (SUBLIMAZE) 100 MCG/2ML injection           . midazolam (VERSED) 2 MG/2ML injection           . tranexamic acid (CYKLOKAPRON) IVPB 1,000 mg  1,000 mg Intravenous To OR Pete Pelt, PA-C       Allergies  Allergen Reactions  . Contrast Media [Iodinated Diagnostic Agents] Other (See Comments)    Throat swelling prohance   . Prohance [Gadoteridol] Other (See Comments)    Throat swelling  . Codeine Nausea And Vomiting    Severe N/V    Social History   Tobacco Use  . Smoking status: Never Smoker  . Smokeless tobacco: Never Used  Substance Use Topics  . Alcohol use: No    Family History  Problem Relation Age of Onset  . Colon cancer Father 84  . Colon cancer Paternal Uncle      Review of Systems  All other systems reviewed and are  negative.   Objective:  Physical Exam  Constitutional: She is oriented to person, place, and time. She appears well-developed and well-nourished.  HENT:  Head: Normocephalic and atraumatic.  Eyes: Pupils are equal, round, and reactive to light. EOM are normal.  Cardiovascular: Normal rate.  Respiratory: Effort normal.  GI: Soft.  Musculoskeletal:     Cervical back: Normal range of motion and neck supple.     Left knee: Swelling, effusion and bony tenderness present. Decreased range of motion.  Tenderness present over the medial joint line and lateral joint line. Abnormal alignment and abnormal meniscus.  Neurological: She is alert and oriented to person, place, and time.  Skin: Skin is warm and dry.  Psychiatric: She has a normal mood and affect.    Vital signs in last 24 hours: Temp:  [98.3 F (36.8 C)] 98.3 F (36.8 C) (06/08 1231) Pulse Rate:  [78] 78 (06/08 1231) Resp:  [18] 18 (06/08 1231) BP: (129)/(72) 129/72 (06/08 1231) SpO2:  [100 %] 100 % (06/08 1231) Weight:  [86.2 kg] 86.2 kg (06/08 1231)  Labs:   Estimated body mass index is 32.61 kg/m as calculated from the following:   Height as of this encounter: 5\' 4"  (1.626 m).   Weight as of this encounter: 86.2 kg.   Imaging Review Plain radiographs demonstrate severe degenerative joint disease of the left knee(s). The overall alignment ismild varus. The bone quality appears to be good for age and reported activity level.      Assessment/Plan:  End stage arthritis, left knee   The patient history, physical examination, clinical judgment of the provider and imaging studies are consistent with end stage degenerative joint disease of the left knee(s) and total knee arthroplasty is deemed medically necessary. The treatment options including medical management, injection therapy arthroscopy and arthroplasty were discussed at length. The risks and benefits of total knee arthroplasty were presented and reviewed. The risks due to aseptic loosening, infection, stiffness, patella tracking problems, thromboembolic complications and other imponderables were discussed. The patient acknowledged the explanation, agreed to proceed with the plan and consent was signed. Patient is being admitted for inpatient treatment for surgery, pain control, PT, OT, prophylactic antibiotics, VTE prophylaxis, progressive ambulation and ADL's and discharge planning. The patient is planning to be discharged home with home health services

## 2020-05-19 NOTE — Progress Notes (Signed)
Orthopedic Tech Progress Note Patient Details:  Kayla Miles 14-Sep-1951 [id number]  CPM Left Knee CPM Left Knee: On Left Knee Flexion (Degrees): 0 Left Knee Extension (Degrees): 60  Post Interventions Patient Tolerated: Well Instructions Provided: Adjustment of device  Elexis Pollak A Jaelah Hauth 05/19/2020, 5:11 PM

## 2020-05-19 NOTE — Anesthesia Procedure Notes (Signed)
Spinal  Patient location during procedure: OR Start time: 05/19/2020 2:16 PM End time: 05/19/2020 2:19 PM Staffing Performed: anesthesiologist  Anesthesiologist: Lyn Hollingshead, MD Preanesthetic Checklist Completed: patient identified, IV checked, site marked, risks and benefits discussed, surgical consent, monitors and equipment checked, pre-op evaluation and timeout performed Spinal Block Patient position: sitting Prep: DuraPrep and site prepped and draped Patient monitoring: continuous pulse ox and blood pressure Approach: midline Location: L3-4 Injection technique: single-shot Needle Needle type: Pencan  Needle gauge: 24 G Needle length: 10 cm Needle insertion depth: 6 cm Assessment Sensory level: T8

## 2020-05-19 NOTE — Brief Op Note (Signed)
05/19/2020  3:50 PM  PATIENT:  Kayla Miles  69 y.o. female  PRE-OPERATIVE DIAGNOSIS:  osteoarthritis left knee  POST-OPERATIVE DIAGNOSIS:  osteoarthritis left knee  PROCEDURE:  Procedure(s): LEFT TOTAL KNEE ARTHROPLASTY (Left)  SURGEON:  Surgeon(s) and Role:    [name], MD - Primary  PHYSICIAN ASSISTANT: Benita Stabile, PA-C  ANESTHESIA:   regional and spinal  EBL:  150 mL   COUNTS:  YES  TOURNIQUET:   Total Tourniquet Time Documented: Thigh (Left) - 53 minutes Total: Thigh (Left) - 53 minutes   DICTATION: .Other Dictation: Dictation Number [id number]  PLAN OF CARE: Admit for overnight observation  PATIENT DISPOSITION:  PACU - hemodynamically stable.   Delay start of Pharmacological VTE agent (>24hrs) due to surgical blood loss or risk of bleeding: no

## 2020-05-19 NOTE — Telephone Encounter (Signed)
[name] aware it is [facility]

## 2020-05-20 ENCOUNTER — Encounter: Payer: Self-pay | Admitting: *Deleted

## 2020-05-20 ENCOUNTER — Telehealth: Payer: Self-pay | Admitting: Orthopaedic Surgery

## 2020-05-20 ENCOUNTER — Other Ambulatory Visit: Payer: Self-pay

## 2020-05-20 DIAGNOSIS — M170 Bilateral primary osteoarthritis of knee: Secondary | ICD-10-CM | POA: Diagnosis not present

## 2020-05-20 DIAGNOSIS — Z96652 Presence of left artificial knee joint: Secondary | ICD-10-CM

## 2020-05-20 LAB — CBC
HCT: 36.2 % (ref 36.0–46.0)
Hemoglobin: 11.3 g/dL — ABNORMAL LOW (ref 12.0–15.0)
MCH: 22.7 pg — ABNORMAL LOW (ref 26.0–34.0)
MCHC: 31.2 g/dL (ref 30.0–36.0)
MCV: 72.8 fL — ABNORMAL LOW (ref 80.0–100.0)
Platelets: 270 10*3/uL (ref 150–400)
RBC: 4.97 MIL/uL (ref 3.87–5.11)
RDW: 16.2 % — ABNORMAL HIGH (ref 11.5–15.5)
WBC: 9.5 10*3/uL (ref 4.0–10.5)
nRBC: 0 % (ref 0.0–0.2)

## 2020-05-20 LAB — BASIC METABOLIC PANEL
Anion gap: 8 (ref 5–15)
BUN: 13 mg/dL (ref 8–23)
CO2: 25 mmol/L (ref 22–32)
Calcium: 8.5 mg/dL — ABNORMAL LOW (ref 8.9–10.3)
Chloride: 106 mmol/L (ref 98–111)
Creatinine, Ser: 0.97 mg/dL (ref 0.44–1.00)
GFR calc Af Amer: >60 mL/min (ref >60)
GFR calc non Af Amer: >60 mL/min (ref >60)
Glucose, Bld: 119 mg/dL — ABNORMAL HIGH (ref 70–99)
Potassium: 4.4 mmol/L (ref 3.5–5.1)
Sodium: 139 mmol/L (ref 135–145)

## 2020-05-20 MED ORDER — ONDANSETRON 4 MG PO TBDP: 4.0000 mg | ORAL_TABLET | Freq: Three times a day (TID) | ORAL | 0 refills | Status: AC | PRN

## 2020-05-20 MED ORDER — OXYCODONE HCL 5 MG PO TABS: 5.0000 mg | ORAL_TABLET | ORAL | 0 refills | Status: DC | PRN

## 2020-05-20 MED ORDER — ASPIRIN 325 MG PO TBEC: 325.0000 mg | DELAYED_RELEASE_TABLET | Freq: Two times a day (BID) | ORAL | 0 refills | Status: AC

## 2020-05-20 MED ORDER — KETOROLAC TROMETHAMINE 15 MG/ML IJ SOLN
15.0000 mg | Freq: Four times a day (QID) | INTRAMUSCULAR | Status: AC
  Administered 2020-05-20 (×2): 15 mg via INTRAVENOUS
  Filled 2020-05-20 (×2): qty 1

## 2020-05-20 MED ORDER — METHOCARBAMOL 500 MG PO TABS: 500.0000 mg | ORAL_TABLET | Freq: Four times a day (QID) | ORAL | 1 refills | Status: AC | PRN

## 2020-05-20 NOTE — Telephone Encounter (Signed)
Patient called asked if she can get her physical at [facility]. [address]. The number to contact patient is [phone]

## 2020-05-20 NOTE — Telephone Encounter (Signed)
Sent order.

## 2020-05-20 NOTE — Discharge Instructions (Signed)

## 2020-05-20 NOTE — Progress Notes (Signed)
Subjective: 1 Day Post-Op Procedure(s) (LRB): LEFT TOTAL KNEE ARTHROPLASTY (Left) Patient reports pain as moderate.    Objective: Vital signs in last 24 hours: Temp:  [97.4 F (36.3 C)-98.5 F (36.9 C)] 98.5 F (36.9 C) (06/09 0555) Pulse Rate:  [56-86] 72 (06/09 0555) Resp:  [9-18] 16 (06/09 0555) BP: (110-154)/(57-89) 111/57 (06/09 0555) SpO2:  [97 %-100 %] 97 % (06/09 0555) Weight:  [86.2 kg] 86.2 kg (06/08 1231)  Intake/Output from previous day: 06/08 0701 - 06/09 0700 In: 2056.8 [P.O.:400; I.V.:1556.8; IV Piggyback:100] Out: 750 [Urine:600; Blood:150] Intake/Output this shift: No intake/output data recorded.  Recent Labs    05/20/20 0305  HGB 11.3*   Recent Labs    05/20/20 0305  WBC 9.5  RBC 4.97  HCT 36.2  PLT 270   Recent Labs    05/20/20 0305  NA 139  K 4.4  CL 106  CO2 25  BUN 13  CREATININE 0.97  GLUCOSE 119*  CALCIUM 8.5*   No results for input(s): LABPT, INR in the last 72 hours.  Sensation intact distally Intact pulses distally Dorsiflexion/Plantar flexion intact Incision: dressing C/D/I No cellulitis present Compartment soft   Assessment/Plan: 1 Day Post-Op Procedure(s) (LRB): LEFT TOTAL KNEE ARTHROPLASTY (Left) Up with therapy Discharge home with home health this afternoon.    Patient's anticipated LOS is less than 2 midnights, meeting these requirements: - Younger than 27 - Lives within 1 hour of care - Has a competent adult at home to recover with post-op recover - NO history of  - Chronic pain requiring opiods  - Diabetes  - Coronary Artery Disease  - Heart failure  - Heart attack  - Stroke  - DVT/VTE  - Cardiac arrhythmia  - Respiratory Failure/COPD  - Renal failure  - Anemia  - Advanced Liver disease       [name] 05/20/2020, 7:06 AM

## 2020-05-20 NOTE — Progress Notes (Signed)
Marney Doctor Ziglar to be discharged home per MD order.  Discussed prescriptions,post op care and follow up appointments with the patient. Prescriptions were sent to patient's preferred pharmacy, medication list explained in detail. Pt verbalized understanding. Daughter brought rolling walker of patient's to the room, that patient will use at home.  Allergies as of 05/20/2020      Reactions   Contrast Media [iodinated Diagnostic Agents] Other (See Comments)   Throat swelling prohance   Prohance [gadoteridol] Other (See Comments)   Throat swelling   Codeine Nausea And Vomiting   Severe N/V      Medication List    TAKE these medications   amLODipine 5 MG tablet Commonly known as: NORVASC Take 5 mg by mouth daily.   aspirin 325 MG EC tablet Take 1 tablet (325 mg total) by mouth 2 (two) times daily after a meal.   Biotin 10000 MCG Tabs Take 10,000 mcg by mouth daily.   methocarbamol 500 MG tablet Commonly known as: ROBAXIN Take 1 tablet (500 mg total) by mouth every 6 (six) hours as needed for muscle spasms.   multivitamin with minerals Tabs tablet Take 1 tablet by mouth daily.   ondansetron 4 MG disintegrating tablet Commonly known as: Zofran ODT Take 1 tablet (4 mg total) by mouth every 8 (eight) hours as needed for nausea or vomiting.   oxyCODONE 5 MG immediate release tablet Commonly known as: Oxy IR/ROXICODONE Take 1-2 tablets (5-10 mg total) by mouth every 4 (four) hours as needed for moderate pain (pain score 4-6).   Synthroid 88 MCG tablet Generic drug: levothyroxine Take 88 mcg by mouth daily before breakfast.            Durable Medical Equipment  (From admission, onward)         Start     Ordered   05/19/20 1734  DME 3 n 1  Once     05/19/20 1734   05/19/20 1734  DME Walker rolling  Once    Question Answer Comment  Walker: With 5 Inch Wheels   Patient needs a walker to treat with the following condition Status post total left knee replacement       05/19/20 1734          Vitals:   05/20/20 0818 05/20/20 1409  BP: (!) 95/54 124/72  Pulse: 71 75  Resp: 16   Temp: 99.4 F (37.4 C) 99 F (37.2 C)  SpO2: 98% 96%    IV catheter discontinued, Dressing and pressure applied. Pt denies pain at this time. No complaints noted.  An After Visit Summary was printed and given to the patient. Patient escorted via Wheelchair, and discharged home via private auto with daughter.  [place] 05/20/2020 3:54 PM

## 2020-05-20 NOTE — Discharge Summary (Signed)
Patient ID: Kayla Miles MRN: [id number] DOB/AGE: [birth year] 68 y.o.  Admit date: 05/19/2020 Discharge date: 05/20/2020  Admission Diagnoses:  Principal Problem:   Primary osteoarthritis of left knee Active Problems:   Status post total left knee replacement   Discharge Diagnoses:  Same  Past Medical History:  Diagnosis Date  . Arthralgia of knee, right   . Arthritis    left knee osteoarthritis   . Breast cancer ([place]) 03/26/14   Left  . HTN (hypertension)   . Hypothyroidism   . Pre-diabetes    05/15/20 - per pt, insisted that she was not diabetic or pre diabetic. pt does not check sugars at home and has never been diagnosed    Surgeries: Procedure(s): LEFT TOTAL KNEE ARTHROPLASTY on 05/19/2020   Consultants:   Discharged Condition: Improved  Hospital Course: Kayla Miles is an 69 y.o. female who was admitted 05/19/2020 for operative treatment ofPrimary osteoarthritis of left knee. Patient has severe unremitting pain that affects sleep, daily activities, and work/hobbies. After pre-op clearance the patient was taken to the operating room on 05/19/2020 and underwent  Procedure(s): LEFT TOTAL KNEE ARTHROPLASTY.    Patient was given perioperative antibiotics:  Anti-infectives (From admission, onward)   Start     Dose/Rate Route Frequency Ordered Stop   05/20/20 0600  ceFAZolin (ANCEF) IVPB 2g/100 mL premix     2 g 200 mL/hr over 30 Minutes Intravenous On call to O.R. 05/19/20 1225 05/19/20 1427   05/19/20 2030  ceFAZolin (ANCEF) IVPB 1 g/50 mL premix     1 g 100 mL/hr over 30 Minutes Intravenous Every 6 hours 05/19/20 1734 05/20/20 0215       Patient was given sequential compression devices, early ambulation, and chemoprophylaxis to prevent DVT.  Patient benefited maximally from hospital stay and there were no complications.    Recent vital signs:  Patient Vitals for the past 24 hrs:  BP Temp Temp src Pulse Resp SpO2 Height Weight  05/20/20 0555 (!)  111/57 98.5 F (36.9 C) Oral 72 16 97 % -- --  05/19/20 1920 118/74 98 F (36.7 C) Oral 69 16 98 % -- --  05/19/20 1750 139/89 (!) 97.4 F (36.3 C) Oral 86 17 99 % -- --  05/19/20 1730 130/82 97.7 F (36.5 C) -- (!) 56 12 99 % -- --  05/19/20 1717 137/85 -- -- (!) 56 12 98 % -- --  05/19/20 1702 (!) 142/83 97.6 F (36.4 C) -- (!) 58 16 100 % -- --  05/19/20 1652 124/84 -- -- 65 12 99 % -- --  05/19/20 1636 121/76 -- -- 64 15 99 % -- --  05/19/20 1622 110/71 (!) 97.5 F (36.4 C) -- 73 17 100 % -- --  05/19/20 1400 124/65 -- -- (!) 59 (!) 9 99 % -- --  05/19/20 1355 122/63 -- -- 61 18 99 % -- --  05/19/20 1349 (!) 154/71 -- -- 67 (!) 9 99 % -- --  05/19/20 1231 129/72 98.3 F (36.8 C) Oral 78 18 100 % 5\' 4"  (1.626 m) 86.2 kg     Recent laboratory studies:  Recent Labs    05/20/20 0305  WBC 9.5  HGB 11.3*  HCT 36.2  PLT 270  NA 139  K 4.4  CL 106  CO2 25  BUN 13  CREATININE 0.97  GLUCOSE 119*  CALCIUM 8.5*     Discharge Medications:   Allergies as of 05/20/2020  Reactions   Contrast Media [iodinated Diagnostic Agents] Other (See Comments)   Throat swelling prohance   Prohance [gadoteridol] Other (See Comments)   Throat swelling   Codeine Nausea And Vomiting   Severe N/V      Medication List    TAKE these medications   amLODipine 5 MG tablet Commonly known as: NORVASC Take 5 mg by mouth daily.   aspirin 325 MG EC tablet Take 1 tablet (325 mg total) by mouth 2 (two) times daily after a meal.   Biotin 10000 MCG Tabs Take 10,000 mcg by mouth daily.   methocarbamol 500 MG tablet Commonly known as: ROBAXIN Take 1 tablet (500 mg total) by mouth every 6 (six) hours as needed for muscle spasms.   multivitamin with minerals Tabs tablet Take 1 tablet by mouth daily.   ondansetron 4 MG disintegrating tablet Commonly known as: Zofran ODT Take 1 tablet (4 mg total) by mouth every 8 (eight) hours as needed for nausea or vomiting.   oxyCODONE 5 MG immediate  release tablet Commonly known as: Oxy IR/ROXICODONE Take 1-2 tablets (5-10 mg total) by mouth every 4 (four) hours as needed for moderate pain (pain score 4-6).   Synthroid 88 MCG tablet Generic drug: levothyroxine Take 88 mcg by mouth daily before breakfast.            Durable Medical Equipment  (From admission, onward)         Start     Ordered   05/19/20 1734  DME 3 n 1  Once     05/19/20 1734   05/19/20 1734  DME Walker rolling  Once    Question Answer Comment  Walker: With 5 Inch Wheels   Patient needs a walker to treat with the following condition Status post total left knee replacement      05/19/20 1734          Diagnostic Studies: DG Knee Left Port  Result Date: 05/19/2020 CLINICAL DATA:  Status post left knee replacement today. EXAM: PORTABLE LEFT KNEE - 1-2 VIEW COMPARISON:  Plain films left knee 03/30/2020. FINDINGS: New arthroplasty is in place. No fracture or other acute abnormality. Gas in the soft tissues and surgical staples noted. IMPRESSION: Status post left knee replacement.  No acute abnormality. Electronically Signed   By: Inge Rise M.D.   On: 05/19/2020 16:56    Disposition: Discharge disposition: 01-Home or [facility]    [name], MD Follow up in 2 week(s).   Specialty: Orthopedic Surgery Contact information: [address] [place] [address] [phone]            Signed: [name] 05/20/2020, 7:10 AM

## 2020-05-20 NOTE — Progress Notes (Signed)
Physical Therapy Treatment Patient Details Name: Kayla Miles MRN: [id number] DOB: 1951-10-14 Today's Date: 05/20/2020    History of Present Illness Pt is a 69 y.o. F with significant PMH of breast cancer, right total knee replacement, s/p left total knee replacement 05/19/2020.     PT Comments    Session focused on stair training prior to discharge home. Pt negotiated 10 steps with bilateral rails to simulate set up. Cues provided for sequencing. Continues to exhibit good pain control and ambulating hallway distances with a walker without physical assist. Demonstrates mild antalgic gait pattern. Recommend OPPT to address deficits and maximize functional independence.     Follow Up Recommendations  Outpatient PT     Equipment Recommendations  Rolling walker with 5" wheels;3in1 (PT)    Recommendations for Other Services       Precautions / Restrictions Precautions Precautions: Knee Precaution Booklet Issued: Yes (comment) Precaution Comments: Verbally reviewed positioning, provided written handout Restrictions Weight Bearing Restrictions: Yes LLE Weight Bearing: Weight bearing as tolerated    Mobility  Bed Mobility Overal bed mobility: Modified Independent             General bed mobility comments: (increased time)  Transfers Overall transfer level: Modified independent Equipment used: Rolling walker (2 wheeled)                Ambulation/Gait Ambulation/Gait assistance: Modified independent (Device/Increase time) Gait Distance (Feet): 150 Feet Assistive device: Rolling walker (2 wheeled) Gait Pattern/deviations: Step-through pattern;Decreased stride length;Decreased weight shift to left;Decreased dorsiflexion - left;Antalgic     General Gait Details: Step through pattern, mildly antalgic gait pattern   Stairs Stairs: Yes Stairs assistance: Modified independent (Device/Increase time) Stair Management: Two rails Number of Stairs: 10 General  stair comments: Cues for sequencing   Wheelchair Mobility    Modified Rankin (Stroke Patients Only)       Balance Overall balance assessment: Mild deficits observed, not formally tested                                          Cognition Arousal/Alertness: Awake/alert Behavior During Therapy: WFL for tasks assessed/performed Overall Cognitive Status: Within Functional Limits for tasks assessed                                        Exercises      General Comments        Pertinent Vitals/Pain Pain Assessment: Faces Faces Pain Scale: Hurts a little bit Pain Location: L knee Pain Descriptors / Indicators: Grimacing;Guarding Pain Intervention(s): Monitored during session    Home Living                      Prior Function            PT Goals (current goals can now be found in the care plan section) Acute Rehab PT Goals Patient Stated Goal: "walk without a walker." PT Goal Formulation: With patient Time For Goal Achievement: 06/03/20 Potential to Achieve Goals: Good    Frequency    7X/week      PT Plan      Co-evaluation              AM-PAC PT "6 Clicks" Mobility   Outcome Measure  Help needed turning from your  back to your side while in a flat bed without using bedrails?: None Help needed moving from lying on your back to sitting on the side of a flat bed without using bedrails?: None Help needed moving to and from a bed to a chair (including a wheelchair)?: None Help needed standing up from a chair using your arms (e.g., wheelchair or bedside chair)?: None Help needed to walk in hospital room?: None Help needed climbing 3-5 steps with a railing? : A Little 6 Click Score: 23    End of Session   Activity Tolerance: Patient tolerated treatment well Patient left: in chair;with call bell/phone within reach Nurse Communication: Mobility status PT Visit Diagnosis: Pain;Difficulty in walking, not elsewhere  classified (R26.2) Pain - Right/Left: Left Pain - part of body: Knee     Time: 0813-8871 PT Time Calculation (min) (ACUTE ONLY): 22 min  Charges:  $Gait Training: 8-22 mins                       Wyona Almas, PT, DPT Acute Rehabilitation Services Pager [phone] Office [phone]    Deno Etienne 05/20/2020, 4:44 PM

## 2020-05-20 NOTE — Evaluation (Addendum)
Physical Therapy Evaluation Patient Details Name: Kayla Miles MRN: [id number] DOB: [birth year] Today's Date: 05/20/2020   History of Present Illness  Pt is a 69 y.o. F with significant PMH of breast cancer, right total knee replacement, s/p left total knee replacement 05/19/2020.   Clinical Impression  Pt presents s/p left TKA. On PT evaluation, pt very pleasant and motivated to participate in therapy evaluation. Overall, she is moving well. Focused on initiation of therapeutic exercises for left knee ROM/strengthening and gait training. Pt ambulating 250 feet with a walker without physical assist. Able to progress quickly to a step through pattern, moderate reliance through arms on walker. Achieving 95 degrees seated knee flexion. Education provided regarding elevating, knee positioning, activity recommendations/progression, cryotherapy, appropriate DME. Will perform stair training prior to discharge home.     Follow Up Recommendations Outpatient PT    Equipment Recommendations  Rolling walker with 5" wheels;3in1 (PT)    Recommendations for Other Services       Precautions / Restrictions Precautions Precautions: Knee Precaution Booklet Issued: Yes (comment) Precaution Comments: Verbally reviewed positioning, provided written handout Restrictions Weight Bearing Restrictions: Yes LLE Weight Bearing: Weight bearing as tolerated      Mobility  Bed Mobility Overal bed mobility: Modified Independent                Transfers Overall transfer level: Modified independent Equipment used: Rolling walker (2 wheeled)                Ambulation/Gait Ambulation/Gait assistance: Modified independent (Device/Increase time) Gait Distance (Feet): 250 Feet Assistive device: Rolling walker (2 wheeled) Gait Pattern/deviations: Step-through pattern;Step-to pattern;Decreased stride length;Decreased weight shift to left;Decreased dorsiflexion - left     General Gait Details:  Initially performing step to pattern with cues for sequencing, progressing quickly to step through pattern. Cues for neutral left foot positioning. Moderate reliance through arms on walker   Stairs            Wheelchair Mobility    Modified Rankin (Stroke Patients Only)       Balance Overall balance assessment: Mild deficits observed, not formally tested                                           Pertinent Vitals/Pain Pain Assessment: Faces Faces Pain Scale: Hurts a little bit Pain Location: L knee Pain Descriptors / Indicators: Grimacing;Guarding Pain Intervention(s): Monitored during session    Home Living Family/patient expects to be discharged to:: Private residence Living Arrangements: Alone Available Help at Discharge: Family;Available PRN/intermittently(son, daughter) Type of Home: House Home Access: Stairs to enter Entrance Stairs-Rails: Can reach both Entrance Stairs-Number of Steps: 14 Home Layout: One level Home Equipment: None      Prior Function Level of Independence: Independent         Comments: Geophysicist/field seismologist for [employer], prior Development worker, international aid [employer]        Extremity/Trunk Assessment   Upper Extremity Assessment Upper Extremity Assessment: Overall WFL for tasks assessed    Lower Extremity Assessment Lower Extremity Assessment: LLE deficits/detail LLE Deficits / Details: s/p TKA. Able to perform SLR    Cervical / Trunk Assessment Cervical / Trunk Assessment: Normal  Communication   Communication: No difficulties  Cognition Arousal/Alertness: Awake/alert Behavior During Therapy: WFL for tasks assessed/performed Overall Cognitive Status: Within Functional Limits for tasks assessed  General Comments      Exercises Total Joint Exercises Quad Sets: Left;10 reps;Supine Heel Slides: Left;5 reps;Seated Hip ABduction/ADduction: Left;10  reps;Supine Straight Leg Raises: Left;5 reps;Supine Long Arc Quad: Left;10 reps;Supine   Assessment/Plan    PT Assessment Patient needs continued PT services  PT Problem List Decreased strength;Decreased range of motion;Decreased mobility;Pain       PT Treatment Interventions DME instruction;Gait training;Stair training;Functional mobility training;Therapeutic activities;Therapeutic exercise;Balance training;Patient/family education    PT Goals (Current goals can be found in the Care Plan section)  Acute Rehab PT Goals Patient Stated Goal: "walk without a walker." PT Goal Formulation: With patient Time For Goal Achievement: 06/03/20 Potential to Achieve Goals: Good    Frequency 7X/week   Barriers to discharge        Co-evaluation               AM-PAC PT "6 Clicks" Mobility  Outcome Measure Help needed turning from your back to your side while in a flat bed without using bedrails?: None Help needed moving from lying on your back to sitting on the side of a flat bed without using bedrails?: None Help needed moving to and from a bed to a chair (including a wheelchair)?: None Help needed standing up from a chair using your arms (e.g., wheelchair or bedside chair)?: None Help needed to walk in hospital room?: None Help needed climbing 3-5 steps with a railing? : A Little 6 Click Score: 23    End of Session   Activity Tolerance: Patient tolerated treatment well Patient left: in chair;with call bell/phone within reach Nurse Communication: Mobility status PT Visit Diagnosis: Pain;Difficulty in walking, not elsewhere classified (R26.2) Pain - Right/Left: Left Pain - part of body: Knee    Time: 0828-0908 PT Time Calculation (min) (ACUTE ONLY): 40 min   Charges:   PT Evaluation $PT Eval Low Complexity: 1 Low PT Treatments $Gait Training: 8-22 mins $Therapeutic Exercise: 8-22 mins          Wyona Almas, PT, DPT Acute Rehabilitation Services Pager  [phone] Office [phone]   Deno Etienne 05/20/2020, 10:23 AM

## 2020-05-22 NOTE — Anesthesia Postprocedure Evaluation (Signed)
Anesthesia Post Note  Patient: Kayla Miles  Procedure(s) Performed: LEFT TOTAL KNEE ARTHROPLASTY (Left Knee)     Patient location during evaluation: PACU Anesthesia Type: Spinal Level of consciousness: sedated Pain management: pain level controlled Respiratory status: spontaneous breathing Cardiovascular status: stable Postop Assessment: no headache, no backache, spinal receding, patient able to bend at knees and no apparent nausea or vomiting Anesthetic complications: no   No complications documented.  Last Vitals:  Vitals:   05/20/20 0818 05/20/20 1409  BP: (!) 95/54 124/72  Pulse: 71 75  Resp: 16   Temp: 37.4 C 37.2 C  SpO2: 98% 96%    Last Pain:  Vitals:   05/20/20 1530  TempSrc:   PainSc: 0-No pain                 Huston Foley

## 2020-05-25 ENCOUNTER — Other Ambulatory Visit: Payer: Self-pay

## 2020-05-25 ENCOUNTER — Encounter: Payer: Self-pay | Admitting: Physical Therapy

## 2020-05-25 ENCOUNTER — Ambulatory Visit: Payer: No Typology Code available for payment source | Attending: Orthopaedic Surgery | Admitting: Physical Therapy

## 2020-05-25 DIAGNOSIS — R2689 Other abnormalities of gait and mobility: Secondary | ICD-10-CM | POA: Diagnosis present

## 2020-05-25 DIAGNOSIS — M6281 Muscle weakness (generalized): Secondary | ICD-10-CM | POA: Diagnosis present

## 2020-05-25 DIAGNOSIS — R600 Localized edema: Secondary | ICD-10-CM | POA: Insufficient documentation

## 2020-05-25 DIAGNOSIS — M25562 Pain in left knee: Secondary | ICD-10-CM | POA: Diagnosis not present

## 2020-05-25 DIAGNOSIS — G8929 Other chronic pain: Secondary | ICD-10-CM | POA: Diagnosis present

## 2020-05-25 NOTE — Therapy (Signed)
[facility] [place], [place], [address] Phone: [phone]   Fax:  [phone]  Physical Therapy Evaluation  Patient Details  Name: Kayla Miles MRN: [id number] Date of Birth: [birth year] Referring Provider (PT): Jean Rosenthal MD   Encounter Date: 05/25/2020   PT End of Session - 05/25/20 1020    Visit Number 1    Number of Visits 15    Date for PT Re-Evaluation 07/20/20    Authorization Type VA    Authorization - Visit Number 1    Authorization - Number of Visits 15    Progress Note Due on Visit 10    PT Start Time 1017    PT Stop Time 1100    PT Time Calculation (min) 43 min    Activity Tolerance Patient tolerated treatment well    Behavior During Therapy [facility] for tasks assessed/performed           Past Medical History:  Diagnosis Date  . Arthralgia of knee, right   . Arthritis    left knee osteoarthritis   . Breast cancer ([place]) 03/26/14   Left  . HTN (hypertension)   . Hypothyroidism   . Pre-diabetes    05/15/20 - per pt, insisted that she was not diabetic or pre diabetic. pt does not check sugars at home and has never been diagnosed    Past Surgical History:  Procedure Laterality Date  . BREAST IMPLANT EXCHANGE Bilateral 05/03/2018  . MASTECTOMY Bilateral 2015   with reconstruction.   . TONSILLECTOMY    . TOTAL KNEE ARTHROPLASTY Right 05/29/2018  . TOTAL KNEE ARTHROPLASTY Right 05/29/2018   Procedure: RIGHT  TOTAL KNEE ARTHROPLASTY;  Surgeon: [name], MD;  Location: [facility];  Service: Orthopedics;  Laterality: Right;  . TOTAL KNEE ARTHROPLASTY Left 05/19/2020   Procedure: LEFT TOTAL KNEE ARTHROPLASTY;  Surgeon: [name], MD;  Location: [facility];  Service: Orthopedics;  Laterality: Left;    There were no vitals filed for this visit.    Subjective Assessment - 05/25/20 1022    Subjective pt is a 69 y.o F s/p L TKA on 05/19/2020. she reports having pain  still but notes it is normal since she had surgery 2 weeks. pain stays inthe knee and denies any referred symptoms.    How long can you sit comfortably? few hours    How long can you stand comfortably? 1 hour with RW    How long can you walk comfortably? 1 hour with RW    Patient Stated Goals strengthening the knee, move on with life.    Currently in Pain? Yes    Pain Score 6    at worst 10/10,   Pain Location Knee    Pain Orientation Left    Pain Descriptors / Indicators Aching;Tightness    Pain Type Surgical pain    Pain Onset In the past 7 days    Pain Frequency Intermittent    Aggravating Factors  prolonged standing/ walking, bending the knee, turning over at night    Pain Relieving Factors stretching and resting,    Effect of Pain on Daily Activities limited standing/ walking              [facility] PT Assessment - 05/25/20 0001      Assessment   Medical Diagnosis L TKA    Referring Provider (PT) Jean Rosenthal MD    Onset Date/Surgical Date 05/19/20    Hand Dominance Right  Next MD Visit 06/02/2020    Prior Therapy yes      Precautions   Precautions None      Restrictions   Weight Bearing Restrictions No      Balance Screen   Has the patient fallen in the past 6 months No      [place] residence    Living Arrangements Alone    Type of [place] to enter    Entrance Stairs-Number of Steps [place] Can reach both    [place] One level    [place] - 2 wheels      Prior Function   Level of Independence Independent with basic ADLs    Vocation Full time employment   [place] transportation   [employer] prolonged sitting      Cognition   Overall Cognitive Status Within Functional Limits for tasks assessed      Observation/Other Assessments   Focus on Therapeutic Outcomes (FOTO)  51% limited   predicted 30% limited      Posture/Postural Control   Posture/Postural Control Postural limitations      ROM / Strength   AROM / PROM / Strength AROM;PROM;Strength      AROM   AROM Assessment Site Knee    Right/Left Knee Right;Left    Right Knee Extension 5    Right Knee Flexion 112    Left Knee Extension 67    Left Knee Flexion -22      PROM   PROM Assessment Site Knee    Right/Left Knee Left    Left Knee Extension 76    Left Knee Flexion -20      Strength   Strength Assessment Site Hip;Knee    Right/Left Hip Left;Right    Right Hip ABduction 4/5    Right Hip ADduction 4-/5    Left Hip ABduction 3+/5    Left Hip ADduction 4-/5    Right/Left Knee Right;Left    Right Knee Flexion 5/5    Right Knee Extension 5/5    Left Knee Flexion 3-/5    Left Knee Extension 3-/5      Palpation   Palpation comment pei-patellar and along med/lateral joint line      Ambulation/Gait   Ambulation/Gait Yes    Assistive device Rolling walker    Gait Pattern Decreased stride length;Antalgic                      Objective measurements completed on examination: See above findings.       [facility] Adult PT Treatment/Exercise - 05/25/20 0001      Exercises   Exercises Knee/Hip      Modalities   Modalities Vasopneumatic      Vasopneumatic   Number Minutes Vasopneumatic  10 minutes    Vasopnuematic Location  Knee    Vasopneumatic Pressure Medium    Vasopneumatic Temperature  34                  PT Education - 05/25/20 1121    Education Details evaluation findings, POC, goals, HEP with proper form/ rationale.    Person(s) Educated Patient    Methods Explanation;Verbal cues;Handout    Comprehension Verbalized understanding;Verbal cues required            PT Short Term Goals - 05/25/20 1208      PT SHORT TERM  GOAL #1   Title pt to be I with inital HEP    Baseline -    Time 3    Period Weeks    Status New    Target Date 06/15/20      PT SHORT TERM GOAL #2   Title pt to  increase L knee AROM to >/= 10 - 90 degrees for therapuetic progression    Time 3    Period Weeks    Status New    Target Date 06/15/20      PT SHORT TERM GOAL #3   Title -      PT SHORT TERM GOAL #4   Title -    Baseline -             PT Long Term Goals - 05/25/20 1209      PT LONG TERM GOAL #1   Title Patient to demonstrate functional muscle strength as being MMT 5/5 in order to improve mechanics and reduce pain     Time 8    Period Weeks    Status New    Target Date 07/20/20      PT LONG TERM GOAL #2   Title Patient to be able to reciprocally ascend/descend full flight of stairs with U railing, reciprocal pattern, good eccenric control in order to improve home/community access     Baseline -    Time 8    Period Weeks    Status New      PT LONG TERM GOAL #3   Title pt to be able to walk/ stand >/= 60 min with LRAD for community ambulation and assist with work related tasks.    Baseline -    Time 8    Period Weeks    Status New    Target Date 07/20/20      PT LONG TERM GOAL #4   Title increase FOTO score to </= 30% limited to demo improvement in function    Time 8    Period Weeks    Status New    Target Date 07/20/20      PT LONG TERM GOAL #5   Title pt to be I with all HEP given to maintain and progress current level of function    Time 8    Period Weeks    Status New    Target Date 07/20/20                  Plan - 05/25/20 1057    Clinical Impression Statement pt present to OPPT s/p L TKA on 05/19/2020. She demonstrates limited knee ROM 22 - 67 secondary to pain and localized edema, with associated weakness in the LLE. She currently amb using RW exhibiting and antalgic pattern with limited stride. She would benefit from physical therapy to decrease L knee pain, improve ROM, increase gait efficiency and maximize function by addressing the deficits listed.    Personal Factors and Comorbidities Comorbidity 2    Comorbidities hx of Cx, pre-diabetes     Examination-Activity Limitations Stand;Stairs;Squat;Locomotion Level    Stability/Clinical Decision Making Evolving/Moderate complexity    Clinical Decision Making Moderate    Rehab Potential Good    PT Frequency 2x / week    PT Duration 8 weeks    PT Treatment/Interventions ADLs/Self Care Home Management;Cryotherapy;Electrical Stimulation;Iontophoresis 4mg /ml Dexamethasone;Moist Heat;Ultrasound;Gait training;Therapeutic exercise;Therapeutic activities;Neuromuscular re-education;Balance training;Passive range of motion;Taping;Vasopneumatic Device;Patient/family education    PT Next Visit Plan review / update HEP PRN, provide pt FOTO handout,  knee mobs/ AAROM, hip / knee strengthening, gait training, vaso for pain/ swelling    PT Home Exercise Plan [id number] - seated and supine hamstring stretch, supine and seated heel slide, quad set with towel under knee, sidelying hip abduction,    Consulted and Agree with Plan of Care Patient           Patient will benefit from skilled therapeutic intervention in order to improve the following deficits and impairments:  Improper body mechanics, Decreased strength, Increased muscle spasms, Abnormal gait, Postural dysfunction, Decreased range of motion, Decreased activity tolerance, Decreased balance, Decreased endurance, Pain, Increased edema  Visit Diagnosis: Chronic pain of left knee  Localized edema  Muscle weakness (generalized)  Other abnormalities of gait and mobility     Problem List Patient Active Problem List   Diagnosis Date Noted  . 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 Status post total right knee replacement 07/12/2018  . Primary osteoarthritis of left knee 04/11/2018  . Unilateral primary osteoarthritis, right knee 03/07/2018  . Chronic pain of right knee 03/07/2018  . Chronic pain of left knee 03/07/2018  . Malignant neoplasm of central portion of left female breast ([place]) 04/16/2014   Starr [place] PT,  DPT, LAT, ATC  05/25/20  12:15 PM      [facility] [address] [place], [place], [address] Phone: [phone]   Fax:  [phone]  Name: Chasiti Waddington MRN: [id number] Date of Birth: January 10, 1951

## 2020-06-02 ENCOUNTER — Ambulatory Visit (INDEPENDENT_AMBULATORY_CARE_PROVIDER_SITE_OTHER): Payer: No Typology Code available for payment source | Admitting: Orthopaedic Surgery

## 2020-06-02 ENCOUNTER — Encounter: Payer: Self-pay | Admitting: Orthopaedic Surgery

## 2020-06-02 DIAGNOSIS — Z96652 Presence of left artificial knee joint: Secondary | ICD-10-CM

## 2020-06-02 MED ORDER — OXYCODONE HCL 5 MG PO TABS: 5.0000 mg | ORAL_TABLET | ORAL | 0 refills | Status: AC | PRN

## 2020-06-02 NOTE — Progress Notes (Signed)
The patient is 2 weeks today status post a left total knee arthroplasty.  She is doing well postoperative.  On examination her staples are intact and I remove the staples in place Steri-Strips.  Her range of motion shows that she lacks full extension by about 3 degrees and I can flex her to about 90 degrees.  Her calf is soft.  She understands the importance of physical therapy.  She has had a right total knee arthroplasty that was done successfully.  She will continue increase her activities as comfort allows.  I will send in some more oxycodone for her.  We will see her back in 4 weeks to see how she is doing overall.

## 2020-06-04 ENCOUNTER — Ambulatory Visit: Payer: No Typology Code available for payment source | Admitting: Physical Therapy

## 2020-06-04 ENCOUNTER — Other Ambulatory Visit: Payer: Self-pay

## 2020-06-04 DIAGNOSIS — M6281 Muscle weakness (generalized): Secondary | ICD-10-CM

## 2020-06-04 DIAGNOSIS — M25562 Pain in left knee: Secondary | ICD-10-CM

## 2020-06-04 DIAGNOSIS — R2689 Other abnormalities of gait and mobility: Secondary | ICD-10-CM

## 2020-06-04 DIAGNOSIS — G8929 Other chronic pain: Secondary | ICD-10-CM

## 2020-06-04 DIAGNOSIS — R600 Localized edema: Secondary | ICD-10-CM

## 2020-06-04 NOTE — Therapy (Signed)
[place], [place], [address] Phone: [phone]   Fax:  [phone]  Physical Therapy Treatment  Patient Details  Name: Kayla Miles MRN: [id number] Date of Birth: 20-Feb-1951 Referring Provider (PT): Jean Rosenthal MD   Encounter Date: 06/04/2020   PT End of Session - 06/04/20 1350    Visit Number 2    Number of Visits 15    Date for PT Re-Evaluation 07/20/20    Authorization Type VA    Authorization - Visit Number 2    Authorization - Number of Visits 15    Progress Note Due on Visit 10    PT Start Time 3220    PT Stop Time 1400    PT Time Calculation (min) 45 min           Past Medical History:  Diagnosis Date  . Arthralgia of knee, right   . Arthritis    left knee osteoarthritis   . Breast cancer ([place]) 03/26/14   Left  . HTN (hypertension)   . Hypothyroidism   . Pre-diabetes    05/15/20 - per pt, insisted that she was not diabetic or pre diabetic. pt does not check sugars at home and has never been diagnosed    Past Surgical History:  Procedure Laterality Date  . BREAST IMPLANT EXCHANGE Bilateral 05/03/2018  . MASTECTOMY Bilateral 2015   with reconstruction.   . TONSILLECTOMY    . TOTAL KNEE ARTHROPLASTY Right 05/29/2018  . TOTAL KNEE ARTHROPLASTY Right 05/29/2018   Procedure: RIGHT  TOTAL KNEE ARTHROPLASTY;  Surgeon: [name], MD;  Location: [facility];  Service: Orthopedics;  Laterality: Right;  . TOTAL KNEE ARTHROPLASTY Left 05/19/2020   Procedure: LEFT TOTAL KNEE ARTHROPLASTY;  Surgeon: [name], MD;  Location: [facility];  Service: Orthopedics;  Laterality: Left;    There were no vitals filed for this visit.   Subjective Assessment - 06/04/20 1321    Subjective I can walk without a RW and I do not have a cane.    Currently in Pain? Yes    Pain Score 7     Pain Location Knee    Pain Orientation Left    Pain Descriptors / Indicators  Aching;Tightness    Pain Type Surgical pain    Aggravating Factors  shopping, errands    Pain Relieving Factors ice, rest, meds              [facility] PT Assessment - 06/04/20 0001      AROM   Left Knee Extension 70    Left Knee Flexion -20   improved with therex                         [facility] Adult PT Treatment/Exercise - 06/04/20 0001      Ambulation/Gait   Ambulation/Gait Yes    Assistive device None   used SPC in clinic    Gait Pattern Decreased stride length;Antalgic    Gait Comments instrcuted in use of SPC in RUE- improved gait mechanics and decreased pain       Knee/Hip Exercises: Stretches   Active Hamstring Stretch Limitations seated and supine with strap for DF stretch      Knee/Hip Exercises: Seated   Long Arc Quad 10 reps    Other Seated Knee/Hip Exercises over pressure into flexion      Knee/Hip Exercises: Supine   Quad Sets 20 reps   verbal, tactile  and visual feedback   Quad Sets Limitations seated, , supine with towel     Heel Slides 10 reps    Heel Slides Limitations with and without strap       Vasopneumatic   Number Minutes Vasopneumatic  10 minutes    Vasopnuematic Location  Knee    Vasopneumatic Pressure Medium    Vasopneumatic Temperature  34                    PT Short Term Goals - 05/25/20 1208      PT SHORT TERM GOAL #1   Title pt to be I with inital HEP    Baseline -    Time 3    Period Weeks    Status New    Target Date 06/15/20      PT SHORT TERM GOAL #2   Title pt to increase L knee AROM to >/= 10 - 90 degrees for therapuetic progression    Time 3    Period Weeks    Status New    Target Date 06/15/20      PT SHORT TERM GOAL #3   Title -      PT SHORT TERM GOAL #4   Title -    Baseline -             PT Long Term Goals - 05/25/20 1209      PT LONG TERM GOAL #1   Title Patient to demonstrate functional muscle strength as being MMT 5/5 in order to improve mechanics and reduce pain     Time 8     Period Weeks    Status New    Target Date 07/20/20      PT LONG TERM GOAL #2   Title Patient to be able to reciprocally ascend/descend full flight of stairs with U railing, reciprocal pattern, good eccenric control in order to improve home/community access     Baseline -    Time 8    Period Weeks    Status New      PT LONG TERM GOAL #3   Title pt to be able to walk/ stand >/= 60 min with LRAD for community ambulation and assist with work related tasks.    Baseline -    Time 8    Period Weeks    Status New    Target Date 07/20/20      PT LONG TERM GOAL #4   Title increase FOTO score to </= 30% limited to demo improvement in function    Time 8    Period Weeks    Status New    Target Date 07/20/20      PT LONG TERM GOAL #5   Title pt to be I with all HEP given to maintain and progress current level of function    Time 8    Period Weeks    Status New    Target Date 07/20/20                 Plan - 06/04/20 1351    Clinical Impression Statement Pt arrives with no AD and significnat limp. Instructed pt in gait with SPC, she was able to decrease antalgic pattern significantly. She plans to go to medical supply to get large ice pack and SPC. Reviewed HEP. Tactile cues to activate quad in seated, supine and long sitting. Able to active quad with visual feedback.    PT Next Visit Plan review / update  HEP PRN, provide pt FOTO handout, knee mobs/ AAROM, hip / knee strengthening, gait training, vaso for pain/ swelling    PT Home Exercise Plan [id number] - seated and supine hamstring stretch, supine and seated heel slide, quad set with towel under knee, sidelying hip abduction,           Patient will benefit from skilled therapeutic intervention in order to improve the following deficits and impairments:  Improper body mechanics, Decreased strength, Increased muscle spasms, Abnormal gait, Postural dysfunction, Decreased range of motion, Decreased activity tolerance, Decreased  balance, Decreased endurance, Pain, Increased edema  Visit Diagnosis: Chronic pain of left knee  Localized edema  Muscle weakness (generalized)  Other abnormalities of gait and mobility     Problem List Patient Active Problem List   Diagnosis Date Noted  . 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 Status post total right knee replacement 07/12/2018  . Primary osteoarthritis of left knee 04/11/2018  . Unilateral primary osteoarthritis, right knee 03/07/2018  . Chronic pain of right knee 03/07/2018  . Chronic pain of left knee 03/07/2018  . Malignant neoplasm of central portion of left female breast ([place]) 04/16/2014    Dorene Ar, PTA 06/04/2020, 2:18 PM  [place], [place], [address] Phone: [phone]   Fax:  [phone]  Name: Margaux Engen MRN: [id number] Date of Birth: Feb 15, 1951

## 2020-06-09 ENCOUNTER — Encounter: Payer: Self-pay | Admitting: Physical Therapy

## 2020-06-09 ENCOUNTER — Other Ambulatory Visit: Payer: Self-pay

## 2020-06-09 ENCOUNTER — Ambulatory Visit: Payer: No Typology Code available for payment source | Admitting: Physical Therapy

## 2020-06-09 DIAGNOSIS — G8929 Other chronic pain: Secondary | ICD-10-CM

## 2020-06-09 DIAGNOSIS — M25562 Pain in left knee: Secondary | ICD-10-CM

## 2020-06-09 DIAGNOSIS — M6281 Muscle weakness (generalized): Secondary | ICD-10-CM

## 2020-06-09 DIAGNOSIS — R600 Localized edema: Secondary | ICD-10-CM

## 2020-06-09 DIAGNOSIS — R2689 Other abnormalities of gait and mobility: Secondary | ICD-10-CM

## 2020-06-09 NOTE — Therapy (Signed)
[facility] [place], [place], [address] Phone: [phone]   Fax:  [phone]  Physical Therapy Treatment  Patient Details  Name: Kayla Miles MRN: [id number] Date of Birth: [birth year] Referring Provider (PT): Jean Rosenthal MD   Encounter Date: 06/09/2020   PT End of Session - 06/09/20 1118    Visit Number 3    Number of Visits 15    Date for PT Re-Evaluation 07/20/20    Authorization Type [facility]    Authorization - Visit Number 3    Authorization - Number of Visits 15    Progress Note Due on Visit 10    PT Start Time 1110   10 minutes late   PT Stop Time 1155    PT Time Calculation (min) 45 min           Past Medical History:  Diagnosis Date  . Arthralgia of knee, right   . Arthritis    left knee osteoarthritis   . Breast cancer ([place]) 03/26/14   Left  . HTN (hypertension)   . Hypothyroidism   . Pre-diabetes    05/15/20 - per pt, insisted that she was not diabetic or pre diabetic. pt does not check sugars at home and has never been diagnosed    Past Surgical History:  Procedure Laterality Date  . BREAST IMPLANT EXCHANGE Bilateral 05/03/2018  . MASTECTOMY Bilateral 2015   with reconstruction.   . TONSILLECTOMY    . TOTAL KNEE ARTHROPLASTY Right 05/29/2018  . TOTAL KNEE ARTHROPLASTY Right 05/29/2018   Procedure: RIGHT  TOTAL KNEE ARTHROPLASTY;  Surgeon: [name], MD;  Location: [facility];  Service: Orthopedics;  Laterality: Right;  . TOTAL KNEE ARTHROPLASTY Left 05/19/2020   Procedure: LEFT TOTAL KNEE ARTHROPLASTY;  Surgeon: [name], MD;  Location: [facility];  Service: Orthopedics;  Laterality: Left;    There were no vitals filed for this visit.   Subjective Assessment - 06/09/20 1116    Subjective I have a cane now. Pain is 3/10. It was hurting last night. I stopped taking the pain meds 2 days ago due to constipation.    Pain Score 3     Pain Location Knee     Pain Orientation Left    Pain Descriptors / Indicators Aching;Tightness    Pain Type Surgical pain    Aggravating Factors  shopping, activity on feet    Pain Relieving Factors ice, rest, SPC              [facility] PT Assessment - 06/09/20 0001      AROM   Left Knee Flexion 84                         [facility] Adult PT Treatment/Exercise - 06/09/20 0001      Ambulation/Gait   Assistive device Straight cane    Gait Comments cues for heel strike       Knee/Hip Exercises: Stretches   Active Hamstring Stretch Limitations seated      Knee: Self-Stretch Limitations scoot stretch and over pressure stretch       Knee/Hip Exercises: Aerobic   Nustep L2 LE only x 6 minutes       Knee/Hip Exercises: Standing   Other Standing Knee Exercises TKE ball into wall x 10       Knee/Hip Exercises: Seated   Long Arc Quad 10 reps    Other Seated Knee/Hip Exercises seated quad set,  heel on floor, then SLR       Knee/Hip Exercises: Supine   Quad Sets 20 reps   verbal, tactile and visual feedback   Quad Sets Limitations seated, supine with towel     Heel Slides 10 reps    Heel Slides Limitations with and without strap       Vasopneumatic   Number Minutes Vasopneumatic  10 minutes    Vasopnuematic Location  Knee    Vasopneumatic Pressure Medium    Vasopneumatic Temperature  34                    PT Short Term Goals - 05/25/20 1208      PT SHORT TERM GOAL #1   Title pt to be I with inital HEP    Baseline -    Time 3    Period Weeks    Status New    Target Date 06/15/20      PT SHORT TERM GOAL #2   Title pt to increase L knee AROM to >/= 10 - 90 degrees for therapuetic progression    Time 3    Period Weeks    Status New    Target Date 06/15/20      PT SHORT TERM GOAL #3   Title -      PT SHORT TERM GOAL #4   Title -    Baseline -             PT Long Term Goals - 05/25/20 1209      PT LONG TERM GOAL #1   Title Patient to demonstrate functional  muscle strength as being MMT 5/5 in order to improve mechanics and reduce pain     Time 8    Period Weeks    Status New    Target Date 07/20/20      PT LONG TERM GOAL #2   Title Patient to be able to reciprocally ascend/descend full flight of stairs with U railing, reciprocal pattern, good eccenric control in order to improve home/community access     Baseline -    Time 8    Period Weeks    Status New      PT LONG TERM GOAL #3   Title pt to be able to walk/ stand >/= 60 min with LRAD for community ambulation and assist with work related tasks.    Baseline -    Time 8    Period Weeks    Status New    Target Date 07/20/20      PT LONG TERM GOAL #4   Title increase FOTO score to </= 30% limited to demo improvement in function    Time 8    Period Weeks    Status New    Target Date 07/20/20      PT LONG TERM GOAL #5   Title pt to be I with all HEP given to maintain and progress current level of function    Time 8    Period Weeks    Status New    Target Date 07/20/20                 Plan - 06/09/20 1148    Clinical Impression Statement Pt arrives with [facility] and improved gait mechanics. She required cues for heel strike and full knee extension with gait. Worked on termainl knee extension in various positions. Knee flexion ROM improved to 84 today. She continues to lack for knee extension AROM.  FOTO handout reviewed with patient.    Examination-Activity Limitations Stand;Stairs;Squat;Locomotion Level    PT Next Visit Plan review / update HEP PRN, capture FOTO status at visit 6, knee mobs/ AAROM, hip / knee strengthening, gait training, vaso for pain/ swelling    PT Home Exercise Plan [id number] - seated and supine hamstring stretch, supine and seated heel slide, quad set with towel under knee, sidelying hip abduction,           Patient will benefit from skilled therapeutic intervention in order to improve the following deficits and impairments:  Improper body mechanics,  Decreased strength, Increased muscle spasms, Abnormal gait, Postural dysfunction, Decreased range of motion, Decreased activity tolerance, Decreased balance, Decreased endurance, Pain, Increased edema  Visit Diagnosis: Chronic pain of left knee  Localized edema  Muscle weakness (generalized)  Other abnormalities of gait and mobility     Problem List Patient Active Problem List   Diagnosis Date Noted  . 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 Status post total right knee replacement 07/12/2018  . Primary osteoarthritis of left knee 04/11/2018  . Unilateral primary osteoarthritis, right knee 03/07/2018  . Chronic pain of right knee 03/07/2018  . Chronic pain of left knee 03/07/2018  . Malignant neoplasm of central portion of left female breast [facility]) 04/16/2014    Dorene Ar, PTA 06/09/2020, 11:54 AM  [place], [place], [address] Phone: [phone]   Fax:  [phone]  Name: Kayla Miles MRN: [id number] Date of Birth: Mar 23, 1951

## 2020-06-11 ENCOUNTER — Encounter: Payer: Self-pay | Admitting: Physical Therapy

## 2020-06-11 ENCOUNTER — Ambulatory Visit: Payer: No Typology Code available for payment source | Attending: Orthopaedic Surgery | Admitting: Physical Therapy

## 2020-06-11 ENCOUNTER — Other Ambulatory Visit: Payer: Self-pay

## 2020-06-11 DIAGNOSIS — M25562 Pain in left knee: Secondary | ICD-10-CM | POA: Diagnosis not present

## 2020-06-11 DIAGNOSIS — G8929 Other chronic pain: Secondary | ICD-10-CM

## 2020-06-11 DIAGNOSIS — M25661 Stiffness of right knee, not elsewhere classified: Secondary | ICD-10-CM | POA: Diagnosis present

## 2020-06-11 DIAGNOSIS — R600 Localized edema: Secondary | ICD-10-CM

## 2020-06-11 DIAGNOSIS — M25561 Pain in right knee: Secondary | ICD-10-CM | POA: Insufficient documentation

## 2020-06-11 DIAGNOSIS — R2689 Other abnormalities of gait and mobility: Secondary | ICD-10-CM

## 2020-06-11 DIAGNOSIS — M6281 Muscle weakness (generalized): Secondary | ICD-10-CM | POA: Diagnosis present

## 2020-06-11 NOTE — Therapy (Signed)
[place], [place], [address] Phone: [phone]   Fax:  [phone]  Physical Therapy Treatment  Patient Details  Name: Kayla Miles MRN: [id number] Date of Birth: [birth year] Referring Provider (PT): Jean Rosenthal MD   Encounter Date: 06/11/2020   PT End of Session - 06/11/20 1203    Visit Number 4    Number of Visits 15    Date for PT Re-Evaluation 07/20/20    Authorization Type [facility]    Authorization - Visit Number 3    Authorization - Number of Visits 15    Progress Note Due on Visit 10    PT Start Time 1200   pt late   PT Stop Time 6384    PT Time Calculation (min) 45 min           Past Medical History:  Diagnosis Date  . Arthralgia of knee, right   . Arthritis    left knee osteoarthritis   . Breast cancer ([place]) 03/26/14   Left  . HTN (hypertension)   . Hypothyroidism   . Pre-diabetes    05/15/20 - per pt, insisted that she was not diabetic or pre diabetic. pt does not check sugars at home and has never been diagnosed    Past Surgical History:  Procedure Laterality Date  . BREAST IMPLANT EXCHANGE Bilateral 05/03/2018  . MASTECTOMY Bilateral 2015   with reconstruction.   . TONSILLECTOMY    . TOTAL KNEE ARTHROPLASTY Right 05/29/2018  . TOTAL KNEE ARTHROPLASTY Right 05/29/2018   Procedure: RIGHT  TOTAL KNEE ARTHROPLASTY;  Surgeon: [name], MD;  Location: [facility];  Service: Orthopedics;  Laterality: Right;  . TOTAL KNEE ARTHROPLASTY Left 05/19/2020   Procedure: LEFT TOTAL KNEE ARTHROPLASTY;  Surgeon: [name], MD;  Location: [facility];  Service: Orthopedics;  Laterality: Left;    There were no vitals filed for this visit.   Subjective Assessment - 06/11/20 1202    Subjective I am tired, no pain, just tight. I got a ball to use on the wall. I feel weakness in my thigh.    Currently in Pain? No/denies   just tight             [facility] PT Assessment  - 06/11/20 0001      AROM   Left Knee Flexion 85                         [facility] Adult PT Treatment/Exercise - 06/11/20 0001      Ambulation/Gait   Assistive device Straight cane    Pre-Gait Activities retro gait for quad activation    Gait Comments cues for heel strike       Knee/Hip Exercises: Stretches   Active Hamstring Stretch Limitations seated      Hip Flexor Stretch 20 seconds    Hip Flexor Stretch Limitations x 3 reps with strap     Knee: Self-Stretch Limitations --    Other Knee/Hip Stretches knee to chest stretch       Knee/Hip Exercises: Aerobic   Nustep L2 LE/UE only x 6 minutes       Knee/Hip Exercises: Standing   Other Standing Knee Exercises TKE ball into wall x 10       Knee/Hip Exercises: Seated   Long Arc Quad 10 reps    Other Seated Knee/Hip Exercises over pressure into flexion      Knee/Hip Exercises: Supine  Quad Sets 20 reps   verbal, tactile and visual feedback   Quad Sets Limitations supine with heel prop     Heel Slides 10 reps    Heel Slides Limitations with and without strap       Vasopneumatic   Number Minutes Vasopneumatic  10 minutes    Vasopnuematic Location  Knee    Vasopneumatic Pressure Medium    Vasopneumatic Temperature  34      Manual Therapy   Manual therapy comments A/P joint mobs in supine  to femur and tibia to increased knee flexion and extension, PROM knee flexion and extension                     PT Short Term Goals - 05/25/20 1208      PT SHORT TERM GOAL #1   Title pt to be I with inital HEP    Baseline -    Time 3    Period Weeks    Status New    Target Date 06/15/20      PT SHORT TERM GOAL #2   Title pt to increase L knee AROM to >/= 10 - 90 degrees for therapuetic progression    Time 3    Period Weeks    Status New    Target Date 06/15/20      PT SHORT TERM GOAL #3   Title -      PT SHORT TERM GOAL #4   Title -    Baseline -             PT Long Term Goals - 05/25/20  1209      PT LONG TERM GOAL #1   Title Patient to demonstrate functional muscle strength as being MMT 5/5 in order to improve mechanics and reduce pain     Time 8    Period Weeks    Status New    Target Date 07/20/20      PT LONG TERM GOAL #2   Title Patient to be able to reciprocally ascend/descend full flight of stairs with U railing, reciprocal pattern, good eccenric control in order to improve home/community access     Baseline -    Time 8    Period Weeks    Status New      PT LONG TERM GOAL #3   Title pt to be able to walk/ stand >/= 60 min with LRAD for community ambulation and assist with work related tasks.    Baseline -    Time 8    Period Weeks    Status New    Target Date 07/20/20      PT LONG TERM GOAL #4   Title increase FOTO score to </= 30% limited to demo improvement in function    Time 8    Period Weeks    Status New    Target Date 07/20/20      PT LONG TERM GOAL #5   Title pt to be I with all HEP given to maintain and progress current level of function    Time 8    Period Weeks    Status New    Target Date 07/20/20                 Plan - 06/11/20 1252    Clinical Impression Statement Pt reports fatigue today and has antalgic gait with SPC. Reviewed gait mechanics encouraging full knee extension. COntinued with TKE activities and quad activation. Manual  joint mobs and PROM perfomed for Knee flexion and extenion.    PT Next Visit Plan review / update HEP PRN, capture FOTO status at visit 6, knee mobs/ AAROM, hip / knee strengthening, gait training, vaso for pain/ swelling    PT Home Exercise Plan [id number] - seated and supine hamstring stretch, supine and seated heel slide, quad set with towel under knee, sidelying hip abduction,           Patient will benefit from skilled therapeutic intervention in order to improve the following deficits and impairments:  Improper body mechanics, Decreased strength, Increased muscle spasms, Abnormal gait,  Postural dysfunction, Decreased range of motion, Decreased activity tolerance, Decreased balance, Decreased endurance, Pain, Increased edema  Visit Diagnosis: Chronic pain of left knee  Localized edema  Muscle weakness (generalized)  Other abnormalities of gait and mobility     Problem List Patient Active Problem List   Diagnosis Date Noted  . 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 Status post total right knee replacement 07/12/2018  . Primary osteoarthritis of left knee 04/11/2018  . Unilateral primary osteoarthritis, right knee 03/07/2018  . Chronic pain of right knee 03/07/2018  . Chronic pain of left knee 03/07/2018  . Malignant neoplasm of central portion of left female breast ([place]) 04/16/2014    Dorene Ar, PTA 06/11/2020, 12:57 PM  [facility] [address] [place], [place], [address] Phone: [phone]   Fax:  [phone]  Name: Kayla Miles MRN: [id number] Date of Birth: 03-29-1951

## 2020-06-16 ENCOUNTER — Other Ambulatory Visit: Payer: Self-pay

## 2020-06-16 ENCOUNTER — Ambulatory Visit: Payer: No Typology Code available for payment source | Admitting: Physical Therapy

## 2020-06-16 ENCOUNTER — Encounter: Payer: Self-pay | Admitting: Physical Therapy

## 2020-06-16 DIAGNOSIS — M6281 Muscle weakness (generalized): Secondary | ICD-10-CM

## 2020-06-16 DIAGNOSIS — R600 Localized edema: Secondary | ICD-10-CM

## 2020-06-16 DIAGNOSIS — M25562 Pain in left knee: Secondary | ICD-10-CM

## 2020-06-16 DIAGNOSIS — R2689 Other abnormalities of gait and mobility: Secondary | ICD-10-CM

## 2020-06-16 NOTE — Therapy (Signed)
[place], [place], [address] Phone: [phone]   Fax:  [phone]  Physical Therapy Treatment  Patient Details  Name: Kayla Miles MRN: [id number] Date of Birth: [birth year] Referring Provider (PT): Jean Rosenthal MD   Encounter Date: 06/16/2020   PT End of Session - 06/16/20 0941    Visit Number 5    Number of Visits 15    Date for PT Re-Evaluation 07/20/20    Authorization Type [facility]    Authorization - Visit Number 4    Authorization - Number of Visits 15    Progress Note Due on Visit 10    PT Start Time 0935    PT Stop Time 1025    PT Time Calculation (min) 50 min           Past Medical History:  Diagnosis Date  . Arthralgia of knee, right   . Arthritis    left knee osteoarthritis   . Breast cancer ([place]) 03/26/14   Left  . HTN (hypertension)   . Hypothyroidism   . Pre-diabetes    05/15/20 - per pt, insisted that she was not diabetic or pre diabetic. pt does not check sugars at home and has never been diagnosed    Past Surgical History:  Procedure Laterality Date  . BREAST IMPLANT EXCHANGE Bilateral 05/03/2018  . MASTECTOMY Bilateral 2015   with reconstruction.   . TONSILLECTOMY    . TOTAL KNEE ARTHROPLASTY Right 05/29/2018  . TOTAL KNEE ARTHROPLASTY Right 05/29/2018   Procedure: RIGHT  TOTAL KNEE ARTHROPLASTY;  Surgeon: [name], MD;  Location: [facility];  Service: Orthopedics;  Laterality: Right;  . TOTAL KNEE ARTHROPLASTY Left 05/19/2020   Procedure: LEFT TOTAL KNEE ARTHROPLASTY;  Surgeon: [name], MD;  Location: [facility];  Service: Orthopedics;  Laterality: Left;    There were no vitals filed for this visit.   Subjective Assessment - 06/16/20 0938    Subjective I have been in pain since you stretched it last time. Feels sore and tight, having a hard time sleeping.    Currently in Pain? Yes    Pain Score 6     Pain Location Knee    Pain  Orientation Left    Pain Descriptors / Indicators Aching;Tightness;Sore    Pain Type Surgical pain    Aggravating Factors  stretching, sleeeping    Pain Relieving Factors ice, rest              [facility] PT Assessment - 06/16/20 0001      AROM   Left Knee Extension 15    Left Knee Flexion 85                         [facility] Adult PT Treatment/Exercise - 06/16/20 0001      Knee/Hip Exercises: Stretches   Knee: Self-Stretch Limitations at step for flexion and extension    Gastroc Stretch Limitations runners stretch       Knee/Hip Exercises: Aerobic   Nustep L3 LE/UE only x 5 minutes       Knee/Hip Exercises: Machines for Strengthening   Cybex Leg Press 1 plate bilat and single leg       Knee/Hip Exercises: Standing   Heel Raises 15 reps    Knee Flexion 10 reps    Lateral Step Up 15 reps;Hand Hold: 1;Step Height: 4"    Forward Step Up 15 reps;Hand Hold: 1;Step Height: 4"  Gait Training retro gait along counter for quad activation    Other Standing Knee Exercises TKE ball into wall x 10       Knee/Hip Exercises: Seated   Long Arc Quad 10 reps;20 reps;Weights    Long Arc Quad Weight 5 lbs.    Other Seated Knee/Hip Exercises over pressure into flexion      Knee/Hip Exercises: Supine   Short Arc Quad Sets 20 reps    Straight Leg Raises 5 reps      Vasopneumatic   Number Minutes Vasopneumatic  10 minutes    Vasopnuematic Location  Knee    Vasopneumatic Pressure Medium    Vasopneumatic Temperature  38      Manual Therapy   Manual therapy comments Patella mobs                     PT Short Term Goals - 05/25/20 1208      PT SHORT TERM GOAL #1   Title pt to be I with inital HEP    Baseline -    Time 3    Period Weeks    Status New    Target Date 06/15/20      PT SHORT TERM GOAL #2   Title pt to increase L knee AROM to >/= 10 - 90 degrees for therapuetic progression    Time 3    Period Weeks    Status New    Target Date 06/15/20       PT SHORT TERM GOAL #3   Title -      PT SHORT TERM GOAL #4   Title -    Baseline -             PT Long Term Goals - 05/25/20 1209      PT LONG TERM GOAL #1   Title Patient to demonstrate functional muscle strength as being MMT 5/5 in order to improve mechanics and reduce pain     Time 8    Period Weeks    Status New    Target Date 07/20/20      PT LONG TERM GOAL #2   Title Patient to be able to reciprocally ascend/descend full flight of stairs with U railing, reciprocal pattern, good eccenric control in order to improve home/community access     Baseline -    Time 8    Period Weeks    Status New      PT LONG TERM GOAL #3   Title pt to be able to walk/ stand >/= 60 min with LRAD for community ambulation and assist with work related tasks.    Baseline -    Time 8    Period Weeks    Status New    Target Date 07/20/20      PT LONG TERM GOAL #4   Title increase FOTO score to </= 30% limited to demo improvement in function    Time 8    Period Weeks    Status New    Target Date 07/20/20      PT LONG TERM GOAL #5   Title pt to be I with all HEP given to maintain and progress current level of function    Time 8    Period Weeks    Status New    Target Date 07/20/20                 Plan - 06/16/20 0940    Clinical Impression Statement  Pt reports increased pain since last session. She has antalgic gait pattern and visually swollen left knee. Worked on gentle ROM and qud activation. Introduced 4 inch step ups and light leg press. Encouraged more frequent elevation and ice application to reduce swelling. Instructed pt in lymph activation with slef retro massage for edema management. Vasopneumatic device used at end of session to decreased edema. She reported less pan at end of session.    PT Next Visit Plan review / update HEP PRN, capture FOTO status at visit 6, knee mobs/ AAROM, hip / knee strengthening, gait training, vaso for pain/ swelling    PT Home Exercise Plan  [id number] - seated and supine hamstring stretch, supine and seated heel slide, quad set with towel under knee, sidelying hip abduction,           Patient will benefit from skilled therapeutic intervention in order to improve the following deficits and impairments:  Improper body mechanics, Decreased strength, Increased muscle spasms, Abnormal gait, Postural dysfunction, Decreased range of motion, Decreased activity tolerance, Decreased balance, Decreased endurance, Pain, Increased edema  Visit Diagnosis: Localized edema  Chronic pain of left knee  Muscle weakness (generalized)  Other abnormalities of gait and mobility     Problem List Patient Active Problem List   Diagnosis Date Noted  . 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 Status post total right knee replacement 07/12/2018  . Primary osteoarthritis of left knee 04/11/2018  . Unilateral primary osteoarthritis, right knee 03/07/2018  . Chronic pain of right knee 03/07/2018  . Chronic pain of left knee 03/07/2018  . Malignant neoplasm of central portion of left female breast ([place]) 04/16/2014    Dorene Ar, PTA 06/16/2020, 10:57 AM  [place], [place], [address] Phone: [phone]   Fax:  [phone]  Name: Shirlena Brinegar MRN: [id number] Date of Birth: May 05, 1951

## 2020-06-18 ENCOUNTER — Other Ambulatory Visit: Payer: Self-pay

## 2020-06-18 ENCOUNTER — Encounter: Payer: Self-pay | Admitting: Physical Therapy

## 2020-06-18 ENCOUNTER — Ambulatory Visit: Payer: No Typology Code available for payment source | Admitting: Physical Therapy

## 2020-06-18 DIAGNOSIS — R2689 Other abnormalities of gait and mobility: Secondary | ICD-10-CM

## 2020-06-18 DIAGNOSIS — G8929 Other chronic pain: Secondary | ICD-10-CM

## 2020-06-18 DIAGNOSIS — M25562 Pain in left knee: Secondary | ICD-10-CM

## 2020-06-18 DIAGNOSIS — M6281 Muscle weakness (generalized): Secondary | ICD-10-CM

## 2020-06-18 DIAGNOSIS — R600 Localized edema: Secondary | ICD-10-CM

## 2020-06-18 NOTE — Therapy (Signed)
[place], [place], [address] Phone: [phone]   Fax:  [phone]  Physical Therapy Treatment  Patient Details  Name: Kayla Miles MRN: [id number] Date of Birth: [birth year] Referring Provider (PT): Jean Rosenthal MD   Encounter Date: 06/18/2020   PT End of Session - 06/18/20 0926    Visit Number 6    Number of Visits 15    Date for PT Re-Evaluation 07/20/20    Authorization Type [facility]    Authorization - Visit Number 5    Authorization - Number of Visits 15    Progress Note Due on Visit 10    PT Start Time 0902   17 minutes late   PT Stop Time 0940    PT Time Calculation (min) 38 min           Past Medical History:  Diagnosis Date  . Arthralgia of knee, right   . Arthritis    left knee osteoarthritis   . Breast cancer ([place]) 03/26/14   Left  . HTN (hypertension)   . Hypothyroidism   . Pre-diabetes    05/15/20 - per pt, insisted that she was not diabetic or pre diabetic. pt does not check sugars at home and has never been diagnosed    Past Surgical History:  Procedure Laterality Date  . BREAST IMPLANT EXCHANGE Bilateral 05/03/2018  . MASTECTOMY Bilateral 2015   with reconstruction.   . TONSILLECTOMY    . TOTAL KNEE ARTHROPLASTY Right 05/29/2018  . TOTAL KNEE ARTHROPLASTY Right 05/29/2018   Procedure: RIGHT  TOTAL KNEE ARTHROPLASTY;  Surgeon: [name], MD;  Location: [facility];  Service: Orthopedics;  Laterality: Right;  . TOTAL KNEE ARTHROPLASTY Left 05/19/2020   Procedure: LEFT TOTAL KNEE ARTHROPLASTY;  Surgeon: [name], MD;  Location: [facility];  Service: Orthopedics;  Laterality: Left;    There were no vitals filed for this visit.   Subjective Assessment - 06/18/20 0924    Subjective The knee is better I think. I went to the pool yesterday and    Currently in Pain? Yes    Pain Score 4     Pain Location Knee    Pain Orientation Left    Pain  Descriptors / Indicators Aching;Tightness    Pain Type Surgical pain              [facility] PT Assessment - 06/18/20 0001      Observation/Other Assessments   Focus on Therapeutic Outcomes (FOTO)   Pt late, will capture next session                         [facility] Adult PT Treatment/Exercise - 06/18/20 0001      Knee/Hip Exercises: Stretches   Knee: Self-Stretch Limitations at step for flexion and extension    Gastroc Stretch Limitations runners stretch       Knee/Hip Exercises: Aerobic   Nustep L3 Le only x 5 minutes       Knee/Hip Exercises: Machines for Strengthening   Cybex Leg Press 1 plate bilat and single leg       Knee/Hip Exercises: Standing   Knee Flexion 15 reps    Lateral Step Up 15 reps;Hand Hold: 1;Step Height: 6"    Forward Step Up 15 reps;Hand Hold: 1;Step Height: 6"    Functional Squat Limitations sink squats x 10     Wall Squat 10 reps    Gait Training retro  gait along counter for quad activation    Other Standing Knee Exercises --      Vasopneumatic   Number Minutes Vasopneumatic  10 minutes    Vasopnuematic Location  Knee    Vasopneumatic Pressure Medium    Vasopneumatic Temperature  38                    PT Short Term Goals - 05/25/20 1208      PT SHORT TERM GOAL #1   Title pt to be I with inital HEP    Baseline -    Time 3    Period Weeks    Status New    Target Date 06/15/20      PT SHORT TERM GOAL #2   Title pt to increase L knee AROM to >/= 10 - 90 degrees for therapuetic progression    Time 3    Period Weeks    Status New    Target Date 06/15/20      PT SHORT TERM GOAL #3   Title -      PT SHORT TERM GOAL #4   Title -    Baseline -             PT Long Term Goals - 05/25/20 1209      PT LONG TERM GOAL #1   Title Patient to demonstrate functional muscle strength as being MMT 5/5 in order to improve mechanics and reduce pain     Time 8    Period Weeks    Status New    Target Date 07/20/20       PT LONG TERM GOAL #2   Title Patient to be able to reciprocally ascend/descend full flight of stairs with U railing, reciprocal pattern, good eccenric control in order to improve home/community access     Baseline -    Time 8    Period Weeks    Status New      PT LONG TERM GOAL #3   Title pt to be able to walk/ stand >/= 60 min with LRAD for community ambulation and assist with work related tasks.    Baseline -    Time 8    Period Weeks    Status New    Target Date 07/20/20      PT LONG TERM GOAL #4   Title increase FOTO score to </= 30% limited to demo improvement in function    Time 8    Period Weeks    Status New    Target Date 07/20/20      PT LONG TERM GOAL #5   Title pt to be I with all HEP given to maintain and progress current level of function    Time 8    Period Weeks    Status New    Target Date 07/20/20                 Plan - 06/18/20 0539    Clinical Impression Statement Pt arrived 17 minutes late today. Focused closed chain strength and ROM with good tolerance and less pain today.    PT Next Visit Plan review / update HEP PRN, capture FOTO status at NEXT VISIT , knee mobs/ AAROM, hip / knee strengthening, gait training, vaso for pain/ swelling    PT Home Exercise Plan [id number] - seated and supine hamstring stretch, supine and seated heel slide, quad set with towel under knee, sidelying hip abduction,  Patient will benefit from skilled therapeutic intervention in order to improve the following deficits and impairments:  Improper body mechanics, Decreased strength, Increased muscle spasms, Abnormal gait, Postural dysfunction, Decreased range of motion, Decreased activity tolerance, Decreased balance, Decreased endurance, Pain, Increased edema  Visit Diagnosis: Localized edema  Chronic pain of left knee  Muscle weakness (generalized)  Other abnormalities of gait and mobility     Problem List Patient Active Problem List   Diagnosis  Date Noted  . 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 Status post total right knee replacement 07/12/2018  . Primary osteoarthritis of left knee 04/11/2018  . Unilateral primary osteoarthritis, right knee 03/07/2018  . Chronic pain of right knee 03/07/2018  . Chronic pain of left knee 03/07/2018  . Malignant neoplasm of central portion of left female breast ([place]) 04/16/2014    Dorene Ar, PTA 06/18/2020, 9:58 AM  [place], [place], [address] Phone: [phone]   Fax:  [phone]  Name: Kayla Miles MRN: [id number] Date of Birth: 02-Oct-1951

## 2020-06-22 ENCOUNTER — Other Ambulatory Visit: Payer: Self-pay

## 2020-06-22 ENCOUNTER — Encounter: Payer: Self-pay | Admitting: Physical Therapy

## 2020-06-22 ENCOUNTER — Ambulatory Visit: Payer: No Typology Code available for payment source | Admitting: Physical Therapy

## 2020-06-22 DIAGNOSIS — M25562 Pain in left knee: Secondary | ICD-10-CM

## 2020-06-22 DIAGNOSIS — M6281 Muscle weakness (generalized): Secondary | ICD-10-CM

## 2020-06-22 DIAGNOSIS — R600 Localized edema: Secondary | ICD-10-CM

## 2020-06-22 DIAGNOSIS — G8929 Other chronic pain: Secondary | ICD-10-CM

## 2020-06-22 DIAGNOSIS — R2689 Other abnormalities of gait and mobility: Secondary | ICD-10-CM

## 2020-06-22 NOTE — Therapy (Signed)
[place], [place], [address] Phone: [phone]   Fax:  [phone]  Physical Therapy Treatment  Patient Details  Name: Kayla Miles MRN: [id number] Date of Birth: 1951/11/21 Referring Provider (PT): Jean Rosenthal MD   Encounter Date: 06/22/2020   PT End of Session - 06/22/20 1108    Visit Number 7    Number of Visits 15    Date for PT Re-Evaluation 07/20/20    Authorization Type VA    Authorization - Visit Number 6    Authorization - Number of Visits 15    Progress Note Due on Visit 10    PT Start Time 9381    PT Stop Time 1155    PT Time Calculation (min) 50 min           Past Medical History:  Diagnosis Date  . Arthralgia of knee, right   . Arthritis    left knee osteoarthritis   . Breast cancer ([place]) 03/26/14   Left  . HTN (hypertension)   . Hypothyroidism   . Pre-diabetes    05/15/20 - per pt, insisted that she was not diabetic or pre diabetic. pt does not check sugars at home and has never been diagnosed    Past Surgical History:  Procedure Laterality Date  . BREAST IMPLANT EXCHANGE Bilateral 05/03/2018  . MASTECTOMY Bilateral 2015   with reconstruction.   . TONSILLECTOMY    . TOTAL KNEE ARTHROPLASTY Right 05/29/2018  . TOTAL KNEE ARTHROPLASTY Right 05/29/2018   Procedure: RIGHT  TOTAL KNEE ARTHROPLASTY;  Surgeon: [name], MD;  Location: [facility];  Service: Orthopedics;  Laterality: Right;  . TOTAL KNEE ARTHROPLASTY Left 05/19/2020   Procedure: LEFT TOTAL KNEE ARTHROPLASTY;  Surgeon: [name], MD;  Location: [facility];  Service: Orthopedics;  Laterality: Left;    There were no vitals filed for this visit.       [facility] PT Assessment - 06/22/20 0001      Observation/Other Assessments   Focus on Therapeutic Outcomes (FOTO)  44% limited       AROM   Left Knee Flexion 95                         [facility] Adult PT  Treatment/Exercise - 06/22/20 0001      Knee/Hip Exercises: Stretches   Knee: Self-Stretch to increase Flexion 3 reps   scoot stretch x 3    Knee: Self-Stretch Limitations at step for flexion and extension      Knee/Hip Exercises: Aerobic   Recumbent Bike partial revolutions x 3 minutes     Nustep L5 Le only x 5 min      Knee/Hip Exercises: Machines for Strengthening   Cybex Knee Extension 5# bilat up and single eccentric lower x 15     Cybex Knee Flexion 20#bilateral  x 15    Cybex Leg Press 2 plates bilateral , 1 plates single leg       Knee/Hip Exercises: Standing   Lateral Step Up 1 set;15 reps;Hand Hold: 1;Step Height: 8"      Knee/Hip Exercises: Seated   Hamstring Curl 20 reps    Hamstring Limitations red      Knee/Hip Exercises: Supine   Quad Sets 20 reps   verbal, tactile and visual feedback   Straight Leg Raises 10 reps  PT Education - 06/22/20 1153    Education Details FOTO status    Person(s) Educated Patient    Methods Explanation;Handout    Comprehension Verbalized understanding            PT Short Term Goals - 05/25/20 1208      PT SHORT TERM GOAL #1   Title pt to be I with inital HEP    Baseline -    Time 3    Period Weeks    Status New    Target Date 06/15/20      PT SHORT TERM GOAL #2   Title pt to increase L knee AROM to >/= 10 - 90 degrees for therapuetic progression    Time 3    Period Weeks    Status New    Target Date 06/15/20      PT SHORT TERM GOAL #3   Title -      PT SHORT TERM GOAL #4   Title -    Baseline -             PT Long Term Goals - 05/25/20 1209      PT LONG TERM GOAL #1   Title Patient to demonstrate functional muscle strength as being MMT 5/5 in order to improve mechanics and reduce pain     Time 8    Period Weeks    Status New    Target Date 07/20/20      PT LONG TERM GOAL #2   Title Patient to be able to reciprocally ascend/descend full flight of stairs with U railing,  reciprocal pattern, good eccenric control in order to improve home/community access     Baseline -    Time 8    Period Weeks    Status New      PT LONG TERM GOAL #3   Title pt to be able to walk/ stand >/= 60 min with LRAD for community ambulation and assist with work related tasks.    Baseline -    Time 8    Period Weeks    Status New    Target Date 07/20/20      PT LONG TERM GOAL #4   Title increase FOTO score to </= 30% limited to demo improvement in function    Time 8    Period Weeks    Status New    Target Date 07/20/20      PT LONG TERM GOAL #5   Title pt to be I with all HEP given to maintain and progress current level of function    Time 8    Period Weeks    Status New    Target Date 07/20/20                 Plan - 06/22/20 1150    Clinical Impression Statement Good improvement in flexion ROM. Still lacks extension ROM and strength. Began knee flexion and extension machine with good tolerance. FOTO statues captured and reviewed with patient.    PT Next Visit Plan review / update HEP PRN,  , knee mobs/ AAROM, hip / knee strengthening, gait training, vaso for pain/ swelling    PT Home Exercise Plan [id number] - seated and supine hamstring stretch, supine and seated heel slide, quad set with towel under knee, sidelying hip abduction,           Patient will benefit from skilled therapeutic intervention in order to improve the following deficits and impairments:  Improper body  mechanics, Decreased strength, Increased muscle spasms, Abnormal gait, Postural dysfunction, Decreased range of motion, Decreased activity tolerance, Decreased balance, Decreased endurance, Pain, Increased edema  Visit Diagnosis: Localized edema  Chronic pain of left knee  Muscle weakness (generalized)  Other abnormalities of gait and mobility     Problem List Patient Active Problem List   Diagnosis Date Noted  . 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 Status post  total right knee replacement 07/12/2018  . Primary osteoarthritis of left knee 04/11/2018  . Unilateral primary osteoarthritis, right knee 03/07/2018  . Chronic pain of right knee 03/07/2018  . Chronic pain of left knee 03/07/2018  . Malignant neoplasm of central portion of left female breast ([place]) 04/16/2014    Dorene Ar, PTA 06/22/2020, 11:56 AM  [place], [place], [address] Phone: [phone]   Fax:  [phone]  Name: Kayla Miles MRN: [id number] Date of Birth: [birth year]

## 2020-06-24 ENCOUNTER — Encounter: Payer: Self-pay | Admitting: Physical Therapy

## 2020-06-24 ENCOUNTER — Other Ambulatory Visit: Payer: Self-pay

## 2020-06-24 ENCOUNTER — Ambulatory Visit: Payer: No Typology Code available for payment source | Admitting: Physical Therapy

## 2020-06-24 DIAGNOSIS — R2689 Other abnormalities of gait and mobility: Secondary | ICD-10-CM

## 2020-06-24 DIAGNOSIS — M25562 Pain in left knee: Secondary | ICD-10-CM | POA: Diagnosis not present

## 2020-06-24 DIAGNOSIS — M25661 Stiffness of right knee, not elsewhere classified: Secondary | ICD-10-CM

## 2020-06-24 DIAGNOSIS — R600 Localized edema: Secondary | ICD-10-CM

## 2020-06-24 DIAGNOSIS — M6281 Muscle weakness (generalized): Secondary | ICD-10-CM

## 2020-06-24 DIAGNOSIS — M25561 Pain in right knee: Secondary | ICD-10-CM

## 2020-06-24 NOTE — Therapy (Signed)
[place], [place], [address] Phone: [phone]   Fax:  [phone]  Physical Therapy Treatment Progress Note Reporting Period 05/25/2020 to 06/24/2020  See note below for Objective Data and Assessment of Progress/Goals.       Patient Details  Name: Kayla Miles MRN: [id number] Date of Birth: 08/20/1951 Referring Provider (PT): Jean Rosenthal MD   Encounter Date: 06/24/2020   PT End of Session - 06/24/20 0905    Visit Number 8    Number of Visits 15    Date for PT Re-Evaluation 07/20/20    Authorization Type VA    Authorization - Visit Number 7    Authorization - Number of Visits 15    Progress Note Due on Visit 18    PT Start Time 0905   pt arrived 15 min late   PT Stop Time 0931    PT Time Calculation (min) 26 min    Activity Tolerance Patient tolerated treatment well    Behavior During Therapy [facility] for tasks assessed/performed           Past Medical History:  Diagnosis Date  . Arthralgia of knee, right   . Arthritis    left knee osteoarthritis   . Breast cancer ([place]) 03/26/14   Left  . HTN (hypertension)   . Hypothyroidism   . Pre-diabetes    05/15/20 - per pt, insisted that she was not diabetic or pre diabetic. pt does not check sugars at home and has never been diagnosed    Past Surgical History:  Procedure Laterality Date  . BREAST IMPLANT EXCHANGE Bilateral 05/03/2018  . MASTECTOMY Bilateral 2015   with reconstruction.   . TONSILLECTOMY    . TOTAL KNEE ARTHROPLASTY Right 05/29/2018  . TOTAL KNEE ARTHROPLASTY Right 05/29/2018   Procedure: RIGHT  TOTAL KNEE ARTHROPLASTY;  Surgeon: [name], MD;  Location: [facility];  Service: Orthopedics;  Laterality: Right;  . TOTAL KNEE ARTHROPLASTY Left 05/19/2020   Procedure: LEFT TOTAL KNEE ARTHROPLASTY;  Surgeon: [name], MD;  Location: [facility];  Service: Orthopedics;  Laterality: Left;    There were  no vitals filed for this visit.   Subjective Assessment - 06/24/20 0908    Subjective "I feel like I am doing pretty good. Still getting some pain inthe knee. I went to the pool and I think that made me sore."    Patient Stated Goals strengthening the knee, move on with life.    Currently in Pain? Yes    Pain Score 1     Pain Orientation Left    Pain Descriptors / Indicators Aching    Pain Onset More than a month ago    Pain Frequency Intermittent    Aggravating Factors  laying down flat, straightening the knee    Pain Relieving Factors ice, rest, pillow              [facility] PT Assessment - 06/24/20 0001      Assessment   Medical Diagnosis L TKA    Referring Provider (PT) Jean Rosenthal MD    Onset Date/Surgical Date 05/19/20    Hand Dominance Right    Next MD Visit 06/29/2020      AROM   Left Knee Extension 18    Left Knee Flexion 90                         [facility] Adult PT Treatment/Exercise -  06/24/20 0001      Knee/Hip Exercises: Stretches   Passive Hamstring Stretch Left;2 reps;30 seconds   PNF contract/ relax     Knee/Hip Exercises: Seated   Long Arc Quad 2 sets;15 reps;Left    Long Arc Quad Weight 5 lbs.    Hamstring Curl Strengthening;20 reps    Hamstring Limitations red      Manual Therapy   Manual Therapy Joint mobilization    Manual therapy comments trigger point release along semi-membranosus, and tack and stretch techniques    Joint Mobilization tibiofemiral PA mobs grade III                  PT Education - 06/24/20 0932    Education Details updated HEP for prone hang to promote knee extension    Person(s) Educated Patient    Methods Explanation;Verbal cues;Handout    Comprehension Verbalized understanding;Verbal cues required            PT Short Term Goals - 06/24/20 1239      PT SHORT TERM GOAL #1   Title pt to be I with inital HEP    Period Weeks    Status Achieved      PT SHORT TERM GOAL #2   Title pt to  increase L knee AROM to >/= 10 - 90 degrees for therapuetic progression    Baseline 20 -90 on 7/14    Period Weeks    Status On-going             PT Long Term Goals - 06/24/20 1240      PT LONG TERM GOAL #1   Title Patient to demonstrate functional muscle strength as being MMT 5/5 in order to improve mechanics and reduce pain     Baseline -    Period Weeks    Status On-going      PT LONG TERM GOAL #2   Title Patient to be able to reciprocally ascend/descend full flight of stairs with U railing, reciprocal pattern, good eccenric control in order to improve home/community access     Status On-going      PT LONG TERM GOAL #3   Title pt to be able to walk/ stand >/= 60 min with LRAD for community ambulation and assist with work related tasks.    Period Weeks    Status On-going      PT LONG TERM GOAL #4   Title increase FOTO score to </= 30% limited to demo improvement in function    Period Weeks    Status On-going      PT LONG TERM GOAL #5   Title pt to be I with all HEP given to maintain and progress current level of function    Period Weeks    Status On-going                 Plan - 06/24/20 2951    Clinical Impression Statement limited session due to pt arriving late today. She is making progress with knee flexion at 90 degrees today but continues to lack extension secondary to posterior knee stiffness/ soreness. Focused on STW along the hamstrings and tibiofemoral mobs to promote flexion/ extension. end of session she noted decreased soreness with standing/ walking. She would benefit from continued physical therapy to decrease L knee pain, improve ROM, increase strength and maximize her function.    PT Treatment/Interventions ADLs/Self Care Home Management;Cryotherapy;Electrical Stimulation;Iontophoresis 4mg /ml Dexamethasone;Moist Heat;Ultrasound;Gait training;Therapeutic exercise;Therapeutic activities;Neuromuscular re-education;Balance training;Passive range of  motion;Taping;Vasopneumatic Device;Patient/family  education    PT Next Visit Plan review / update HEP PRN,  , knee mobs/ AAROM, hip / knee strengthening, gait training, vaso for pain/ swelling    PT Home Exercise Plan [id number] - seated and supine hamstring stretch, supine and seated heel slide, quad set with towel under knee, sidelying hip abduction, prone knee hang    Consulted and Agree with Plan of Care Patient           Patient will benefit from skilled therapeutic intervention in order to improve the following deficits and impairments:  Improper body mechanics, Decreased strength, Increased muscle spasms, Abnormal gait, Postural dysfunction, Decreased range of motion, Decreased activity tolerance, Decreased balance, Decreased endurance, Pain, Increased edema  Visit Diagnosis: Localized edema  Chronic pain of left knee  Muscle weakness (generalized)  Other abnormalities of gait and mobility  Acute pain of right knee  Stiffness of right knee, not elsewhere classified     Problem List Patient Active Problem List   Diagnosis Date Noted  . 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 Status post total right knee replacement 07/12/2018  . Primary osteoarthritis of left knee 04/11/2018  . Unilateral primary osteoarthritis, right knee 03/07/2018  . Chronic pain of right knee 03/07/2018  . Chronic pain of left knee 03/07/2018  . Malignant neoplasm of central portion of left female breast ([place]) 04/16/2014    Starr Lake PT, DPT, LAT, ATC  06/24/20  12:41 PM      [facility] [address] [place], [place], [address] Phone: [phone]   Fax:  [phone]  Name: Kayla Miles MRN: [id number] Date of Birth: [birth year]

## 2020-06-29 ENCOUNTER — Ambulatory Visit (INDEPENDENT_AMBULATORY_CARE_PROVIDER_SITE_OTHER): Payer: No Typology Code available for payment source | Admitting: Orthopaedic Surgery

## 2020-06-29 ENCOUNTER — Encounter: Payer: Self-pay | Admitting: Orthopaedic Surgery

## 2020-06-29 ENCOUNTER — Other Ambulatory Visit: Payer: Self-pay

## 2020-06-29 VITALS — Ht 64.0 in | Wt 190.0 lb

## 2020-06-29 DIAGNOSIS — Z96652 Presence of left artificial knee joint: Secondary | ICD-10-CM

## 2020-06-29 NOTE — Progress Notes (Signed)
The patient is now almost 6 weeks status post a left total knee arthroplasty.  She has a remote history of a left total knee.  She says she is ambulate a cane and doing well.  She says the most they flexed her back in therapy is about 95 degrees.  On exam today the knee is still swollen to be expected.  There is no evidence of infection or redness.  The knee is ligamentously stable on the left side.  She lacks full extension by about 3 to 5 degrees and I can flex her to about 95 degrees.  She will continue to push herself through therapy.  She has not any pain medications at all.  All questions and concerns were answered addressed.  We will see her back in 4 weeks for repeat examination and no x-rays are needed.

## 2020-07-01 ENCOUNTER — Encounter: Payer: Self-pay | Admitting: Physical Therapy

## 2020-07-01 ENCOUNTER — Other Ambulatory Visit: Payer: Self-pay

## 2020-07-01 ENCOUNTER — Ambulatory Visit: Payer: No Typology Code available for payment source | Admitting: Physical Therapy

## 2020-07-01 DIAGNOSIS — M25562 Pain in left knee: Secondary | ICD-10-CM | POA: Diagnosis not present

## 2020-07-01 DIAGNOSIS — R600 Localized edema: Secondary | ICD-10-CM

## 2020-07-01 DIAGNOSIS — M6281 Muscle weakness (generalized): Secondary | ICD-10-CM

## 2020-07-01 DIAGNOSIS — R2689 Other abnormalities of gait and mobility: Secondary | ICD-10-CM

## 2020-07-01 DIAGNOSIS — G8929 Other chronic pain: Secondary | ICD-10-CM

## 2020-07-01 NOTE — Therapy (Signed)
[place], [place], [address] Phone: [phone]   Fax:  [phone]  Physical Therapy Treatment  Patient Details  Name: Kayla Miles MRN: [id number] Date of Birth: 31-Jul-1951 Referring Provider (PT): Jean Rosenthal MD   Encounter Date: 07/01/2020   PT End of Session - 07/01/20 1110    Visit Number 9    Number of Visits 15    Date for PT Re-Evaluation 07/20/20    Authorization Type [facility]    Authorization - Visit Number 8    Authorization - Number of Visits 15    Progress Note Due on Visit 18    PT Start Time 1105    PT Stop Time 1200    PT Time Calculation (min) 55 min           Past Medical History:  Diagnosis Date   Arthralgia of knee, right    Arthritis    left knee osteoarthritis    Breast cancer ([place]) 03/26/14   Left   HTN (hypertension)    Hypothyroidism    Pre-diabetes    05/15/20 - per pt, insisted that she was not diabetic or pre diabetic. pt does not check sugars at home and has never been diagnosed    Past Surgical History:  Procedure Laterality Date   BREAST IMPLANT EXCHANGE Bilateral 05/03/2018   MASTECTOMY Bilateral 2015   with reconstruction.    TONSILLECTOMY     TOTAL KNEE ARTHROPLASTY Right 05/29/2018   TOTAL KNEE ARTHROPLASTY Right 05/29/2018   Procedure: RIGHT  TOTAL KNEE ARTHROPLASTY;  Surgeon: [name], MD;  Location: [place];  Service: Orthopedics;  Laterality: Right;   TOTAL KNEE ARTHROPLASTY Left 05/19/2020   Procedure: LEFT TOTAL KNEE ARTHROPLASTY;  Surgeon: [name], MD;  Location: [facility];  Service: Orthopedics;  Laterality: Left;    There were no vitals filed for this visit.   Subjective Assessment - 07/01/20 1111    Subjective MD said I am doing good.    Currently in Pain? No/denies                             [facility] Adult PT Treatment/Exercise - 07/01/20 0001      Knee/Hip Exercises:  Stretches   Knee: Self-Stretch to increase Flexion 3 reps   scoot stretch x 3    Knee: Self-Stretch Limitations at step for flexion and extension    Other Knee/Hip Stretches Prone knee hang x 5 minutes       Knee/Hip Exercises: Aerobic   Recumbent Bike partial to full revolutions       Knee/Hip Exercises: Machines for Strengthening   Cybex Leg Press 2 plates bilateral , 1 plates single leg       Knee/Hip Exercises: Supine   Quad Sets 20 reps   verbal, tactile and visual feedback   Short Arc [employer] 20 reps    Short Arc Quad Sets Limitations 3#     Straight Leg Raises 5 reps      Vasopneumatic   Number Minutes Vasopneumatic  10 minutes    Vasopnuematic Location  Knee    Vasopneumatic Pressure Medium    Vasopneumatic Temperature  38                    PT Short Term Goals - 06/24/20 1239      PT SHORT TERM GOAL #1   Title pt to be  I with inital HEP    Period Weeks    Status Achieved      PT SHORT TERM GOAL #2   Title pt to increase L knee AROM to >/= 10 - 90 degrees for therapuetic progression    Baseline 20 -90 on 7/14    Period Weeks    Status On-going             PT Long Term Goals - 06/24/20 1240      PT LONG TERM GOAL #1   Title Patient to demonstrate functional muscle strength as being MMT 5/5 in order to improve mechanics and reduce pain     Baseline -    Period Weeks    Status On-going      PT LONG TERM GOAL #2   Title Patient to be able to reciprocally ascend/descend full flight of stairs with U railing, reciprocal pattern, good eccenric control in order to improve home/community access     Status On-going      PT LONG TERM GOAL #3   Title pt to be able to walk/ stand >/= 60 min with LRAD for community ambulation and assist with work related tasks.    Period Weeks    Status On-going      PT LONG TERM GOAL #4   Title increase FOTO score to </= 30% limited to demo improvement in function    Period Weeks    Status On-going      PT LONG  TERM GOAL #5   Title pt to be I with all HEP given to maintain and progress current level of function    Period Weeks    Status On-going                 Plan - 07/01/20 1206    Clinical Impression Statement Able to complete full revolutions on bike. She is trying the prone extension hang at home . She has difficulty with SLR from table today. Worked on quad activation and strengthening. VAso for pain and edema at end of session.    PT Next Visit Plan review / update HEP PRN,  , knee mobs/ AAROM, hip / knee strengthening, gait training, vaso for pain/ swelling    PT Home Exercise Plan [id number] - seated and supine hamstring stretch, supine and seated heel slide, quad set with towel under knee, sidelying hip abduction, prone knee hang           Patient will benefit from skilled therapeutic intervention in order to improve the following deficits and impairments:  Improper body mechanics, Decreased strength, Increased muscle spasms, Abnormal gait, Postural dysfunction, Decreased range of motion, Decreased activity tolerance, Decreased balance, Decreased endurance, Pain, Increased edema  Visit Diagnosis: Localized edema  Chronic pain of left knee  Muscle weakness (generalized)  Other abnormalities of gait and mobility     Problem List Patient Active Problem List   Diagnosis Date Noted   Status post total left knee replacement 05/19/2020   Status post total right knee replacement 07/12/2018   Primary osteoarthritis of left knee 04/11/2018   Unilateral primary osteoarthritis, right knee 03/07/2018   Chronic pain of right knee 03/07/2018   Chronic pain of left knee 03/07/2018   Malignant neoplasm of central portion of left female breast ([place]) 04/16/2014    Dorene Ar, PTA 07/01/2020, 12:10 PM  [facility] [address] [place], [place], [address] Phone: [phone]   Fax:  [phone]  Name: Kayla  Dillie Miles MRN: [id number] Date of Birth: [birth year]

## 2020-07-02 ENCOUNTER — Encounter: Payer: Self-pay | Admitting: Physical Therapy

## 2020-07-02 ENCOUNTER — Ambulatory Visit: Payer: No Typology Code available for payment source | Admitting: Physical Therapy

## 2020-07-02 DIAGNOSIS — R2689 Other abnormalities of gait and mobility: Secondary | ICD-10-CM

## 2020-07-02 DIAGNOSIS — M6281 Muscle weakness (generalized): Secondary | ICD-10-CM

## 2020-07-02 DIAGNOSIS — G8929 Other chronic pain: Secondary | ICD-10-CM

## 2020-07-02 DIAGNOSIS — M25562 Pain in left knee: Secondary | ICD-10-CM | POA: Diagnosis not present

## 2020-07-02 DIAGNOSIS — R600 Localized edema: Secondary | ICD-10-CM

## 2020-07-02 NOTE — Therapy (Signed)
[place], [place], [address] Phone: [phone]   Fax:  [phone]  Physical Therapy Treatment  Patient Details  Name: Kayla Miles MRN: [id number] Date of Birth: 06-27-1951 Referring Provider (PT): Jean Rosenthal MD   Encounter Date: 07/02/2020   PT End of Session - 07/02/20 1016    Visit Number 10    Number of Visits 15    Date for PT Re-Evaluation 07/20/20    Authorization Type VA    Authorization - Visit Number 9    Authorization - Number of Visits 15    Progress Note Due on Visit 18    PT Start Time 1016    PT Stop Time 1105    PT Time Calculation (min) 49 min    Activity Tolerance Patient tolerated treatment well    Behavior During Therapy [facility] for tasks assessed/performed           Past Medical History:  Diagnosis Date  . Arthralgia of knee, right   . Arthritis    left knee osteoarthritis   . Breast cancer ([place]) 03/26/14   Left  . HTN (hypertension)   . Hypothyroidism   . Pre-diabetes    05/15/20 - per pt, insisted that she was not diabetic or pre diabetic. pt does not check sugars at home and has never been diagnosed    Past Surgical History:  Procedure Laterality Date  . BREAST IMPLANT EXCHANGE Bilateral 05/03/2018  . MASTECTOMY Bilateral 2015   with reconstruction.   . TONSILLECTOMY    . TOTAL KNEE ARTHROPLASTY Right 05/29/2018  . TOTAL KNEE ARTHROPLASTY Right 05/29/2018   Procedure: RIGHT  TOTAL KNEE ARTHROPLASTY;  Surgeon: [name], MD;  Location: [place];  Service: Orthopedics;  Laterality: Right;  . TOTAL KNEE ARTHROPLASTY Left 05/19/2020   Procedure: LEFT TOTAL KNEE ARTHROPLASTY;  Surgeon: [name], MD;  Location: [place];  Service: Orthopedics;  Laterality: Left;    There were no vitals filed for this visit.   Subjective Assessment - 07/02/20 1021    Subjective " I am feeling sore today, I don't know why I worked out yesterday."     Patient Stated Goals strengthening the knee, move on with life.    Currently in Pain? Yes    Pain Score 2     Pain Location Knee    Pain Orientation Left    Pain Descriptors / Indicators Aching    Pain Type Surgical pain    Aggravating Factors  laying down, straightening the knee    Pain Relieving Factors ice, rest,              [facility] PT Assessment - 07/02/20 0001      Assessment   Medical Diagnosis L TKA    Referring Provider (PT) Jean Rosenthal MD    Onset Date/Surgical Date 05/19/20    Hand Dominance Right      AROM   Left Knee Extension 15    Left Knee Flexion 103                         [facility] Adult PT Treatment/Exercise - 07/02/20 0001      Knee/Hip Exercises: Aerobic   Recumbent Bike L2 x 6 min    3 min backward, 3 min forward     Knee/Hip Exercises: Machines for Strengthening   Cybex Leg Press 1 x 20 bil LE 40#      Knee/Hip  Exercises: Standing   Lateral Step Up 1 set;15 reps;Step Height: 6"    Forward Step Up 1 set;15 reps;Step Height: 6"    Step Down Left;1 set;15 reps;Step Height: 6"    Gait Training gait training 185 ft x 5, using metronome set at 90 BPM, x 1lap, 100 BPM x 3 Laps and 110 x 1 lap   to redcue antalgic gait pattern   Other Standing Knee Exercises Deadlift 2 x 10 with 10# Kettlebell, 1 x from 8 inch step, 1 x from 6 inch step      Knee/Hip Exercises: Prone   Hip Extension Strengthening;Right;1 set;15 reps   hamstring curl into hip extension   Prone Knee Hang 5 minutes;Weights    Prone Knee Hang Weights (lbs) 3#      Vasopneumatic   Number Minutes Vasopneumatic  10 minutes    Vasopnuematic Location  Knee    Vasopneumatic Pressure Medium    Vasopneumatic Temperature  34      Manual Therapy   Joint Mobilization tibiofemiral AP/ PAmobs grade III- IV                  PT Education - 07/02/20 1210    Education Details walking with metronome, using and app that could be downloaded to phone to reduce antalgic  pattern. To reduce use of SPC and  use the [facility] only when she is in unfamiliar areas.    Person(s) Educated Patient    Methods Explanation;Verbal cues    Comprehension Verbalized understanding;Verbal cues required            PT Short Term Goals - 06/24/20 1239      PT SHORT TERM GOAL #1   Title pt to be I with inital HEP    Period Weeks    Status Achieved      PT SHORT TERM GOAL #2   Title pt to increase L knee AROM to >/= 10 - 90 degrees for therapuetic progression    Baseline 20 -90 on 7/14    Period Weeks    Status On-going             PT Long Term Goals - 06/24/20 1240      PT LONG TERM GOAL #1   Title Patient to demonstrate functional muscle strength as being MMT 5/5 in order to improve mechanics and reduce pain     Baseline -    Period Weeks    Status On-going      PT LONG TERM GOAL #2   Title Patient to be able to reciprocally ascend/descend full flight of stairs with U railing, reciprocal pattern, good eccenric control in order to improve home/community access     Status On-going      PT LONG TERM GOAL #3   Title pt to be able to walk/ stand >/= 60 min with LRAD for community ambulation and assist with work related tasks.    Period Weeks    Status On-going      PT LONG TERM GOAL #4   Title increase FOTO score to </= 30% limited to demo improvement in function    Period Weeks    Status On-going      PT LONG TERM GOAL #5   Title pt to be I with all HEP given to maintain and progress current level of function    Period Weeks    Status On-going  Plan - 07/02/20 1206    Clinical Impression Statement pt noted feeling sore from previous session. she is making progress with ROM today at 15 - 103. Worked on knee ROM and prone hang with weight for extension. She did well with strengthening and practiced gait training using metronome to promote equal step/ stride and reduce antalgic pattern which she did well with. continued vaso end of  session for soreness.    PT Treatment/Interventions ADLs/Self Care Home Management;Cryotherapy;Electrical Stimulation;Iontophoresis 4mg /ml Dexamethasone;Moist Heat;Ultrasound;Gait training;Therapeutic exercise;Therapeutic activities;Neuromuscular re-education;Balance training;Passive range of motion;Taping;Vasopneumatic Device;Patient/family education    PT Next Visit Plan review / update HEP PRN,  hip / knee strengthening, knee ROM, gait training, vaso for pain/ swelling    PT Home Exercise Plan [id number] - seated and supine hamstring stretch, supine and seated heel slide, quad set with towel under knee, sidelying hip abduction, prone knee hang, walking with metronoem (app from phone).    Consulted and Agree with Plan of Care Patient           Patient will benefit from skilled therapeutic intervention in order to improve the following deficits and impairments:  Improper body mechanics, Decreased strength, Increased muscle spasms, Abnormal gait, Postural dysfunction, Decreased range of motion, Decreased activity tolerance, Decreased balance, Decreased endurance, Pain, Increased edema  Visit Diagnosis: Localized edema  Chronic pain of left knee  Muscle weakness (generalized)  Other abnormalities of gait and mobility     Problem List Patient Active Problem List   Diagnosis Date Noted  . 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 Status post total right knee replacement 07/12/2018  . Primary osteoarthritis of left knee 04/11/2018  . Unilateral primary osteoarthritis, right knee 03/07/2018  . Chronic pain of right knee 03/07/2018  . Chronic pain of left knee 03/07/2018  . Malignant neoplasm of central portion of left female breast ([place]) 04/16/2014   Starr Lake PT, DPT, LAT, ATC  07/02/20  12:12 PM      [facility] [address] [place], [place], [address] Phone: [phone]   Fax:  [phone]  Name:  Kayla Miles MRN: [id number] Date of Birth: 1951-01-24

## 2020-07-06 ENCOUNTER — Ambulatory Visit: Payer: No Typology Code available for payment source | Admitting: Physical Therapy

## 2020-07-06 ENCOUNTER — Other Ambulatory Visit: Payer: Self-pay

## 2020-07-06 DIAGNOSIS — R2689 Other abnormalities of gait and mobility: Secondary | ICD-10-CM

## 2020-07-06 DIAGNOSIS — G8929 Other chronic pain: Secondary | ICD-10-CM

## 2020-07-06 DIAGNOSIS — M25562 Pain in left knee: Secondary | ICD-10-CM | POA: Diagnosis not present

## 2020-07-06 DIAGNOSIS — M6281 Muscle weakness (generalized): Secondary | ICD-10-CM

## 2020-07-06 DIAGNOSIS — R600 Localized edema: Secondary | ICD-10-CM

## 2020-07-06 NOTE — Therapy (Signed)
[place], [place], [address] Phone: [phone]   Fax:  [phone]  Physical Therapy Treatment  Patient Details  Name: Kayla Miles MRN: [id number] Date of Birth: 06/21/1951 Referring Provider (PT): Jean Rosenthal MD   Encounter Date: 07/06/2020   PT End of Session - 07/06/20 1113    Visit Number 11    Number of Visits 15    Date for PT Re-Evaluation 07/20/20    Authorization Type VA    Authorization - Visit Number 10    Authorization - Number of Visits 15    PT Start Time 4259    PT Stop Time 5638    PT Time Calculation (min) 39 min           Past Medical History:  Diagnosis Date  . Arthralgia of knee, right   . Arthritis    left knee osteoarthritis   . Breast cancer ([place]) 03/26/14   Left  . HTN (hypertension)   . Hypothyroidism   . Pre-diabetes    05/15/20 - per pt, insisted that she was not diabetic or pre diabetic. pt does not check sugars at home and has never been diagnosed    Past Surgical History:  Procedure Laterality Date  . BREAST IMPLANT EXCHANGE Bilateral 05/03/2018  . MASTECTOMY Bilateral 2015   with reconstruction.   . TONSILLECTOMY    . TOTAL KNEE ARTHROPLASTY Right 05/29/2018  . TOTAL KNEE ARTHROPLASTY Right 05/29/2018   Procedure: RIGHT  TOTAL KNEE ARTHROPLASTY;  Surgeon: [name], MD;  Location: [place];  Service: Orthopedics;  Laterality: Right;  . TOTAL KNEE ARTHROPLASTY Left 05/19/2020   Procedure: LEFT TOTAL KNEE ARTHROPLASTY;  Surgeon: [name], MD;  Location: [place];  Service: Orthopedics;  Laterality: Left;    There were no vitals filed for this visit.                      [facility] Adult PT Treatment/Exercise - 07/06/20 0001      Knee/Hip Exercises: Stretches   Active Hamstring Stretch Limitations supine with strap x 3       Knee/Hip Exercises: Machines for Strengthening   Cybex Leg Press 1 x 20 bil  LE 40#      Knee/Hip Exercises: Standing   Lateral Step Up 1 set;15 reps;Step Height: 6"    Forward Step Up 1 set;15 reps;Step Height: 6"    Step Down Left;1 set;15 reps;Step Height: 6"    SLS 13 sec , 16 sec     Other Standing Knee Exercises Deadlift 2 x 10 with 15# Kettlebell  from 8 inch step,       Knee/Hip Exercises: Seated   Long Arc Quad 2 sets;15 reps;Left    Long Arc Quad Weight 5 lbs.      Knee/Hip Exercises: Supine   Quad Sets 10 reps    Terminal Knee Extension 20 reps    Theraband Level (Terminal Knee Extension) Level 3 (Green)    Straight Leg Raises 15 reps    Straight Leg Raises Limitations with initial quad set       Knee/Hip Exercises: Prone   Hip Extension Strengthening;Right;1 set;10 reps   hamstring curl into hip extension   Prone Knee Hang 3 minutes;Weights    Prone Knee Hang Weights (lbs) 3#       Vasopneumatic   Number Minutes Vasopneumatic  --   declined    Vasopnuematic Location  --  Vasopneumatic Pressure --    Vasopneumatic Temperature  --                    PT Short Term Goals - 06/24/20 1239      PT SHORT TERM GOAL #1   Title pt to be I with inital HEP    Period Weeks    Status Achieved      PT SHORT TERM GOAL #2   Title pt to increase L knee AROM to >/= 10 - 90 degrees for therapuetic progression    Baseline 20 -90 on 7/14    Period Weeks    Status On-going             PT Long Term Goals - 06/24/20 1240      PT LONG TERM GOAL #1   Title Patient to demonstrate functional muscle strength as being MMT 5/5 in order to improve mechanics and reduce pain     Baseline -    Period Weeks    Status On-going      PT LONG TERM GOAL #2   Title Patient to be able to reciprocally ascend/descend full flight of stairs with U railing, reciprocal pattern, good eccenric control in order to improve home/community access     Status On-going      PT LONG TERM GOAL #3   Title pt to be able to walk/ stand >/= 60 min with LRAD for  community ambulation and assist with work related tasks.    Period Weeks    Status On-going      PT LONG TERM GOAL #4   Title increase FOTO score to </= 30% limited to demo improvement in function    Period Weeks    Status On-going      PT LONG TERM GOAL #5   Title pt to be I with all HEP given to maintain and progress current level of function    Period Weeks    Status On-going                 Plan - 07/06/20 1144    Clinical Impression Statement Pt reports metronome gait training was helpful last session. She arrives without SPC. Her AROM is unchanged. Continued with quad activation and knee extension stretching. She felt better after session and declined vaso.    PT Next Visit Plan review / update HEP PRN,  hip / knee strengthening, knee ROM, gait training, vaso for pain/ swelling    PT Home Exercise Plan [id number] - seated and supine hamstring stretch, supine and seated heel slide, quad set with towel under knee, sidelying hip abduction, prone knee hang, walking with metronome (app from phone).    Consulted and Agree with Plan of Care Patient           Patient will benefit from skilled therapeutic intervention in order to improve the following deficits and impairments:  Improper body mechanics, Decreased strength, Increased muscle spasms, Abnormal gait, Postural dysfunction, Decreased range of motion, Decreased activity tolerance, Decreased balance, Decreased endurance, Pain, Increased edema  Visit Diagnosis: Localized edema  Chronic pain of left knee  Muscle weakness (generalized)  Other abnormalities of gait and mobility     Problem List Patient Active Problem List   Diagnosis Date Noted  . 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 Status post total right knee replacement 07/12/2018  . Primary osteoarthritis of left knee 04/11/2018  . Unilateral primary osteoarthritis, right knee 03/07/2018  . Chronic  pain of right knee 03/07/2018  . Chronic  pain of left knee 03/07/2018  . Malignant neoplasm of central portion of left female breast ([place]) 04/16/2014    Dorene Ar, PTA 07/06/2020, 12:16 PM  [place], [place], [address] Phone: [phone]   Fax:  [phone]  Name: Kayla Miles MRN: [id number] Date of Birth: 06-09-1951

## 2020-07-08 ENCOUNTER — Other Ambulatory Visit: Payer: Self-pay

## 2020-07-08 ENCOUNTER — Encounter: Payer: Self-pay | Admitting: Physical Therapy

## 2020-07-08 ENCOUNTER — Ambulatory Visit: Payer: No Typology Code available for payment source | Admitting: Physical Therapy

## 2020-07-08 DIAGNOSIS — M6281 Muscle weakness (generalized): Secondary | ICD-10-CM

## 2020-07-08 DIAGNOSIS — M25562 Pain in left knee: Secondary | ICD-10-CM | POA: Diagnosis not present

## 2020-07-08 DIAGNOSIS — R600 Localized edema: Secondary | ICD-10-CM

## 2020-07-08 DIAGNOSIS — M25561 Pain in right knee: Secondary | ICD-10-CM

## 2020-07-08 DIAGNOSIS — M25661 Stiffness of right knee, not elsewhere classified: Secondary | ICD-10-CM

## 2020-07-08 DIAGNOSIS — R2689 Other abnormalities of gait and mobility: Secondary | ICD-10-CM

## 2020-07-08 NOTE — Therapy (Signed)
[place], [place], [address] Phone: [phone]   Fax:  [phone]  Physical Therapy Treatment  Patient Details  Name: Kayla Miles MRN: [id number] Date of Birth: 1950-12-30 Referring Provider (PT): Jean Rosenthal MD   Encounter Date: 07/08/2020   PT End of Session - 07/08/20 1030    Visit Number 12    Number of Visits 15    Date for PT Re-Evaluation 07/20/20    Authorization Type [facility]    Authorization - Visit Number 11    Authorization - Number of Visits 15    PT Start Time 1026   10 minutes late   PT Stop Time 1115    PT Time Calculation (min) 49 min           Past Medical History:  Diagnosis Date  . Arthralgia of knee, right   . Arthritis    left knee osteoarthritis   . Breast cancer ([place]) 03/26/14   Left  . HTN (hypertension)   . Hypothyroidism   . Pre-diabetes    05/15/20 - per pt, insisted that she was not diabetic or pre diabetic. pt does not check sugars at home and has never been diagnosed    Past Surgical History:  Procedure Laterality Date  . BREAST IMPLANT EXCHANGE Bilateral 05/03/2018  . MASTECTOMY Bilateral 2015   with reconstruction.   . TONSILLECTOMY    . TOTAL KNEE ARTHROPLASTY Right 05/29/2018  . TOTAL KNEE ARTHROPLASTY Right 05/29/2018   Procedure: RIGHT  TOTAL KNEE ARTHROPLASTY;  Surgeon: [name], MD;  Location: [facility];  Service: Orthopedics;  Laterality: Right;  . TOTAL KNEE ARTHROPLASTY Left 05/19/2020   Procedure: LEFT TOTAL KNEE ARTHROPLASTY;  Surgeon: [name], MD;  Location: [place] [facility];  Service: Orthopedics;  Laterality: Left;    There were no vitals filed for this visit.   Subjective Assessment - 07/08/20 1028    Subjective My knee is hurting today. 5/10, I used ice this mornin and it helped a little.    Currently in Pain? Yes    Pain Score 5     Pain Location Knee    Pain Orientation Left    Pain Descriptors /  Indicators Aching    Pain Type Surgical pain    Aggravating Factors  laying down, extending    Pain Relieving Factors ice, rest                             [facility] Adult PT Treatment/Exercise - 07/08/20 0001      Knee/Hip Exercises: Aerobic   Nustep L 5 x 5 minutes       Knee/Hip Exercises: Machines for Strengthening   Cybex Knee Extension 5#  single eccentrics    Cybex Knee Flexion 15#  single eccentrics      Knee/Hip Exercises: Standing   Forward Step Up 1 set;15 reps;Step Height: 8"    SLS 5 sec best on Left     SLS with Vectors proprioception on LLE with opposite hip abuction and knee flexion- needs UE support       Vasopneumatic   Number Minutes Vasopneumatic  15 minutes    Vasopnuematic Location  Knee    Vasopneumatic Pressure Medium    Vasopneumatic Temperature  34                    PT Short Term Goals - 06/24/20 1239  PT SHORT TERM GOAL #1   Title pt to be I with inital HEP    Period Weeks    Status Achieved      PT SHORT TERM GOAL #2   Title pt to increase L knee AROM to >/= 10 - 90 degrees for therapuetic progression    Baseline 20 -90 on 7/14    Period Weeks    Status On-going             PT Long Term Goals - 06/24/20 1240      PT LONG TERM GOAL #1   Title Patient to demonstrate functional muscle strength as being MMT 5/5 in order to improve mechanics and reduce pain     Baseline -    Period Weeks    Status On-going      PT LONG TERM GOAL #2   Title Patient to be able to reciprocally ascend/descend full flight of stairs with U railing, reciprocal pattern, good eccenric control in order to improve home/community access     Status On-going      PT LONG TERM GOAL #3   Title pt to be able to walk/ stand >/= 60 min with LRAD for community ambulation and assist with work related tasks.    Period Weeks    Status On-going      PT LONG TERM GOAL #4   Title increase FOTO score to </= 30% limited to demo improvement in  function    Period Weeks    Status On-going      PT LONG TERM GOAL #5   Title pt to be I with all HEP given to maintain and progress current level of function    Period Weeks    Status On-going                 Plan - 07/08/20 1140    Clinical Impression Statement Pt arrives reporting increased pain and demonstrates increased antalgic gait pattern. Increased proprioception training today to decrease apprehesnsion with WB to LLE. Encouraged her to work on SLS at home. Vaso at end of session to decrease edema.    PT Next Visit Plan review / update HEP PRN,  hip / knee strengthening, knee ROM, gait training, vaso for pain/ swelling-considert KT tape / retro massage    PT Home Exercise Plan [id number] - seated and supine hamstring stretch, supine and seated heel slide, quad set with towel under knee, sidelying hip abduction, prone knee hang, walking with metronome (app from phone).           Patient will benefit from skilled therapeutic intervention in order to improve the following deficits and impairments:  Improper body mechanics, Decreased strength, Increased muscle spasms, Abnormal gait, Postural dysfunction, Decreased range of motion, Decreased activity tolerance, Decreased balance, Decreased endurance, Pain, Increased edema  Visit Diagnosis: Localized edema  Chronic pain of left knee  Muscle weakness (generalized)  Other abnormalities of gait and mobility  Acute pain of right knee  Stiffness of right knee, not elsewhere classified     Problem List Patient Active Problem List   Diagnosis Date Noted  . 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 Status post total right knee replacement 07/12/2018  . Primary osteoarthritis of left knee 04/11/2018  . Unilateral primary osteoarthritis, right knee 03/07/2018  . Chronic pain of right knee 03/07/2018  . Chronic pain of left knee 03/07/2018  . Malignant neoplasm of central portion of left female breast ([place])  04/16/2014  Dorene Ar, [place] 07/08/2020, 11:43 AM  [place], [place], [address] Phone: [phone]   Fax:  [phone]  Name: Kayla Miles MRN: [id number] Date of Birth: [birth year]

## 2020-07-13 ENCOUNTER — Other Ambulatory Visit: Payer: Self-pay

## 2020-07-13 ENCOUNTER — Ambulatory Visit: Payer: No Typology Code available for payment source | Attending: Orthopaedic Surgery | Admitting: Physical Therapy

## 2020-07-13 DIAGNOSIS — M6281 Muscle weakness (generalized): Secondary | ICD-10-CM | POA: Diagnosis present

## 2020-07-13 DIAGNOSIS — M25562 Pain in left knee: Secondary | ICD-10-CM | POA: Diagnosis present

## 2020-07-13 DIAGNOSIS — R600 Localized edema: Secondary | ICD-10-CM | POA: Diagnosis present

## 2020-07-13 DIAGNOSIS — R2689 Other abnormalities of gait and mobility: Secondary | ICD-10-CM | POA: Insufficient documentation

## 2020-07-13 DIAGNOSIS — M25661 Stiffness of right knee, not elsewhere classified: Secondary | ICD-10-CM | POA: Diagnosis present

## 2020-07-13 DIAGNOSIS — M25561 Pain in right knee: Secondary | ICD-10-CM | POA: Insufficient documentation

## 2020-07-13 DIAGNOSIS — G8929 Other chronic pain: Secondary | ICD-10-CM | POA: Diagnosis present

## 2020-07-13 NOTE — Therapy (Signed)
[place], [place], [address] Phone: [phone]   Fax:  [phone]  Physical Therapy Treatment  Patient Details  Name: Kayla Miles MRN: [id number] Date of Birth: 1951-09-06 Referring Provider (PT): Jean Rosenthal MD   Encounter Date: 07/13/2020   PT End of Session - 07/13/20 1146    Visit Number 13    Number of Visits 15    Date for PT Re-Evaluation 07/20/20    Authorization Type [facility]    Authorization - Visit Number 12    Authorization - Number of Visits 15    Progress Note Due on Visit 18    PT Start Time 1108    PT Stop Time 1200    PT Time Calculation (min) 52 min           Past Medical History:  Diagnosis Date  . Arthralgia of knee, right   . Arthritis    left knee osteoarthritis   . Breast cancer ([place]) 03/26/14   Left  . HTN (hypertension)   . Hypothyroidism   . Pre-diabetes    05/15/20 - per pt, insisted that she was not diabetic or pre diabetic. pt does not check sugars at home and has never been diagnosed    Past Surgical History:  Procedure Laterality Date  . BREAST IMPLANT EXCHANGE Bilateral 05/03/2018  . MASTECTOMY Bilateral 2015   with reconstruction.   . TONSILLECTOMY    . TOTAL KNEE ARTHROPLASTY Right 05/29/2018  . TOTAL KNEE ARTHROPLASTY Right 05/29/2018   Procedure: RIGHT  TOTAL KNEE ARTHROPLASTY;  Surgeon: [name], MD;  Location: [facility];  Service: Orthopedics;  Laterality: Right;  . TOTAL KNEE ARTHROPLASTY Left 05/19/2020   Procedure: LEFT TOTAL KNEE ARTHROPLASTY;  Surgeon: [name], MD;  Location: [facility] [facility];  Service: Orthopedics;  Laterality: Left;    There were no vitals filed for this visit.                      [facility] Adult PT Treatment/Exercise - 07/13/20 0001      Knee/Hip Exercises: Stretches   Other Knee/Hip Stretches slant baord       Knee/Hip Exercises: Aerobic   Recumbent Bike full revolutions      Nustep L5 5 minutes       Knee/Hip Exercises: Standing   Lateral Step Up 20 reps;Hand Hold: 1;Step Height: 6"    Forward Step Up 2 sets;20 reps;Step Height: 6"    Step Down 2 sets;10 reps;Hand Hold: 1;Step Height: 2"    Functional Squat 20 reps    SLS on airiex, needs UE       Modalities   Modalities Electrical Stimulation      Electrical Stimulation   Electrical Stimulation Location medial left knee    Electrical Stimulation Action IFC    Electrical Stimulation Parameters 80mA    Electrical Stimulation Goals Pain      Vasopneumatic   Number Minutes Vasopneumatic  15 minutes    Vasopnuematic Location  Knee    Vasopneumatic Pressure Low    Vasopneumatic Temperature  34                    PT Short Term Goals - 06/24/20 1239      PT SHORT TERM GOAL #1   Title pt to be I with inital HEP    Period Weeks    Status Achieved      PT SHORT TERM GOAL #  2   Title pt to increase L knee AROM to >/= 10 - 90 degrees for therapuetic progression    Baseline 20 -90 on 7/14    Period Weeks    Status On-going             PT Long Term Goals - 06/24/20 1240      PT LONG TERM GOAL #1   Title Patient to demonstrate functional muscle strength as being MMT 5/5 in order to improve mechanics and reduce pain     Baseline -    Period Weeks    Status On-going      PT LONG TERM GOAL #2   Title Patient to be able to reciprocally ascend/descend full flight of stairs with U railing, reciprocal pattern, good eccenric control in order to improve home/community access     Status On-going      PT LONG TERM GOAL #3   Title pt to be able to walk/ stand >/= 60 min with LRAD for community ambulation and assist with work related tasks.    Period Weeks    Status On-going      PT LONG TERM GOAL #4   Title increase FOTO score to </= 30% limited to demo improvement in function    Period Weeks    Status On-going      PT LONG TERM GOAL #5   Title pt to be I with all HEP given to maintain  and progress current level of function    Period Weeks    Status On-going                 Plan - 07/13/20 1217    Clinical Impression Statement Pt arrives with decreased antalgic pattern and c/o continued medial knee pain with ambulation and transitions. Continued closed chain strength. Trial of Estim to medial knee to decrease pain. Will assess next session.    PT Next Visit Plan review / update HEP PRN,  hip / knee strengthening, knee ROM, gait training, vaso for pain/ swelling-considert KT tape / retro massage; assess response to Estim    PT Home Exercise Plan [id number] - seated and supine hamstring stretch, supine and seated heel slide, quad set with towel under knee, sidelying hip abduction, prone knee hang, walking with metronome (app from phone).           Patient will benefit from skilled therapeutic intervention in order to improve the following deficits and impairments:  Improper body mechanics, Decreased strength, Increased muscle spasms, Abnormal gait, Postural dysfunction, Decreased range of motion, Decreased activity tolerance, Decreased balance, Decreased endurance, Pain, Increased edema  Visit Diagnosis: Localized edema  Chronic pain of left knee  Muscle weakness (generalized)     Problem List Patient Active Problem List   Diagnosis Date Noted  . 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 Status post total right knee replacement 07/12/2018  . Primary osteoarthritis of left knee 04/11/2018  . Unilateral primary osteoarthritis, right knee 03/07/2018  . Chronic pain of right knee 03/07/2018  . Chronic pain of left knee 03/07/2018  . Malignant neoplasm of central portion of left female breast [facility]) 04/16/2014    Dorene Ar, PTA 07/13/2020, 12:47 PM  [facility] [address] [place], [place], [address] Phone: [phone]   Fax:  [phone]  Name: Kayla Miles MRN:  [id number] Date of Birth: [birth year]

## 2020-07-15 ENCOUNTER — Ambulatory Visit: Payer: No Typology Code available for payment source | Admitting: Physical Therapy

## 2020-07-15 ENCOUNTER — Encounter: Payer: Self-pay | Admitting: Physical Therapy

## 2020-07-15 ENCOUNTER — Other Ambulatory Visit: Payer: Self-pay

## 2020-07-15 DIAGNOSIS — M6281 Muscle weakness (generalized): Secondary | ICD-10-CM

## 2020-07-15 DIAGNOSIS — R600 Localized edema: Secondary | ICD-10-CM

## 2020-07-15 DIAGNOSIS — R2689 Other abnormalities of gait and mobility: Secondary | ICD-10-CM

## 2020-07-15 DIAGNOSIS — M25562 Pain in left knee: Secondary | ICD-10-CM

## 2020-07-15 NOTE — Therapy (Signed)
[place], [place], [address] Phone: [phone]   Fax:  [phone]  Physical Therapy Treatment / Recertification Progress Note Reporting Period 06/29/2020  to 07/15/2020  See note below for Objective Data and Assessment of Progress/Goals.       Patient Details  Name: Kayla Miles MRN: [id number] Date of Birth: 1951/05/15 Referring Provider (PT): Jean Rosenthal MD   Encounter Date: 07/15/2020   PT End of Session - 07/15/20 1247    Visit Number 14    Number of Visits 23    Date for PT Re-Evaluation 08/19/20    Authorization Type VA    Authorization - Visit Number 13    Authorization - Number of Visits 15    Progress Note Due on Visit 24    PT Start Time 1152   pt arrived 7 min late   PT Stop Time 1245    PT Time Calculation (min) 53 min    Activity Tolerance Patient tolerated treatment well    Behavior During Therapy [facility] for tasks assessed/performed           Past Medical History:  Diagnosis Date  . Arthralgia of knee, right   . Arthritis    left knee osteoarthritis   . Breast cancer ([place]) 03/26/14   Left  . HTN (hypertension)   . Hypothyroidism   . Pre-diabetes    05/15/20 - per pt, insisted that she was not diabetic or pre diabetic. pt does not check sugars at home and has never been diagnosed    Past Surgical History:  Procedure Laterality Date  . BREAST IMPLANT EXCHANGE Bilateral 05/03/2018  . MASTECTOMY Bilateral 2015   with reconstruction.   . TONSILLECTOMY    . TOTAL KNEE ARTHROPLASTY Right 05/29/2018  . TOTAL KNEE ARTHROPLASTY Right 05/29/2018   Procedure: RIGHT  TOTAL KNEE ARTHROPLASTY;  Surgeon: [name], MD;  Location: [place];  Service: Orthopedics;  Laterality: Right;  . TOTAL KNEE ARTHROPLASTY Left 05/19/2020   Procedure: LEFT TOTAL KNEE ARTHROPLASTY;  Surgeon: [name], MD;  Location: [place];  Service: Orthopedics;  Laterality:  Left;    There were no vitals filed for this visit.   Subjective Assessment - 07/15/20 1157    Subjective " I am feeling more stiffness in the knee. some soreness in the back of the knee with pulling the leg back"    Currently in Pain? No/denies    Aggravating Factors  bending the knee              [facility] PT Assessment - 07/15/20 0001      Assessment   Medical Diagnosis L TKA    Referring Provider (PT) Jean Rosenthal MD    Onset Date/Surgical Date 05/19/20    Hand Dominance Right      Observation/Other Assessments   Focus on Therapeutic Outcomes (FOTO)  51% limited      AROM   Left Knee Extension 15    Left Knee Flexion 102      Strength   Left Knee Flexion 3+/5   in available ROM   Left Knee Extension 3+/5   in available ROM                        [facility] Adult PT Treatment/Exercise - 07/15/20 0001      Knee/Hip Exercises: Stretches   Other Knee/Hip Stretches slant baord    gastroc/ soleus x 2 ea.  Knee/Hip Exercises: Aerobic   Recumbent Bike L1 x 6 min full revolutions      Knee/Hip Exercises: Standing   Terminal Knee Extension Strengthening;Left;2 sets;10 reps   pushign back of the knee into ball on the wall   Forward Step Up 2 sets;Step Height: 6";20 reps    Functional Squat 20 reps   with bil HHA from free motion     Knee/Hip Exercises: Supine   Bridges Strengthening;Both;2 sets;20 reps   with knee flexed to max end range     Vasopneumatic   Number Minutes Vasopneumatic  10 minutes    Vasopnuematic Location  Knee    Vasopneumatic Pressure Medium      Manual Therapy   Manual Therapy Taping    Joint Mobilization tibiofemiral AP/ PAmobs grade III- IV, tibial ER with quad set to promote extension    Kinesiotex Create Space;Edema      Kinesiotix   Edema L knee                  PT Education - 07/15/20 1242    Education Details benefits of KT tape for edema reduction    Person(s) Educated Patient    Methods  Explanation;Verbal cues    Comprehension Verbalized understanding;Verbal cues required            PT Short Term Goals - 07/15/20 1207      PT SHORT TERM GOAL #1   Title pt to be I with inital HEP    Period Weeks    Status Achieved      PT SHORT TERM GOAL #2   Title pt to increase L knee AROM to >/= 10 - 90 degrees for therapuetic progression    Baseline 15 - 102 on 8/4    Period Weeks    Status Partially Met             PT Long Term Goals - 07/15/20 1208      PT LONG TERM GOAL #1   Title Patient to demonstrate functional muscle strength as being MMT 5/5 in order to improve mechanics and reduce pain     Period Weeks    Status On-going      PT LONG TERM GOAL #2   Title Patient to be able to reciprocally ascend/descend full flight of stairs with U railing, reciprocal pattern, good eccenric control in order to improve home/community access     Period Weeks    Status On-going      PT LONG TERM GOAL #3   Title pt to be able to walk/ stand >/= 60 min with LRAD for community ambulation and assist with work related tasks.    Period Weeks    Status On-going      PT LONG TERM GOAL #4   Title increase FOTO score to </= 30% limited to demo improvement in function    Period Weeks    Status On-going      PT LONG TERM GOAL #5   Title pt to be I with all HEP given to maintain and progress current level of function    Period Weeks    Status On-going                 Plan - 07/15/20 1243    Clinical Impression Statement pt continues to make progress with physical therapy with no report of pain but pt does continue to report stiffness int he knee and limited ROM measuring today at 15 - 102 with  end range stiffness. continued knee mobs to promote ROM for both flexion/ extension and LE strengthening, trialed KT tape to facilitate edema reduction. She would benefit from continued physical therapy to decrease L knee stiffness, increase ROM, increase strength and maximize function  by addressing the deficits listed.    PT Frequency 2x / week    PT Duration 4 weeks    PT Treatment/Interventions ADLs/Self Care Home Management;Cryotherapy;Electrical Stimulation;Iontophoresis 47m/ml Dexamethasone;Moist Heat;Ultrasound;Gait training;Therapeutic exercise;Therapeutic activities;Neuromuscular re-education;Balance training;Passive range of motion;Taping;Vasopneumatic Device;Patient/family education    PT Next Visit Plan review / update HEP PRN,  hip / knee strengthening, knee ROM, gait training, vaso for pain/ swelling-considert KT tape / retro massage; assess response to Estim    PT Home Exercise Plan [id number] - seated and supine hamstring stretch, supine and seated heel slide, quad set with towel under knee, sidelying hip abduction, prone knee hang, walking with metronome (app from phone).    Consulted and Agree with Plan of Care Patient           Patient will benefit from skilled therapeutic intervention in order to improve the following deficits and impairments:  Improper body mechanics, Decreased strength, Increased muscle spasms, Abnormal gait, Postural dysfunction, Decreased range of motion, Decreased activity tolerance, Decreased balance, Decreased endurance, Pain, Increased edema  Visit Diagnosis: Localized edema  Chronic pain of left knee  Muscle weakness (generalized)  Other abnormalities of gait and mobility     Problem List Patient Active Problem List   Diagnosis Date Noted  . 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 Status post total right knee replacement 07/12/2018  . Primary osteoarthritis of left knee 04/11/2018  . Unilateral primary osteoarthritis, right knee 03/07/2018  . Chronic pain of right knee 03/07/2018  . Chronic pain of left knee 03/07/2018  . Malignant neoplasm of central portion of left female breast ([place] 04/16/2014    KStarr LakePT, DPT, LAT, ATC  07/15/20  12:50 PM      [address] Health Services[address]GMount Holly NAl[place] [address]Phone: [phone]  Fax:  [phone] Name: MNayzeth AltmanMRN: [id number] of Birth: [birth year]

## 2020-07-22 ENCOUNTER — Other Ambulatory Visit: Payer: Self-pay

## 2020-07-22 ENCOUNTER — Encounter: Payer: Self-pay | Admitting: Physical Therapy

## 2020-07-22 ENCOUNTER — Ambulatory Visit: Payer: No Typology Code available for payment source | Admitting: Physical Therapy

## 2020-07-22 DIAGNOSIS — M25561 Pain in right knee: Secondary | ICD-10-CM

## 2020-07-22 DIAGNOSIS — R600 Localized edema: Secondary | ICD-10-CM

## 2020-07-22 DIAGNOSIS — M25661 Stiffness of right knee, not elsewhere classified: Secondary | ICD-10-CM

## 2020-07-22 DIAGNOSIS — M25562 Pain in left knee: Secondary | ICD-10-CM

## 2020-07-22 DIAGNOSIS — R2689 Other abnormalities of gait and mobility: Secondary | ICD-10-CM

## 2020-07-22 DIAGNOSIS — M6281 Muscle weakness (generalized): Secondary | ICD-10-CM

## 2020-07-22 NOTE — Therapy (Signed)
[place], [place], [address] Phone: [phone]   Fax:  [phone]  Physical Therapy Treatment  Patient Details  Name: Kayla Miles MRN: [id number] Date of Birth: 11-03-1951 Referring Provider (PT): Jean Rosenthal MD   Encounter Date: 07/22/2020   PT End of Session - 07/22/20 1111    Visit Number 15    Number of Visits 23    Date for PT Re-Evaluation 08/19/20    Authorization Type [facility]    Authorization - Visit Number 14    Authorization - Number of Visits 15    PT Start Time 1107    PT Stop Time 2671    PT Time Calculation (min) 58 min           Past Medical History:  Diagnosis Date  . Arthralgia of knee, right   . Arthritis    left knee osteoarthritis   . Breast cancer ([place]) 03/26/14   Left  . HTN (hypertension)   . Hypothyroidism   . Pre-diabetes    05/15/20 - per pt, insisted that she was not diabetic or pre diabetic. pt does not check sugars at home and has never been diagnosed    Past Surgical History:  Procedure Laterality Date  . BREAST IMPLANT EXCHANGE Bilateral 05/03/2018  . MASTECTOMY Bilateral 2015   with reconstruction.   . TONSILLECTOMY    . TOTAL KNEE ARTHROPLASTY Right 05/29/2018  . TOTAL KNEE ARTHROPLASTY Right 05/29/2018   Procedure: RIGHT  TOTAL KNEE ARTHROPLASTY;  Surgeon: [name], MD;  Location: [facility];  Service: Orthopedics;  Laterality: Right;  . TOTAL KNEE ARTHROPLASTY Left 05/19/2020   Procedure: LEFT TOTAL KNEE ARTHROPLASTY;  Surgeon: [name], MD;  Location: [facility];  Service: Orthopedics;  Laterality: Left;    There were no vitals filed for this visit.   Subjective Assessment - 07/22/20 1109    Subjective The tape lasted one day before it rolled off.    Currently in Pain? Yes    Pain Score 6     Pain Location Knee    Pain Orientation Left    Pain Descriptors / Indicators Aching    Pain Type Chronic pain;Surgical  pain    Aggravating Factors  sleeping , end range, activity    Pain Relieving Factors ice, rest                             [facility] Adult PT Treatment/Exercise - 07/22/20 0001      Knee/Hip Exercises: Stretches   Sports administrator Limitations prone with strap     Knee: Self-Stretch Limitations using standard chair for knee flexion step stretch     Other Knee/Hip Stretches slant baord       Knee/Hip Exercises: Aerobic   Recumbent Bike L1 x 6 min full revolutions      Knee/Hip Exercises: Machines for Strengthening   Cybex Knee Extension 5#    Cybex Knee Flexion 15#       Knee/Hip Exercises: Standing   Terminal Knee Extension Strengthening;Left;2 sets;10 reps   pushign back of the knee into ball on the wall   Forward Step Up 2 sets;Step Height: 6";20 reps    Functional Squat 20 reps   with bil HHA from free motion   SLS 8 sec on airex 10 sec on floor  PT Short Term Goals - 07/15/20 1207      PT SHORT TERM GOAL #1   Title pt to be I with inital HEP    Period Weeks    Status Achieved      PT SHORT TERM GOAL #2   Title pt to increase L knee AROM to >/= 10 - 90 degrees for therapuetic progression    Baseline 15 - 102 on 8/4    Period Weeks    Status Partially Met             PT Long Term Goals - 07/15/20 1208      PT LONG TERM GOAL #1   Title Patient to demonstrate functional muscle strength as being MMT 5/5 in order to improve mechanics and reduce pain     Period Weeks    Status On-going      PT LONG TERM GOAL #2   Title Patient to be able to reciprocally ascend/descend full flight of stairs with U railing, reciprocal pattern, good eccenric control in order to improve home/community access     Period Weeks    Status On-going      PT LONG TERM GOAL #3   Title pt to be able to walk/ stand >/= 60 min with LRAD for community ambulation and assist with work related tasks.    Period Weeks    Status On-going      PT LONG TERM  GOAL #4   Title increase FOTO score to </= 30% limited to demo improvement in function    Period Weeks    Status On-going      PT LONG TERM GOAL #5   Title pt to be I with all HEP given to maintain and progress current level of function    Period Weeks    Status On-going                 Plan - 07/22/20 1213    Clinical Impression Statement Pt arrives reporting the KT tape came off within one day, no significant improvement noted. Continued with strengthening and ROM. Vaso at end of session to decrease edema.    PT Next Visit Plan check for VA approval ; review / update HEP PRN,  hip / knee strengthening, knee ROM, gait training, vaso for pain/ swelling-considert KT tape / retro massage; assess response to Estim    PT Home Exercise Plan [id number] - seated and supine hamstring stretch, supine and seated heel slide, quad set with towel under knee, sidelying hip abduction, prone knee hang, walking with metronome (app from phone).           Patient will benefit from skilled therapeutic intervention in order to improve the following deficits and impairments:  Improper body mechanics, Decreased strength, Increased muscle spasms, Abnormal gait, Postural dysfunction, Decreased range of motion, Decreased activity tolerance, Decreased balance, Decreased endurance, Pain, Increased edema  Visit Diagnosis: Localized edema  Chronic pain of left knee  Muscle weakness (generalized)  Other abnormalities of gait and mobility  Acute pain of right knee  Stiffness of right knee, not elsewhere classified     Problem List Patient Active Problem List   Diagnosis Date Noted  . 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 Status post total right knee replacement 07/12/2018  . Primary osteoarthritis of left knee 04/11/2018  . Unilateral primary osteoarthritis, right knee 03/07/2018  . Chronic pain of right knee 03/07/2018  . Chronic pain of left knee 03/07/2018  .  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

## 2020-07-24 ENCOUNTER — Other Ambulatory Visit: Payer: Self-pay

## 2020-07-24 ENCOUNTER — Encounter: Payer: Self-pay | Admitting: Physical Therapy

## 2020-07-24 ENCOUNTER — Ambulatory Visit: Payer: No Typology Code available for payment source | Admitting: Physical Therapy

## 2020-07-24 DIAGNOSIS — G8929 Other chronic pain: Secondary | ICD-10-CM

## 2020-07-24 DIAGNOSIS — M25561 Pain in right knee: Secondary | ICD-10-CM

## 2020-07-24 DIAGNOSIS — R600 Localized edema: Secondary | ICD-10-CM

## 2020-07-24 DIAGNOSIS — M25562 Pain in left knee: Secondary | ICD-10-CM

## 2020-07-24 DIAGNOSIS — M6281 Muscle weakness (generalized): Secondary | ICD-10-CM

## 2020-07-24 DIAGNOSIS — M25661 Stiffness of right knee, not elsewhere classified: Secondary | ICD-10-CM

## 2020-07-24 DIAGNOSIS — R2689 Other abnormalities of gait and mobility: Secondary | ICD-10-CM

## 2020-07-24 NOTE — Therapy (Signed)
[place], [place], [address] Phone: [phone]   Fax:  [phone]  Physical Therapy Treatment  Patient Details  Name: Kayla Miles MRN: [id number] Date of Birth: 1951-02-22 Referring Provider (PT): Jean Rosenthal MD   Encounter Date: 07/24/2020   PT End of Session - 07/24/20 0941    Visit Number 16    Number of Visits 23    Date for PT Re-Evaluation 08/19/20    Authorization Type VA    Authorization - Visit Number 15    Authorization - Number of Visits 15    Progress Note Due on Visit 24    PT Start Time 0930    PT Stop Time 1030    PT Time Calculation (min) 60 min           Past Medical History:  Diagnosis Date  . Arthralgia of knee, right   . Arthritis    left knee osteoarthritis   . Breast cancer ([facility]) 03/26/14   Left  . HTN (hypertension)   . Hypothyroidism   . Pre-diabetes    05/15/20 - per pt, insisted that she was not diabetic or pre diabetic. pt does not check sugars at home and has never been diagnosed    Past Surgical History:  Procedure Laterality Date  . BREAST IMPLANT EXCHANGE Bilateral 05/03/2018  . MASTECTOMY Bilateral 2015   with reconstruction.   . TONSILLECTOMY    . TOTAL KNEE ARTHROPLASTY Right 05/29/2018  . TOTAL KNEE ARTHROPLASTY Right 05/29/2018   Procedure: RIGHT  TOTAL KNEE ARTHROPLASTY;  Surgeon: [name], MD;  Location: [facility];  Service: Orthopedics;  Laterality: Right;  . TOTAL KNEE ARTHROPLASTY Left 05/19/2020   Procedure: LEFT TOTAL KNEE ARTHROPLASTY;  Surgeon: [name], MD;  Location: [facility];  Service: Orthopedics;  Laterality: Left;    There were no vitals filed for this visit.   Subjective Assessment - 07/24/20 0940    Subjective Pain is a 3/10.              [facility] PT Assessment - 07/24/20 0001      AROM   Left Knee Extension 15    Left Knee Flexion 110                         [facility]  Adult PT Treatment/Exercise - 07/24/20 0001      Knee/Hip Exercises: Stretches   Sports administrator Limitations prone with strap     Knee: Self-Stretch Limitations using standard chair for knee flexion step stretch     Other Knee/Hip Stretches slant baord       Knee/Hip Exercises: Aerobic   Recumbent Bike L1 x 5 min       Knee/Hip Exercises: Machines for Strengthening   Cybex Knee Extension 5#    Cybex Knee Flexion 20# single      Knee/Hip Exercises: Standing   Terminal Knee Extension Strengthening;Left;2 sets;10 reps   pushign back of the knee into ball on the wall   Forward Step Up 2 sets;10 reps;Step Height: 8"    Functional Squat 20 reps   with bil HHA from free motion   SLS 9 sec on oval foam, 12 sec on floor     SLS with Vectors SLS on foam with right hip abduction - needs UE touch       Knee/Hip Exercises: Supine   Quad Sets 20 reps    Straight Leg Raises 20  reps                    PT Short Term Goals - 07/15/20 1207      PT SHORT TERM GOAL #1   Title pt to be I with inital HEP    Period Weeks    Status Achieved      PT SHORT TERM GOAL #2   Title pt to increase L knee AROM to >/= 10 - 90 degrees for therapuetic progression    Baseline 15 - 102 on 8/4    Period Weeks    Status Partially Met             PT Long Term Goals - 07/15/20 1208      PT LONG TERM GOAL #1   Title Patient to demonstrate functional muscle strength as being MMT 5/5 in order to improve mechanics and reduce pain     Period Weeks    Status On-going      PT LONG TERM GOAL #2   Title Patient to be able to reciprocally ascend/descend full flight of stairs with U railing, reciprocal pattern, good eccenric control in order to improve home/community access     Period Weeks    Status On-going      PT LONG TERM GOAL #3   Title pt to be able to walk/ stand >/= 60 min with LRAD for community ambulation and assist with work related tasks.    Period Weeks    Status On-going      PT LONG  TERM GOAL #4   Title increase FOTO score to </= 30% limited to demo improvement in function    Period Weeks    Status On-going      PT LONG TERM GOAL #5   Title pt to be I with all HEP given to maintain and progress current level of function    Period Weeks    Status On-going                 Plan - 07/24/20 1022    Clinical Impression Statement Pt demonstrates improved AROM to 110 knee flexion as well as improved tolerance to SLS activities. Progressing toward LTGs.    PT Next Visit Plan check for VA approval ; review / update HEP PRN,  hip / knee strengthening, knee ROM, gait training, vaso for pain/ swelling-considert KT tape / retro massage; assess response to Estim    PT Home Exercise Plan [id number] - seated and supine hamstring stretch, supine and seated heel slide, quad set with towel under knee, sidelying hip abduction, prone knee hang, walking with metronome (app from phone).           Patient will benefit from skilled therapeutic intervention in order to improve the following deficits and impairments:  Improper body mechanics, Decreased strength, Increased muscle spasms, Abnormal gait, Postural dysfunction, Decreased range of motion, Decreased activity tolerance, Decreased balance, Decreased endurance, Pain, Increased edema  Visit Diagnosis: No diagnosis found.     Problem List Patient Active Problem List   Diagnosis Date Noted  . 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 Status post total right knee replacement 07/12/2018  . Primary osteoarthritis of left knee 04/11/2018  . Unilateral primary osteoarthritis, right knee 03/07/2018  . Chronic pain of right knee 03/07/2018  . Chronic pain of left knee 03/07/2018  . Malignant neoplasm of central portion of left female breast ([place]) 04/16/2014    Dorene Ar, PTA 07/24/2020,  10:24 AM  [facility] [address] [place], [place],  [address] Phone: [phone]   Fax:  [phone]  Name: Kayla Miles MRN: [id number] Date of Birth: [birth year]

## 2020-07-27 ENCOUNTER — Ambulatory Visit: Payer: No Typology Code available for payment source | Admitting: Orthopaedic Surgery

## 2020-07-28 ENCOUNTER — Other Ambulatory Visit: Payer: Self-pay

## 2020-07-28 ENCOUNTER — Ambulatory Visit: Payer: No Typology Code available for payment source | Admitting: Physical Therapy

## 2020-07-28 ENCOUNTER — Encounter: Payer: Self-pay | Admitting: Physical Therapy

## 2020-07-28 DIAGNOSIS — M6281 Muscle weakness (generalized): Secondary | ICD-10-CM

## 2020-07-28 DIAGNOSIS — R2689 Other abnormalities of gait and mobility: Secondary | ICD-10-CM

## 2020-07-28 DIAGNOSIS — G8929 Other chronic pain: Secondary | ICD-10-CM

## 2020-07-28 DIAGNOSIS — R600 Localized edema: Secondary | ICD-10-CM

## 2020-07-28 DIAGNOSIS — M25661 Stiffness of right knee, not elsewhere classified: Secondary | ICD-10-CM

## 2020-07-28 DIAGNOSIS — M25561 Pain in right knee: Secondary | ICD-10-CM

## 2020-07-28 NOTE — Therapy (Signed)
[place], [place], [address] Phone: [phone]   Fax:  [phone]  Physical Therapy Treatment  Patient Details  Name: Kayla Miles MRN: [id number] Date of Birth: 17-Oct-1951 Referring Provider (PT): Jean Rosenthal MD   Encounter Date: 07/28/2020   PT End of Session - 07/28/20 0940    Visit Number 17    Number of Visits 23    Date for PT Re-Evaluation 08/19/20    Authorization Type VA    Authorization Time Period 07/23/2020 - 11/18/2020    Authorization - Visit Number 2    Authorization - Number of Visits 15    PT Start Time [phone]   pt arrived 11 min late   PT Stop Time 1020    PT Time Calculation (min) 38 min    Activity Tolerance Patient tolerated treatment well    Behavior During Therapy [facility] for tasks assessed/performed           Past Medical History:  Diagnosis Date  . Arthralgia of knee, right   . Arthritis    left knee osteoarthritis   . Breast cancer ([place]) 03/26/14   Left  . HTN (hypertension)   . Hypothyroidism   . Pre-diabetes    05/15/20 - per pt, insisted that she was not diabetic or pre diabetic. pt does not check sugars at home and has never been diagnosed    Past Surgical History:  Procedure Laterality Date  . BREAST IMPLANT EXCHANGE Bilateral 05/03/2018  . MASTECTOMY Bilateral 2015   with reconstruction.   . TONSILLECTOMY    . TOTAL KNEE ARTHROPLASTY Right 05/29/2018  . TOTAL KNEE ARTHROPLASTY Right 05/29/2018   Procedure: RIGHT  TOTAL KNEE ARTHROPLASTY;  Surgeon: [name], MD;  Location: [facility];  Service: Orthopedics;  Laterality: Right;  . TOTAL KNEE ARTHROPLASTY Left 05/19/2020   Procedure: LEFT TOTAL KNEE ARTHROPLASTY;  Surgeon: [name], MD;  Location: [facility];  Service: Orthopedics;  Laterality: Left;    There were no vitals filed for this visit.   Subjective Assessment - 07/28/20 0945    Subjective "right now I am just sore  and stiff rated at 5-6/10. I thik the exercise are making more sore and I feel like the only thing that helps is bike."    Patient Stated Goals strengthening the knee, move on with life.    Currently in Pain? Yes    Pain Score 6     Pain Location Knee    Pain Orientation Left    Pain Descriptors / Indicators Aching    Pain Type Chronic pain              [facility] PT Assessment - 07/28/20 0001      Assessment   Medical Diagnosis L TKA    Referring Provider (PT) Jean Rosenthal MD    Onset Date/Surgical Date 05/19/20    Hand Dominance Right      AROM   Left Knee Extension 13    Left Knee Flexion 111                         [facility] Adult PT Treatment/Exercise - 07/28/20 0001      Knee/Hip Exercises: Stretches   Passive Hamstring Stretch 3 reps;30 seconds   standing with heel propped on 6 in ch step   Quad Stretch Limitations 3 x 30 sec with foot in the chair    Other Knee/Hip Stretches slant  board   gastroc 3 x 30 sec     Knee/Hip Exercises: Aerobic   Recumbent Bike L1 x 12 min   lowering seat every 3 min for knee flexion     Vasopneumatic   Number Minutes Vasopneumatic  10 minutes    Vasopnuematic Location  Knee    Vasopneumatic Pressure Medium    Vasopneumatic Temperature  34      Manual Therapy   Kinesiotex Create Space;Edema      Kinesiotix   Edema L knee                  PT Education - 07/28/20 0948    Education Details Reviewed importance of exercise and due to graded approach to exercise it is normal for soreness as the body gets stronger and adjust it gets easier.    Person(s) Educated Patient    Methods Explanation;Verbal cues    Comprehension Verbalized understanding;Verbal cues required            PT Short Term Goals - 07/15/20 1207      PT SHORT TERM GOAL #1   Title pt to be I with inital HEP    Period Weeks    Status Achieved      PT SHORT TERM GOAL #2   Title pt to increase L knee AROM to >/= 10 - 90 degrees for  therapuetic progression    Baseline 15 - 102 on 8/4    Period Weeks    Status Partially Met             PT Long Term Goals - 07/15/20 1208      PT LONG TERM GOAL #1   Title Patient to demonstrate functional muscle strength as being MMT 5/5 in order to improve mechanics and reduce pain     Period Weeks    Status On-going      PT LONG TERM GOAL #2   Title Patient to be able to reciprocally ascend/descend full flight of stairs with U railing, reciprocal pattern, good eccenric control in order to improve home/community access     Period Weeks    Status On-going      PT LONG TERM GOAL #3   Title pt to be able to walk/ stand >/= 60 min with LRAD for community ambulation and assist with work related tasks.    Period Weeks    Status On-going      PT LONG TERM GOAL #4   Title increase FOTO score to </= 30% limited to demo improvement in function    Period Weeks    Status On-going      PT LONG TERM GOAL #5   Title pt to be I with all HEP given to maintain and progress current level of function    Period Weeks    Status On-going                 Plan - 07/28/20 0949    Clinical Impression Statement pt arrived 11 min late today, noting increased soreness after every session for a few days. due to limited session today focused on knee ROM today using bike lowering seat, aggressive stretching. performed one more time using KT tape to promote edema reduction.contiued [place] end of session to calm dowm se    PT Treatment/Interventions ADLs/Self Care Home Management;Cryotherapy;Electrical Stimulation;Iontophoresis '4mg'$ /ml Dexamethasone;Moist Heat;Ultrasound;Gait training;Therapeutic exercise;Therapeutic activities;Neuromuscular re-education;Balance training;Passive range of motion;Taping;Vasopneumatic Device;Patient/family education    PT Next Visit Plan review / update HEP PRN,  hip / knee strengthening, knee ROM, gait training, vaso for pain/ swelling-considert KT tape / retro massage;  E-stim/ vaso PRN    PT Home Exercise Plan [id number] - seated and supine hamstring stretch, supine and seated heel slide, quad set with towel under knee, sidelying hip abduction, prone knee hang, walking with metronome (app from phone).    Consulted and Agree with Plan of Care Patient           Patient will benefit from skilled therapeutic intervention in order to improve the following deficits and impairments:  Improper body mechanics, Decreased strength, Increased muscle spasms, Abnormal gait, Postural dysfunction, Decreased range of motion, Decreased activity tolerance, Decreased balance, Decreased endurance, Pain, Increased edema  Visit Diagnosis: Localized edema  Chronic pain of left knee  Muscle weakness (generalized)  Other abnormalities of gait and mobility  Acute pain of right knee  Stiffness of right knee, not elsewhere classified     Problem List Patient Active Problem List   Diagnosis Date Noted  . 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 Status post total right knee replacement 07/12/2018  . Primary osteoarthritis of left knee 04/11/2018  . Unilateral primary osteoarthritis, right knee 03/07/2018  . Chronic pain of right knee 03/07/2018  . Chronic pain of left knee 03/07/2018  . Malignant neoplasm of central portion of left female breast ([place]) 04/16/2014   Starr Lake PT, DPT, LAT, ATC  07/28/20  10:50 AM      [place] [facility] [address] [place], [place], [address] Phone: [phone]   Fax:  [phone]  Name: Kayla Miles MRN: [id number] Date of Birth: October 12, 1951

## 2020-07-29 ENCOUNTER — Encounter: Payer: Self-pay | Admitting: Orthopaedic Surgery

## 2020-07-29 ENCOUNTER — Ambulatory Visit (INDEPENDENT_AMBULATORY_CARE_PROVIDER_SITE_OTHER): Payer: No Typology Code available for payment source | Admitting: Orthopaedic Surgery

## 2020-07-29 DIAGNOSIS — Z96652 Presence of left artificial knee joint: Secondary | ICD-10-CM

## 2020-07-29 NOTE — Progress Notes (Signed)
The patient is now 10 weeks status post a left total knee arthroplasty.  She is working through physical therapy.  Her extension is getting almost full and she said they flexed at about 110 degrees.  They have been using tape around her knee as well.  She is only taken Tylenol and occasional Advil.  She still continues to limp and has swelling.  We replaced her left knee in 2019.  On exam her calf is soft.  She has almost full extension and flexion past 90 degrees of the left knee and it feels ligamentously stable.  The incision is healing nicely.  At this point she will continue to work through therapy to get her knee bending and moving.  I will see her back in 3 months with a standing AP and lateral of her more recent left operative knee.  All questions and concerns were answered and addressed.

## 2020-07-31 ENCOUNTER — Other Ambulatory Visit: Payer: Self-pay

## 2020-07-31 ENCOUNTER — Encounter: Payer: Self-pay | Admitting: Physical Therapy

## 2020-07-31 ENCOUNTER — Ambulatory Visit: Payer: No Typology Code available for payment source | Admitting: Physical Therapy

## 2020-07-31 DIAGNOSIS — R2689 Other abnormalities of gait and mobility: Secondary | ICD-10-CM

## 2020-07-31 DIAGNOSIS — M6281 Muscle weakness (generalized): Secondary | ICD-10-CM

## 2020-07-31 DIAGNOSIS — M25561 Pain in right knee: Secondary | ICD-10-CM

## 2020-07-31 DIAGNOSIS — M25661 Stiffness of right knee, not elsewhere classified: Secondary | ICD-10-CM

## 2020-07-31 DIAGNOSIS — R600 Localized edema: Secondary | ICD-10-CM

## 2020-07-31 DIAGNOSIS — G8929 Other chronic pain: Secondary | ICD-10-CM

## 2020-07-31 NOTE — Therapy (Signed)
[facility] [address] [place], [place], [address] Phone: [phone]   Fax:  [phone]  Physical Therapy Treatment  Patient Details  Name: Kayla Miles MRN: [id number] Date of Birth: 1951-02-29 Referring Provider (PT): Doneen Poisson MD   Encounter Date: 07/31/2020   PT End of Session - 07/31/20 1048    Visit Number 18    Number of Visits 23    Date for PT Re-Evaluation 08/19/20    Authorization Type [facility]    Authorization Time Period 07/23/2020 - 11/18/2020    Authorization - Visit Number 3    Authorization - Number of Visits 15    Progress Note Due on Visit 24    PT Start Time 1047    PT Stop Time 1138    PT Time Calculation (min) 51 min    Activity Tolerance Patient tolerated treatment well    Behavior During Therapy [facility] for tasks assessed/performed           Past Medical History:  Diagnosis Date  . Arthralgia of knee, right   . Arthritis    left knee osteoarthritis   . Breast cancer (HCC) 03/26/14   Left  . HTN (hypertension)   . Hypothyroidism   . Pre-diabetes    05/15/20 - per pt, insisted that she was not diabetic or pre diabetic. pt does not check sugars at home and has never been diagnosed    Past Surgical History:  Procedure Laterality Date  . BREAST IMPLANT EXCHANGE Bilateral 05/03/2018  . MASTECTOMY Bilateral 2015   with reconstruction.   . TONSILLECTOMY    . TOTAL KNEE ARTHROPLASTY Right 05/29/2018  . TOTAL KNEE ARTHROPLASTY Right 05/29/2018   Procedure: RIGHT  TOTAL KNEE ARTHROPLASTY;  Surgeon: Kathryne Hitch, MD;  Location: [facility] OR;  Service: Orthopedics;  Laterality: Right;  . TOTAL KNEE ARTHROPLASTY Left 05/19/2020   Procedure: LEFT TOTAL KNEE ARTHROPLASTY;  Surgeon: Kathryne Hitch, MD;  Location: [facility] OR;  Service: Orthopedics;  Laterality: Left;    There were no vitals filed for this visit.   Subjective Assessment - 07/31/20 1049    Subjective "I wasn't as sore  after the last session like I had been in the previous visits. I think the tape is helping. I saw Dr Magnus Ivan on Wed and stated he is pleased with the progress I am making."    Currently in Pain? Yes    Pain Score 4     Pain Location Knee    Pain Orientation Left    Pain Descriptors / Indicators Aching    Pain Type Chronic pain    Pain Onset More than a month ago    Pain Frequency Intermittent    Aggravating Factors  sleeping, end range, activity              [facility] PT Assessment - 07/31/20 0001      Assessment   Medical Diagnosis L TKA    Referring Provider (PT) Doneen Poisson MD      AROM   Left Knee Extension 18    Left Knee Flexion 111                         [facility] Adult PT Treatment/Exercise - 07/31/20 0001      Knee/Hip Exercises: Stretches   Passive Hamstring Stretch 3 reps;30 seconds    Quad Stretch Limitations 3 x 30 sec with foot in the chair    Other Knee/Hip  Stretches slant board   2 x gastroc     Knee/Hip Exercises: Aerobic   Recumbent Bike L1 x 8 min    lowering seated 4 min to promote knee flexion     Knee/Hip Exercises: Machines for Strengthening   Cybex Knee Extension 2 x 12 10# bil con/ ecc LLE    Cybex Knee Flexion 2 x 12 20# con bil, ecc LLE    Cybex Leg Press --      Knee/Hip Exercises: Seated   Sit to Sand 2 sets;10 reps   with RLE advanced forward to place more stress on LLE     Knee/Hip Exercises: Prone   Other Prone Exercises prone knee hang x 5 min with 2# weight      Vasopneumatic   Number Minutes Vasopneumatic  10 minutes    Vasopnuematic Location  Knee    Vasopneumatic Pressure Medium      Manual Therapy   Manual therapy comments DTM along the quad                  PT Education - 07/31/20 1133    Education Details reviewed prone hang and it should be done daily for knee extension    Person(s) Educated Patient    Methods Explanation;Verbal cues    Comprehension Verbalized understanding;Verbal cues  required            PT Short Term Goals - 07/15/20 1207      PT SHORT TERM GOAL #1   Title pt to be I with inital HEP    Period Weeks    Status Achieved      PT SHORT TERM GOAL #2   Title pt to increase L knee AROM to >/= 10 - 90 degrees for therapuetic progression    Baseline 15 - 102 on 8/4    Period Weeks    Status Partially Met             PT Long Term Goals - 07/15/20 1208      PT LONG TERM GOAL #1   Title Patient to demonstrate functional muscle strength as being MMT 5/5 in order to improve mechanics and reduce pain     Period Weeks    Status On-going      PT LONG TERM GOAL #2   Title Patient to be able to reciprocally ascend/descend full flight of stairs with U railing, reciprocal pattern, good eccenric control in order to improve home/community access     Period Weeks    Status On-going      PT LONG TERM GOAL #3   Title pt to be able to walk/ stand >/= 60 min with LRAD for community ambulation and assist with work related tasks.    Period Weeks    Status On-going      PT LONG TERM GOAL #4   Title increase FOTO score to </= 30% limited to demo improvement in function    Period Weeks    Status On-going      PT LONG TERM GOAL #5   Title pt to be I with all HEP given to maintain and progress current level of function    Period Weeks    Status On-going                 Plan - 07/31/20 1131    Clinical Impression Statement continued working on STW along the quad to promote knee ROM. Reviewed prone hangs and discussed she needs to be doing  this daily at home. Continued working on hip/ knee strengthening which she responded well but does fatigue quickly. Continued VAso for swelling end of session.    PT Treatment/Interventions ADLs/Self Care Home Management;Cryotherapy;Electrical Stimulation;Iontophoresis 4mg /ml Dexamethasone;Moist Heat;Ultrasound;Gait training;Therapeutic exercise;Therapeutic activities;Neuromuscular re-education;Balance training;Passive  range of motion;Taping;Vasopneumatic Device;Patient/family education    PT Next Visit Plan review / update HEP PRN,  hip / knee strengthening, knee ROM, gait training, vaso for pain/ swelling-considert KT tape / retro massage; E-stim/ vaso PRN    PT Home Exercise Plan [id number] - seated and supine hamstring stretch, supine and seated heel slide, quad set with towel under knee, sidelying hip abduction, prone knee hang, walking with metronome (app from phone).    Consulted and Agree with Plan of Care Patient           Patient will benefit from skilled therapeutic intervention in order to improve the following deficits and impairments:  Improper body mechanics, Decreased strength, Increased muscle spasms, Abnormal gait, Postural dysfunction, Decreased range of motion, Decreased activity tolerance, Decreased balance, Decreased endurance, Pain, Increased edema  Visit Diagnosis: Localized edema  Chronic pain of left knee  Muscle weakness (generalized)  Other abnormalities of gait and mobility  Acute pain of right knee  Stiffness of right knee, not elsewhere classified     Problem List Patient Active Problem List   Diagnosis Date Noted  . 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 Status post total right knee replacement 07/12/2018  . Primary osteoarthritis of left knee 04/11/2018  . Unilateral primary osteoarthritis, right knee 03/07/2018  . Chronic pain of right knee 03/07/2018  . Chronic pain of left knee 03/07/2018  . Malignant neoplasm of central portion of left female breast ([place]) 04/16/2014   Starr Lake PT, DPT, LAT, ATC  07/31/20  11:41 AM      [facility] [address] [place], [place], [address] Phone: [phone]   Fax:  [phone]  Name: Kayla Miles MRN: [id number] Date of Birth: [birth year]

## 2020-08-03 ENCOUNTER — Other Ambulatory Visit: Payer: Self-pay

## 2020-08-03 ENCOUNTER — Ambulatory Visit: Payer: No Typology Code available for payment source | Admitting: Physical Therapy

## 2020-08-03 DIAGNOSIS — R600 Localized edema: Secondary | ICD-10-CM

## 2020-08-03 DIAGNOSIS — M25562 Pain in left knee: Secondary | ICD-10-CM

## 2020-08-03 DIAGNOSIS — R2689 Other abnormalities of gait and mobility: Secondary | ICD-10-CM

## 2020-08-03 DIAGNOSIS — M6281 Muscle weakness (generalized): Secondary | ICD-10-CM

## 2020-08-03 NOTE — Therapy (Signed)
[place], [place], [address] Phone: [phone]   Fax:  [phone]  Physical Therapy Treatment  Patient Details  Name: Kayla Miles MRN: [id number] Date of Birth: [birth year] Referring Provider (PT): Jean Rosenthal MD   Encounter Date: 08/03/2020   PT End of Session - 08/03/20 1030    Visit Number 19    Number of Visits 23    Date for PT Re-Evaluation 08/19/20    Authorization Type VA    Authorization Time Period 07/23/2020 - 11/18/2020    Authorization - Visit Number 4    Authorization - Number of Visits 15    Progress Note Due on Visit 24    PT Start Time 1022   pt was scheduled at 9:30 but arrived late at 10:22 and opted to still come for ice.   PT Stop Time 1043    PT Time Calculation (min) 21 min    Activity Tolerance Patient tolerated treatment well    Behavior During Therapy [facility] for tasks assessed/performed           Past Medical History:  Diagnosis Date  . Arthralgia of knee, right   . Arthritis    left knee osteoarthritis   . Breast cancer ([place]) 03/26/14   Left  . HTN (hypertension)   . Hypothyroidism   . Pre-diabetes    05/15/20 - per pt, insisted that she was not diabetic or pre diabetic. pt does not check sugars at home and has never been diagnosed    Past Surgical History:  Procedure Laterality Date  . BREAST IMPLANT EXCHANGE Bilateral 05/03/2018  . MASTECTOMY Bilateral 2015   with reconstruction.   . TONSILLECTOMY    . TOTAL KNEE ARTHROPLASTY Right 05/29/2018  . TOTAL KNEE ARTHROPLASTY Right 05/29/2018   Procedure: RIGHT  TOTAL KNEE ARTHROPLASTY;  Surgeon: [name], MD;  Location: [place];  Service: Orthopedics;  Laterality: Right;  . TOTAL KNEE ARTHROPLASTY Left 05/19/2020   Procedure: LEFT TOTAL KNEE ARTHROPLASTY;  Surgeon: [name], MD;  Location: [facility];  Service: Orthopedics;  Laterality: Left;    There were no vitals filed for  this visit.   Subjective Assessment - 08/03/20 1027    Subjective "I am feeling having increased soreness becuase I returned back to work from sitting for a long period of time. All I want to do is the ice today."    Currently in Pain? Yes    Pain Location Knee    Pain Orientation Left    Pain Onset More than a month ago    Pain Frequency Intermittent    Aggravating Factors  sleeping, end range motion    Pain Relieving Factors ice, rest,              [facility] PT Assessment - 08/03/20 0001      Assessment   Medical Diagnosis L TKA    Referring Provider (PT) Jean Rosenthal MD                         [facility] Adult PT Treatment/Exercise - 08/03/20 0001      Vasopneumatic   Number Minutes Vasopneumatic  15 minutes    Vasopnuematic Location  Knee    Vasopneumatic Pressure Medium    Vasopneumatic Temperature  34                  PT Education - 08/03/20 1035    Education Details  reviewed HEP and discussed importance    Person(s) Educated Patient    Methods Explanation;Verbal cues;Handout    Comprehension Verbalized understanding;Verbal cues required            PT Short Term Goals - 07/15/20 1207      PT SHORT TERM GOAL #1   Title pt to be I with inital HEP    Period Weeks    Status Achieved      PT SHORT TERM GOAL #2   Title pt to increase L knee AROM to >/= 10 - 90 degrees for therapuetic progression    Baseline 15 - 102 on 8/4    Period Weeks    Status Partially Met             PT Long Term Goals - 07/15/20 1208      PT LONG TERM GOAL #1   Title Patient to demonstrate functional muscle strength as being MMT 5/5 in order to improve mechanics and reduce pain     Period Weeks    Status On-going      PT LONG TERM GOAL #2   Title Patient to be able to reciprocally ascend/descend full flight of stairs with U railing, reciprocal pattern, good eccenric control in order to improve home/community access     Period Weeks    Status  On-going      PT LONG TERM GOAL #3   Title pt to be able to walk/ stand >/= 60 min with LRAD for community ambulation and assist with work related tasks.    Period Weeks    Status On-going      PT LONG TERM GOAL #4   Title increase FOTO score to </= 30% limited to demo improvement in function    Period Weeks    Status On-going      PT LONG TERM GOAL #5   Title pt to be I with all HEP given to maintain and progress current level of function    Period Weeks    Status On-going                 Plan - 08/03/20 1032    Clinical Impression Statement pt arrived late to her appointment requesting to still be seen but to only receive ice due to having increased soreness/ swelling secondary to starting work today. Reviewed HEP and discussed importance of continued strengthening.    PT Treatment/Interventions ADLs/Self Care Home Management;Cryotherapy;Electrical Stimulation;Iontophoresis 47m/ml Dexamethasone;Moist Heat;Ultrasound;Gait training;Therapeutic exercise;Therapeutic activities;Neuromuscular re-education;Balance training;Passive range of motion;Taping;Vasopneumatic Device;Patient/family education    PT Next Visit Plan review / update HEP PRN,  hip / knee strengthening, knee ROM, gait training, vaso for pain/ swelling-considert KT tape / retro massage; E-stim/ vaso PRN    PT Home Exercise Plan [id number] - seated and supine hamstring stretch, supine and seated heel slide, quad set with towel under knee, sidelying hip abduction, prone knee hang, walking with metronome (app from phone).    Consulted and Agree with Plan of Care Patient           Patient will benefit from skilled therapeutic intervention in order to improve the following deficits and impairments:  Improper body mechanics, Decreased strength, Increased muscle spasms, Abnormal gait, Postural dysfunction, Decreased range of motion, Decreased activity tolerance, Decreased balance, Decreased endurance, Pain, Increased  edema  Visit Diagnosis: Localized edema  Chronic pain of left knee  Muscle weakness (generalized)  Other abnormalities of gait and mobility     Problem List Patient Active Problem  List   Diagnosis Date Noted  . 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 Status post total right knee replacement 07/12/2018  . Primary osteoarthritis of left knee 04/11/2018  . Unilateral primary osteoarthritis, right knee 03/07/2018  . Chronic pain of right knee 03/07/2018  . Chronic pain of left knee 03/07/2018  . Malignant neoplasm of central portion of left female breast ([place]) 04/16/2014   Starr Lake PT, DPT, LAT, ATC  08/03/20  10:38 AM      [facility] [address] [place], [place], [address] Phone: [phone]   Fax:  [phone]  Name: Mariacristina Aday MRN: [id number] Date of Birth: 1951/02/24

## 2020-08-05 ENCOUNTER — Encounter: Payer: Self-pay | Admitting: Physical Therapy

## 2020-08-05 ENCOUNTER — Other Ambulatory Visit: Payer: Self-pay

## 2020-08-05 ENCOUNTER — Ambulatory Visit: Payer: No Typology Code available for payment source | Admitting: Physical Therapy

## 2020-08-05 DIAGNOSIS — M6281 Muscle weakness (generalized): Secondary | ICD-10-CM

## 2020-08-05 DIAGNOSIS — R600 Localized edema: Secondary | ICD-10-CM | POA: Diagnosis not present

## 2020-08-05 DIAGNOSIS — M25562 Pain in left knee: Secondary | ICD-10-CM

## 2020-08-05 DIAGNOSIS — R2689 Other abnormalities of gait and mobility: Secondary | ICD-10-CM

## 2020-08-05 NOTE — Therapy (Signed)
[place], [place], [address] Phone: [phone]   Fax:  [phone]  Physical Therapy Treatment  Patient Details  Name: Kayla Miles MRN: [id number] Date of Birth: 12/07/1951 Referring Provider (PT): Jean Rosenthal MD   Encounter Date: 08/05/2020   PT End of Session - 08/05/20 1037    Visit Number 20    Number of Visits 23    Date for PT Re-Evaluation 08/19/20    Authorization Type VA    Authorization Time Period 07/23/2020 - 11/18/2020    Authorization - Visit Number 5    Authorization - Number of Visits 15    PT Start Time 1017    PT Stop Time 1115    PT Time Calculation (min) 58 min           Past Medical History:  Diagnosis Date  . Arthralgia of knee, right   . Arthritis    left knee osteoarthritis   . Breast cancer ([place]) 03/26/14   Left  . HTN (hypertension)   . Hypothyroidism   . Pre-diabetes    05/15/20 - per pt, insisted that she was not diabetic or pre diabetic. pt does not check sugars at home and has never been diagnosed    Past Surgical History:  Procedure Laterality Date  . BREAST IMPLANT EXCHANGE Bilateral 05/03/2018  . MASTECTOMY Bilateral 2015   with reconstruction.   . TONSILLECTOMY    . TOTAL KNEE ARTHROPLASTY Right 05/29/2018  . TOTAL KNEE ARTHROPLASTY Right 05/29/2018   Procedure: RIGHT  TOTAL KNEE ARTHROPLASTY;  Surgeon: [name], MD;  Location: [facility];  Service: Orthopedics;  Laterality: Right;  . TOTAL KNEE ARTHROPLASTY Left 05/19/2020   Procedure: LEFT TOTAL KNEE ARTHROPLASTY;  Surgeon: [name], MD;  Location: [place];  Service: Orthopedics;  Laterality: Left;    There were no vitals filed for this visit.   Subjective Assessment - 08/05/20 1022    Subjective It hurts when I start to walk. it is stiff. I am frustrated with the progress with my knee. I saw MD last week and he said it looks good.    Currently in Pain? Yes     Pain Score 3     Pain Location Knee    Pain Orientation Left    Pain Descriptors / Indicators Aching;Tightness    Pain Type Chronic pain              [facility] PT Assessment - 08/05/20 0001      AROM   Left Knee Extension 15    Left Knee Flexion 111                         [facility] Adult PT Treatment/Exercise - 08/05/20 0001      Knee/Hip Exercises: Stretches   Knee: Self-Stretch Limitations scoot stretch x 5     Other Knee/Hip Stretches slant board   2 x gastroc     Knee/Hip Exercises: Aerobic   Recumbent Bike L1 x 6 min    Nustep L5 LE x 7 minutes       Knee/Hip Exercises: Machines for Strengthening   Total Gym Leg Press Seated Leg press 55# bilateral       Knee/Hip Exercises: Standing   Forward Step Up 2 sets;10 reps;Step Height: 6"    Functional Squat 20 reps   with bil HHA from free motion     Vasopneumatic   Number Minutes  Vasopneumatic  15 minutes    Vasopnuematic Location  Knee    Vasopneumatic Pressure Medium    Vasopneumatic Temperature  34                    PT Short Term Goals - 07/15/20 1207      PT SHORT TERM GOAL #1   Title pt to be I with inital HEP    Period Weeks    Status Achieved      PT SHORT TERM GOAL #2   Title pt to increase L knee AROM to >/= 10 - 90 degrees for therapuetic progression    Baseline 15 - 102 on 8/4    Period Weeks    Status Partially Met             PT Long Term Goals - 07/15/20 1208      PT LONG TERM GOAL #1   Title Patient to demonstrate functional muscle strength as being MMT 5/5 in order to improve mechanics and reduce pain     Period Weeks    Status On-going      PT LONG TERM GOAL #2   Title Patient to be able to reciprocally ascend/descend full flight of stairs with U railing, reciprocal pattern, good eccenric control in order to improve home/community access     Period Weeks    Status On-going      PT LONG TERM GOAL #3   Title pt to be able to walk/ stand >/= 60 min with LRAD for  community ambulation and assist with work related tasks.    Period Weeks    Status On-going      PT LONG TERM GOAL #4   Title increase FOTO score to </= 30% limited to demo improvement in function    Period Weeks    Status On-going      PT LONG TERM GOAL #5   Title pt to be I with all HEP given to maintain and progress current level of function    Period Weeks    Status On-going                 Plan - 08/05/20 1203    Clinical Impression Statement Pt arrives reporting frustration with the progress on her knee since TKA. 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 Encouraged her to ice more frequently and consider icing between morning and afternoon shifts. Briefly discussed compression stocking however it would be too hot during warmer temps at work. Continued with Aerobic conditioning and LE strength /ROM.    PT Next Visit Plan review / update HEP PRN,  hip / knee strengthening, knee ROM, gait training, vaso for pain/ swelling-considert KT tape / retro massage; E-stim/ vaso PRN    PT Home Exercise Plan [id number] - seated and supine hamstring stretch, supine and seated heel slide, quad set with towel under knee, sidelying hip abduction, prone knee hang, walking with metronome (app from phone).           Patient will benefit from skilled therapeutic intervention in order to improve the following deficits and impairments:  Improper body mechanics, Decreased strength, Increased muscle spasms, Abnormal gait, Postural dysfunction, Decreased range of motion, Decreased activity tolerance, Decreased balance, Decreased endurance, Pain, Increased edema  Visit Diagnosis: Localized edema  Chronic pain of left knee  Muscle weakness (generalized)  Other abnormalities of gait and mobility  Problem List Patient Active Problem List   Diagnosis Date Noted  . 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  Status post total right knee replacement 07/12/2018  . Primary osteoarthritis of left knee 04/11/2018  . Unilateral primary osteoarthritis, right knee 03/07/2018  . Chronic pain of right knee 03/07/2018  . Chronic pain of left knee 03/07/2018  . Malignant neoplasm of central portion of left female breast [facility]) 04/16/2014    Dorene Ar, PTA 08/05/2020, 12:15 PM  [place], [place], [address] Phone: [phone]   Fax:  [phone]  Name: Bryttani Blew MRN: [id number] Date of Birth: 03/21/1951

## 2020-08-10 ENCOUNTER — Other Ambulatory Visit: Payer: Self-pay

## 2020-08-10 ENCOUNTER — Ambulatory Visit: Payer: No Typology Code available for payment source | Admitting: Physical Therapy

## 2020-08-10 ENCOUNTER — Encounter: Payer: Self-pay | Admitting: Physical Therapy

## 2020-08-10 DIAGNOSIS — R600 Localized edema: Secondary | ICD-10-CM

## 2020-08-10 DIAGNOSIS — M6281 Muscle weakness (generalized): Secondary | ICD-10-CM

## 2020-08-10 DIAGNOSIS — M25562 Pain in left knee: Secondary | ICD-10-CM

## 2020-08-10 DIAGNOSIS — R2689 Other abnormalities of gait and mobility: Secondary | ICD-10-CM

## 2020-08-10 NOTE — Therapy (Signed)
[place], [place], [address] Phone: [phone]   Fax:  [phone]  Physical Therapy Treatment  Patient Details  Name: Kayla Miles MRN: [id number] Date of Birth: May 05, 1951 Referring Provider (PT): Jean Rosenthal MD   Encounter Date: 08/10/2020   PT End of Session - 08/10/20 1018    Visit Number 21    Number of Visits 23    Date for PT Re-Evaluation 08/19/20    Authorization Type VA    Authorization Time Period 07/23/2020 - 11/18/2020    Authorization - Visit Number 6    Authorization - Number of Visits 15    Progress Note Due on Visit 24    PT Start Time 1018    PT Stop Time 1105    PT Time Calculation (min) 47 min    Activity Tolerance Patient tolerated treatment well    Behavior During Therapy [facility] for tasks assessed/performed           Past Medical History:  Diagnosis Date   Arthralgia of knee, right    Arthritis    left knee osteoarthritis    Breast cancer ([place]) 03/26/14   Left   HTN (hypertension)    Hypothyroidism    Pre-diabetes    05/15/20 - per pt, insisted that she was not diabetic or pre diabetic. pt does not check sugars at home and has never been diagnosed    Past Surgical History:  Procedure Laterality Date   BREAST IMPLANT EXCHANGE Bilateral 05/03/2018   MASTECTOMY Bilateral 2015   with reconstruction.    TONSILLECTOMY     TOTAL KNEE ARTHROPLASTY Right 05/29/2018   TOTAL KNEE ARTHROPLASTY Right 05/29/2018   Procedure: RIGHT  TOTAL KNEE ARTHROPLASTY;  Surgeon: [name], MD;  Location: [place];  Service: Orthopedics;  Laterality: Right;   TOTAL KNEE ARTHROPLASTY Left 05/19/2020   Procedure: LEFT TOTAL KNEE ARTHROPLASTY;  Surgeon: [name], MD;  Location: [place];  Service: Orthopedics;  Laterality: Left;    There were no vitals filed for this visit.   Subjective Assessment - 08/10/20 1020    Subjective "I've been doing  alot of standing and it feels tight like there are bands around the knee."    Patient Stated Goals strengthening the knee, move on with life.    Currently in Pain? Yes    Pain Score 0-No pain    Pain Orientation Left    Pain Descriptors / Indicators Aching    Pain Type Chronic pain    Pain Onset More than a month ago    Pain Frequency Intermittent    Aggravating Factors  getting up after sitting,              [facility] PT Assessment - 08/10/20 0001      Assessment   Medical Diagnosis L TKA    Referring Provider (PT) Jean Rosenthal MD      AROM   Left Knee Extension 18    Left Knee Flexion 100      PROM   Left Knee Extension 15    Left Knee Flexion 105                         [facility] Adult PT Treatment/Exercise - 08/10/20 0001      Knee/Hip Exercises: Stretches   Passive Hamstring Stretch 2 reps;30 seconds;Left      Knee/Hip Exercises: Aerobic   Recumbent Bike L3 x 6 min  lowered seat at 3 min to promote flexion     Knee/Hip Exercises: Machines for Strengthening   Cybex Knee Extension 2 x 12 10# bil con/ ecc LLE    Cybex Knee Flexion 2 x 12 20# LLE only    Total Gym Leg Press Seated Leg press 35# LLE only 3 x 15       Knee/Hip Exercises: Standing   Forward Step Up 2 sets;10 reps;Step Height: 6"      Knee/Hip Exercises: Seated   Stool Scoot - Round Trips --      Vasopneumatic   Number Minutes Vasopneumatic  10 minutes    Vasopnuematic Location  Knee    Vasopneumatic Pressure Medium    Vasopneumatic Temperature  34      Manual Therapy   Manual Therapy Other (comment)    Joint Mobilization tibiofemiral AP/ PAmobs grade III- IV, tibial ER with quad set to promote extension    Other Manual Therapy ante and retro grade massage for swelling                   PT Education - 08/10/20 1057    Education Details reviewed HEP and that walking is great activity but she really needs to be doing the exercises and stretches.    Person(s)  Educated Patient    Methods Explanation;Verbal cues    Comprehension Verbalized understanding;Verbal cues required            PT Short Term Goals - 07/15/20 1207      PT SHORT TERM GOAL #1   Title pt to be I with inital HEP    Period Weeks    Status Achieved      PT SHORT TERM GOAL #2   Title pt to increase L knee AROM to >/= 10 - 90 degrees for therapuetic progression    Baseline 15 - 102 on 8/4    Period Weeks    Status Partially Met             PT Long Term Goals - 07/15/20 1208      PT LONG TERM GOAL #1   Title Patient to demonstrate functional muscle strength as being MMT 5/5 in order to improve mechanics and reduce pain     Period Weeks    Status On-going      PT LONG TERM GOAL #2   Title Patient to be able to reciprocally ascend/descend full flight of stairs with U railing, reciprocal pattern, good eccenric control in order to improve home/community access     Period Weeks    Status On-going      PT LONG TERM GOAL #3   Title pt to be able to walk/ stand >/= 60 min with LRAD for community ambulation and assist with work related tasks.    Period Weeks    Status On-going      PT LONG TERM GOAL #4   Title increase FOTO score to </= 30% limited to demo improvement in function    Period Weeks    Status On-going      PT LONG TERM GOAL #5   Title pt to be I with all HEP given to maintain and progress current level of function    Period Weeks    Status On-going                 Plan - 08/10/20 1056    Clinical Impression Statement pt demonstrates increased swelling in the knee limiting knee ROM  18 - 105 today. continued working on knee strengtheing progressing to single leg strengthening with increased reps to promote endurnace. reviewed importance of doing all exercises to benefit at home. reviewed how to perform antegrade/ retrograde edema massage, continued vaso end of session at end of session.    PT Treatment/Interventions ADLs/Self Care Home  Management;Cryotherapy;Electrical Stimulation;Iontophoresis 39m/ml Dexamethasone;Moist Heat;Ultrasound;Gait training;Therapeutic exercise;Therapeutic activities;Neuromuscular re-education;Balance training;Passive range of motion;Taping;Vasopneumatic Device;Patient/family education    PT Next Visit Plan review / update HEP PRN,  functional strengthening/ mobility,  knee ROM, gait training, vaso for pain/ swelling-considert KT tape / retro massage; E-stim/ vaso PRN    PT Home Exercise Plan [id number] - seated and supine hamstring stretch, supine and seated heel slide, quad set with towel under knee, sidelying hip abduction, prone knee hang, walking with metronome (app from phone).    Consulted and Agree with Plan of Care Patient           Patient will benefit from skilled therapeutic intervention in order to improve the following deficits and impairments:  Improper body mechanics, Decreased strength, Increased muscle spasms, Abnormal gait, Postural dysfunction, Decreased range of motion, Decreased activity tolerance, Decreased balance, Decreased endurance, Pain, Increased edema  Visit Diagnosis: Localized edema  Chronic pain of left knee  Muscle weakness (generalized)  Other abnormalities of gait and mobility     Problem List Patient Active Problem List   Diagnosis Date Noted   Status post total left knee replacement 05/19/2020   Status post total right knee replacement 07/12/2018   Primary osteoarthritis of left knee 04/11/2018   Unilateral primary osteoarthritis, right knee 03/07/2018   Chronic pain of right knee 03/07/2018   Chronic pain of left knee 03/07/2018   Malignant neoplasm of central portion of left female breast ([place] 04/16/2014    KStarr LakePT, DPT, LAT, ATC  08/10/20  11:00 AM      CPiattCR[facility]GAlpha NAl[place] [address]Phone: [phone]  Fax:  [phone] Name: MKemora PinardMRN: [id number] of Birth: 5October 16, 1952

## 2020-08-12 ENCOUNTER — Ambulatory Visit: Payer: No Typology Code available for payment source | Admitting: Physical Therapy

## 2020-08-18 ENCOUNTER — Other Ambulatory Visit: Payer: Self-pay

## 2020-08-18 ENCOUNTER — Ambulatory Visit: Payer: No Typology Code available for payment source | Attending: Orthopaedic Surgery | Admitting: Physical Therapy

## 2020-08-18 ENCOUNTER — Encounter: Payer: Self-pay | Admitting: Physical Therapy

## 2020-08-18 DIAGNOSIS — M25661 Stiffness of right knee, not elsewhere classified: Secondary | ICD-10-CM | POA: Insufficient documentation

## 2020-08-18 DIAGNOSIS — R600 Localized edema: Secondary | ICD-10-CM | POA: Insufficient documentation

## 2020-08-18 DIAGNOSIS — M25562 Pain in left knee: Secondary | ICD-10-CM | POA: Insufficient documentation

## 2020-08-18 DIAGNOSIS — R2689 Other abnormalities of gait and mobility: Secondary | ICD-10-CM | POA: Insufficient documentation

## 2020-08-18 DIAGNOSIS — M6281 Muscle weakness (generalized): Secondary | ICD-10-CM | POA: Diagnosis present

## 2020-08-18 DIAGNOSIS — G8929 Other chronic pain: Secondary | ICD-10-CM | POA: Diagnosis present

## 2020-08-18 NOTE — Therapy (Signed)
[place], [place], [address] Phone: [phone]   Fax:  [phone]  Physical Therapy Treatment  Patient Details  Name: Kayla Miles MRN: [id number] Date of Birth: September 21, 1951 Referring Provider (PT): Jean Rosenthal MD   Encounter Date: 08/18/2020   PT End of Session - 08/18/20 1043    Visit Number 22    Number of Visits 23    Date for PT Re-Evaluation 08/19/20    Authorization Type VA    Authorization Time Period 07/23/2020 - 11/18/2020    Authorization - Visit Number 7    Authorization - Number of Visits 15    Progress Note Due on Visit 24    PT Start Time 1751    PT Stop Time 1100    PT Time Calculation (min) 45 min           Past Medical History:  Diagnosis Date  . Arthralgia of knee, right   . Arthritis    left knee osteoarthritis   . Breast cancer ([place]) 03/26/14   Left  . HTN (hypertension)   . Hypothyroidism   . Pre-diabetes    05/15/20 - per pt, insisted that she was not diabetic or pre diabetic. pt does not check sugars at home and has never been diagnosed    Past Surgical History:  Procedure Laterality Date  . BREAST IMPLANT EXCHANGE Bilateral 05/03/2018  . MASTECTOMY Bilateral 2015   with reconstruction.   . TONSILLECTOMY    . TOTAL KNEE ARTHROPLASTY Right 05/29/2018  . TOTAL KNEE ARTHROPLASTY Right 05/29/2018   Procedure: RIGHT  TOTAL KNEE ARTHROPLASTY;  Surgeon: [name], MD;  Location: [place];  Service: Orthopedics;  Laterality: Right;  . TOTAL KNEE ARTHROPLASTY Left 05/19/2020   Procedure: LEFT TOTAL KNEE ARTHROPLASTY;  Surgeon: [name], MD;  Location: [place];  Service: Orthopedics;  Laterality: Left;    There were no vitals filed for this visit.   Subjective Assessment - 08/18/20 1018    Subjective I am moving so I have been busy. I think the swelling has gone down.    Currently in Pain? No/denies              [facility] PT  Assessment - 08/18/20 0001      AROM   Left Knee Extension 13    Left Knee Flexion 108      PROM   Left Knee Flexion 112                         [facility] Adult PT Treatment/Exercise - 08/18/20 0001      Knee/Hip Exercises: Stretches   Active Hamstring Stretch 3 reps;30 seconds    Active Hamstring Stretch Limitations supine with strap     Quad Stretch Limitations 3 x 30 sec with strap     Other Knee/Hip Stretches slant board   2 x gastroc     Knee/Hip Exercises: Aerobic   Recumbent Bike L3 x 6 min    lowered seat at 3 min to promote flexion     Knee/Hip Exercises: Machines for Strengthening   Cybex Knee Extension 2x 12 10# single     Cybex Knee Flexion 2 x 12 20# LLE only    Total Gym Leg Press Seated Leg press 35# LLE only 2 x 25       Knee/Hip Exercises: Standing   Forward Step Up 20 reps;Hand Hold: 1;Step Height: 6"  Functional Squat 20 reps   with bil HHA from free motion                   PT Short Term Goals - 07/15/20 1207      PT SHORT TERM GOAL #1   Title pt to be I with inital HEP    Period Weeks    Status Achieved      PT SHORT TERM GOAL #2   Title pt to increase L knee AROM to >/= 10 - 90 degrees for therapuetic progression    Baseline 15 - 102 on 8/4    Period Weeks    Status Partially Met             PT Long Term Goals - 07/15/20 1208      PT LONG TERM GOAL #1   Title Patient to demonstrate functional muscle strength as being MMT 5/5 in order to improve mechanics and reduce pain     Period Weeks    Status On-going      PT LONG TERM GOAL #2   Title Patient to be able to reciprocally ascend/descend full flight of stairs with U railing, reciprocal pattern, good eccenric control in order to improve home/community access     Period Weeks    Status On-going      PT LONG TERM GOAL #3   Title pt to be able to walk/ stand >/= 60 min with LRAD for community ambulation and assist with work related tasks.    Period Weeks     Status On-going      PT LONG TERM GOAL #4   Title increase FOTO score to </= 30% limited to demo improvement in function    Period Weeks    Status On-going      PT LONG TERM GOAL #5   Title pt to be I with all HEP given to maintain and progress current level of function    Period Weeks    Status On-going                 Plan - 08/18/20 1048    Clinical Impression Statement Pt reports decreased edema. She attributes it to increased activity. Continued with Closed chain strength and LE machines. ROM improved vs last session. Needs to work on eccentric quad and get FOTO status.    PT Treatment/Interventions ADLs/Self Care Home Management;Cryotherapy;Electrical Stimulation;Iontophoresis 27m/ml Dexamethasone;Moist Heat;Ultrasound;Gait training;Therapeutic exercise;Therapeutic activities;Neuromuscular re-education;Balance training;Passive range of motion;Taping;Vasopneumatic Device;Patient/family education    PT Next Visit Plan Needs FOTO status, functional strengthening, edema mgmt prn, gait, vaso, eccentric stairs    PT Home Exercise Plan [id number] - seated and supine hamstring stretch, supine and seated heel slide, quad set with towel under knee, sidelying hip abduction, prone knee hang, walking with metronome (app from phone).           Patient will benefit from skilled therapeutic intervention in order to improve the following deficits and impairments:  Improper body mechanics, Decreased strength, Increased muscle spasms, Abnormal gait, Postural dysfunction, Decreased range of motion, Decreased activity tolerance, Decreased balance, Decreased endurance, Pain, Increased edema  Visit Diagnosis: Localized edema  Chronic pain of left knee  Muscle weakness (generalized)  Other abnormalities of gait and mobility     Problem List Patient Active Problem List   Diagnosis Date Noted  . 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 Status post total right knee replacement  07/12/2018  . Primary osteoarthritis of left knee 04/11/2018  .  Unilateral primary osteoarthritis, right knee 03/07/2018  . Chronic pain of right knee 03/07/2018  . Chronic pain of left knee 03/07/2018  . Malignant neoplasm of central portion of left female breast ([place]) 04/16/2014    Dorene Ar, PTA 08/18/2020, 10:59 AM  [place], [place], [address] Phone: [phone]   Fax:  [phone]  Name: Kayla Miles MRN: [id number] Date of Birth: 06-27-1951

## 2020-08-20 ENCOUNTER — Ambulatory Visit: Payer: No Typology Code available for payment source | Admitting: Physical Therapy

## 2020-08-25 ENCOUNTER — Encounter: Payer: Self-pay | Admitting: Physical Therapy

## 2020-08-25 ENCOUNTER — Other Ambulatory Visit: Payer: Self-pay

## 2020-08-25 ENCOUNTER — Ambulatory Visit: Payer: No Typology Code available for payment source | Admitting: Physical Therapy

## 2020-08-25 DIAGNOSIS — R600 Localized edema: Secondary | ICD-10-CM | POA: Diagnosis not present

## 2020-08-25 DIAGNOSIS — M6281 Muscle weakness (generalized): Secondary | ICD-10-CM

## 2020-08-25 DIAGNOSIS — M25562 Pain in left knee: Secondary | ICD-10-CM

## 2020-08-25 DIAGNOSIS — R2689 Other abnormalities of gait and mobility: Secondary | ICD-10-CM

## 2020-08-25 NOTE — Addendum Note (Signed)
Addended by: Larey Days on: 08/25/2020 02:02 PM   Modules accepted: Orders

## 2020-08-25 NOTE — Therapy (Addendum)
[place], [place], [address] Phone: [phone]   Fax:  [phone]  Physical Therapy Treatment / Re-certification  Patient Details  Name: Kayla Miles MRN: [id number] Date of Birth: 04/09/1951 Referring Provider (PT): Jean Rosenthal MD   Encounter Date: 08/25/2020   PT End of Session - 08/25/20 1025    Visit Number 23    Number of Visits 29    Date for PT Re-Evaluation 09/14/20    Authorization Type VA    Authorization Time Period 07/23/2020 - 11/18/2020    Authorization - Visit Number 8    Authorization - Number of Visits 15    Progress Note Due on Visit 24    PT Start Time 7106    PT Stop Time 1105    PT Time Calculation (min) 42 min    Activity Tolerance Patient tolerated treatment well    Behavior During Therapy [facility] for tasks assessed/performed           Past Medical History:  Diagnosis Date  . Arthralgia of knee, right   . Arthritis    left knee osteoarthritis   . Breast cancer ([place]) 03/26/14   Left  . HTN (hypertension)   . Hypothyroidism   . Pre-diabetes    05/15/20 - per pt, insisted that she was not diabetic or pre diabetic. pt does not check sugars at home and has never been diagnosed    Past Surgical History:  Procedure Laterality Date  . BREAST IMPLANT EXCHANGE Bilateral 05/03/2018  . MASTECTOMY Bilateral 2015   with reconstruction.   . TONSILLECTOMY    . TOTAL KNEE ARTHROPLASTY Right 05/29/2018  . TOTAL KNEE ARTHROPLASTY Right 05/29/2018   Procedure: RIGHT  TOTAL KNEE ARTHROPLASTY;  Surgeon: [name], MD;  Location: [place];  Service: Orthopedics;  Laterality: Right;  . TOTAL KNEE ARTHROPLASTY Left 05/19/2020   Procedure: LEFT TOTAL KNEE ARTHROPLASTY;  Surgeon: [name], MD;  Location: [place];  Service: Orthopedics;  Laterality: Left;    There were no vitals filed for this visit.   Subjective Assessment - 08/25/20 1025     Subjective No pain. Just pain with getting up after sitting. Sometimes have sharp, shooting pains in incision site.    Currently in Pain? No/denies              [facility] PT Assessment - 08/25/20 0001      Observation/Other Assessments   Focus on Therapeutic Outcomes (FOTO)  37% limited       AROM   Left Knee Extension 13    Left Knee Flexion 108                         [facility] Adult PT Treatment/Exercise - 08/25/20 0001      Knee/Hip Exercises: Stretches   Active Hamstring Stretch 3 reps;30 seconds    Active Hamstring Stretch Limitations supine with strap     Quad Stretch Limitations 3 x 30 sec with strap       Knee/Hip Exercises: Aerobic   Elliptical L1 ramp1 x  3 minutes total LE only ;  HR 124, then elevated to 132 at 3 minutes     Recumbent Bike L3 x 6 min    lowered seat at 3 min to promote flexion     Knee/Hip Exercises: Machines for Strengthening   Cybex Knee Extension 2x 12 10# single     Cybex Knee Flexion 2 x  12 20# LLE only    Total Gym Leg Press Seated Leg press 65# bilat  2 x 25       Knee/Hip Exercises: Standing   Forward Step Up 10 reps;Hand Hold: 1;Step Height: 8"   2 sets   Step Down --    Step Down Limitations attempted 4inch step down, too difficult, able to complete single leg squat reaching opp foot forward     Functional Squat 20 reps   with bil HHA from free motion                   PT Short Term Goals - 07/15/20 1207      PT SHORT TERM GOAL #1   Title pt to be I with inital HEP    Period Weeks    Status Achieved      PT SHORT TERM GOAL #2   Title pt to increase L knee AROM to >/= 10 - 90 degrees for therapuetic progression    Baseline 15 - 102 on 8/4    Period Weeks    Status Partially Met             PT Long Term Goals - 08/25/20 1131      PT LONG TERM GOAL #1   Title Patient to demonstrate functional muscle strength as being MMT 5/5 in order to improve mechanics and reduce pain     Time 8    Period Weeks     Status On-going      PT LONG TERM GOAL #2   Title Patient to be able to reciprocally ascend/descend full flight of stairs with U railing, reciprocal pattern, good eccenric control in order to improve home/community access     Baseline poor eccentric strength/control    Time 8    Period Weeks    Status On-going      PT LONG TERM GOAL #3   Title pt to be able to walk/ stand >/= 60 min with LRAD for community ambulation and assist with work related tasks.    Baseline has not attempted this    Time 8    Period Weeks    Status On-going      PT LONG TERM GOAL #4   Title increase FOTO score to </= 30% limited to demo improvement in function    Baseline 37% limited , improved    Time 8    Period Weeks    Status On-going      PT LONG TERM GOAL #5   Title pt to be I with all HEP given to maintain and progress current level of function    Time 8    Period Weeks    Status On-going                 Plan - 08/25/20 1100    Clinical Impression Statement Per FOTO reports difficulty remains with walking and standing tolerance. Pt is not currently walking on her own, though is active. Began Elliptical and she could complete 3 minutes prior to fatigue/elevated HR. Attempted closed chain eccentric squat with inablility to perform 4 inch step down. Began single leg suat with opp LE reach and this was better tolerated. She would benefit from continued physical therapy to work on general knee ROM, overal functional mobility and work toward independent exercise.    PT Frequency 2x / week    PT Duration 3 weeks    PT Treatment/Interventions ADLs/Self Care Home Management;Cryotherapy;Electrical Stimulation;Iontophoresis 4mg /ml Dexamethasone;Moist  Heat;Ultrasound;Gait training;Therapeutic exercise;Therapeutic activities;Neuromuscular re-education;Balance training;Passive range of motion;Taping;Vasopneumatic Device;Patient/family education    PT Next Visit Plan functional strengthening, edema mgmt  prn, gait, vaso, eccentric stairs or single leg squat with reach - continue 3 minutes on elliptical    PT Home Exercise Plan [id number] - seated and supine hamstring stretch, supine and seated heel slide, quad set with towel under knee, sidelying hip abduction, prone knee hang, walking with metronome (app from phone).           Patient will benefit from skilled therapeutic intervention in order to improve the following deficits and impairments:  Improper body mechanics, Decreased strength, Increased muscle spasms, Abnormal gait, Postural dysfunction, Decreased range of motion, Decreased activity tolerance, Decreased balance, Decreased endurance, Pain, Increased edema  Visit Diagnosis: Localized edema  Chronic pain of left knee  Muscle weakness (generalized)  Other abnormalities of gait and mobility     Problem List Patient Active Problem List   Diagnosis Date Noted  . 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 Status post total right knee replacement 07/12/2018  . Primary osteoarthritis of left knee 04/11/2018  . Unilateral primary osteoarthritis, right knee 03/07/2018  . Chronic pain of right knee 03/07/2018  . Chronic pain of left knee 03/07/2018  . Malignant neoplasm of central portion of left female breast ([place]) 04/16/2014    Dorene Ar, PTA 08/25/2020, 11:34 AM  [place], [place], [address] Phone: [phone]   Fax:  [phone]  Name: Kayla Miles MRN: [id number] Date of Birth: 1951/08/28  Starr Lake PT, DPT, LAT, ATC  08/25/20  1:57 PM

## 2020-08-27 ENCOUNTER — Ambulatory Visit: Payer: No Typology Code available for payment source | Admitting: Physical Therapy

## 2020-09-01 ENCOUNTER — Ambulatory Visit: Payer: No Typology Code available for payment source | Admitting: Physical Therapy

## 2020-09-01 ENCOUNTER — Other Ambulatory Visit: Payer: Self-pay

## 2020-09-01 ENCOUNTER — Encounter: Payer: Self-pay | Admitting: Physical Therapy

## 2020-09-01 DIAGNOSIS — M25661 Stiffness of right knee, not elsewhere classified: Secondary | ICD-10-CM

## 2020-09-01 DIAGNOSIS — R2689 Other abnormalities of gait and mobility: Secondary | ICD-10-CM

## 2020-09-01 DIAGNOSIS — R600 Localized edema: Secondary | ICD-10-CM

## 2020-09-01 DIAGNOSIS — M6281 Muscle weakness (generalized): Secondary | ICD-10-CM

## 2020-09-01 DIAGNOSIS — M25562 Pain in left knee: Secondary | ICD-10-CM

## 2020-09-01 NOTE — Therapy (Addendum)
[place], [place], [address] Phone: [phone]   Fax:  [phone]  Physical Therapy Treatment / discharge  Patient Details  Name: Kayla Miles MRN: [id number] Date of Birth: 1951-10-01 Referring Provider (PT): Jean Rosenthal MD   Encounter Date: 09/01/2020   PT End of Session - 09/01/20 1022    Visit Number 24    Number of Visits 29    Date for PT Re-Evaluation 09/14/20    [facility]ization Type [facility]    Authorization Time Period 07/23/2020 - 11/18/2020    Authorization - Visit Number 9    Authorization - Number of Visits 15    PT Start Time 6720    PT Stop Time 1100    PT Time Calculation (min) 45 min           Past Medical History:  Diagnosis Date  . Arthralgia of knee, right   . Arthritis    left knee osteoarthritis   . Breast cancer ([place]) 03/26/14   Left  . HTN (hypertension)   . Hypothyroidism   . Pre-diabetes    05/15/20 - per pt, insisted that she was not diabetic or pre diabetic. pt does not check sugars at home and has never been diagnosed    Past Surgical History:  Procedure Laterality Date  . BREAST IMPLANT EXCHANGE Bilateral 05/03/2018  . MASTECTOMY Bilateral 2015   with reconstruction.   . TONSILLECTOMY    . TOTAL KNEE ARTHROPLASTY Right 05/29/2018  . TOTAL KNEE ARTHROPLASTY Right 05/29/2018   Procedure: RIGHT  TOTAL KNEE ARTHROPLASTY;  Surgeon: [name], MD;  Location: [facility];  Service: Orthopedics;  Laterality: Right;  . TOTAL KNEE ARTHROPLASTY Left 05/19/2020   Procedure: LEFT TOTAL KNEE ARTHROPLASTY;  Surgeon: [name], MD;  Location: [facility];  Service: Orthopedics;  Laterality: Left;    There were no vitals filed for this visit.       [facility] PT Assessment - 09/01/20 0001      Strength   Left Knee Flexion 4/5    Left Knee Extension 4/5                         [facility] Adult PT Treatment/Exercise - 09/01/20 0001       Knee/Hip Exercises: Stretches   Active Hamstring Stretch --    Active Hamstring Stretch Limitations --    Quad Stretch Limitations --    Other Knee/Hip Stretches --      Knee/Hip Exercises: Aerobic   Recumbent Bike L3 x 6 min    lowered seat at 3 min to promote flexion     Knee/Hip Exercises: Machines for Strengthening   Cybex Knee Extension 2x 12 10# single     Cybex Knee Flexion 2 x 12 20# LLE only    Total Gym Leg Press Seated Leg press 65# bilat  2 x 25       Knee/Hip Exercises: Standing   SLS needs light touch     Gait Training up and down 16 stairs with 1 UE, improved eccentic contorl, 4 inch step down/retro step up x 15 bilat UE support     Other Standing Knee Exercises single leg squat-sliding towel back with opp toe                     PT Short Term Goals - 09/01/20 1028      PT SHORT TERM GOAL #1  Title pt to be I with inital HEP    Time 3    Period Weeks    Status Achieved    Target Date 06/15/20      PT SHORT TERM GOAL #2   Title pt to increase L knee AROM to >/= 10 - 90 degrees for therapuetic progression    Baseline 12-110    Time 3    Period Weeks    Status Achieved    Target Date 06/15/20             PT Long Term Goals - 09/01/20 1052      PT LONG TERM GOAL #1   Title Patient to demonstrate functional muscle strength as being MMT 5/5 in order to improve mechanics and reduce pain     Baseline 4/5    Time 8    Period Weeks    Status Not Met      PT LONG TERM GOAL #2   Title Patient to be able to reciprocally ascend/descend full flight of stairs with U railing, reciprocal pattern, good eccenric control in order to improve home/community access     Time 8    Period Weeks    Status Achieved      PT LONG TERM GOAL #3   Title pt to be able to walk/ stand >/= 60 min with LRAD for community ambulation and assist with work related tasks.    Baseline hours    Time 8    Period Weeks    Status Achieved      PT LONG TERM GOAL #4    Title increase FOTO score to </= 30% limited to demo improvement in function    Time 8    Period Weeks    Status Not Met      PT LONG TERM GOAL #5   Title pt to be I with all HEP given to maintain and progress current level of function    Time 8    Period Weeks    Status Achieved                 Plan - 09/01/20 1117    Clinical Impression Statement Pt arrives after canceling her last appointment and admits to almost canceling this appointment. Discussed potential DC to HEP and pt was agreeable. She will continue to work on strenght, endurance and ROM.    PT Next Visit Plan Dc to HEP today    PT Home Exercise Plan [id number] - seated and supine hamstring stretch, supine and seated heel slide, quad set with towel under knee, sidelying hip abduction, prone knee hang, walking with metronome (app from phone).           Patient will benefit from skilled therapeutic intervention in order to improve the following deficits and impairments:  Improper body mechanics, Decreased strength, Increased muscle spasms, Abnormal gait, Postural dysfunction, Decreased range of motion, Decreased activity tolerance, Decreased balance, Decreased endurance, Pain, Increased edema  Visit Diagnosis: Localized edema  Muscle weakness (generalized)  Chronic pain of left knee  Other abnormalities of gait and mobility  Stiffness of right knee, not elsewhere classified     Problem List Patient Active Problem List   Diagnosis Date Noted  . 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 Status post total right knee replacement 07/12/2018  . Primary osteoarthritis of left knee 04/11/2018  . Unilateral primary osteoarthritis, right knee 03/07/2018  . Chronic pain of right knee 03/07/2018  . Chronic pain  of left knee 03/07/2018  . Malignant neoplasm of central portion of left female breast ([place]) 04/16/2014    Dorene Ar, PTA 09/01/2020, 11:24 AM  [facility] [address] [place], [place], [address] Phone: [phone]   Fax:  [phone]  Name: Jatavia Keltner MRN: [id number] Date of Birth: 1951-04-12     PHYSICAL THERAPY DISCHARGE SUMMARY  Visits from Start of Care: 24  Current functional level related to goals / functional outcomes: See goals , FOTO 63% limited   Remaining deficits: See assessment   Education / Equipment: HEP, theraband, posture, lifting mechanics,   Plan: Patient agrees to discharge.  Patient goals were partially met. Patient is being discharged due to being pleased with the current functional level.  ?????         Kristoffer Leamon PT, DPT, LAT, ATC  09/02/20  1:08 PM

## 2020-09-03 ENCOUNTER — Ambulatory Visit: Payer: No Typology Code available for payment source | Admitting: Physical Therapy

## 2020-09-08 ENCOUNTER — Ambulatory Visit: Payer: No Typology Code available for payment source | Admitting: Physical Therapy

## 2020-09-10 ENCOUNTER — Ambulatory Visit: Payer: No Typology Code available for payment source | Admitting: Physical Therapy

## 2020-09-14 ENCOUNTER — Encounter: Payer: No Typology Code available for payment source | Admitting: Physical Therapy

## 2020-10-02 ENCOUNTER — Telehealth: Payer: Self-pay | Admitting: Orthopaedic Surgery

## 2020-10-02 NOTE — Telephone Encounter (Signed)
FYI

## 2020-10-02 NOTE — Telephone Encounter (Signed)
Patient called. She would like Dr. Ninfa Linden to know that she woke up with a bruise on her knee. I offered her an appointment for next week, she said she will wait until her November appointment but thought he should know. Her call back number is [phone]

## 2020-10-28 ENCOUNTER — Ambulatory Visit (INDEPENDENT_AMBULATORY_CARE_PROVIDER_SITE_OTHER): Payer: No Typology Code available for payment source

## 2020-10-28 ENCOUNTER — Ambulatory Visit (INDEPENDENT_AMBULATORY_CARE_PROVIDER_SITE_OTHER): Payer: No Typology Code available for payment source | Admitting: Orthopaedic Surgery

## 2020-10-28 ENCOUNTER — Encounter: Payer: Self-pay | Admitting: Orthopaedic Surgery

## 2020-10-28 DIAGNOSIS — Z96652 Presence of left artificial knee joint: Secondary | ICD-10-CM

## 2020-10-28 NOTE — Progress Notes (Signed)
The patient is 5 months status post a left total knee arthroplasty.  We actually replaced her right knee in 2019.  Both of these have press-fit implants.  She is an active 69 years old.  She says that knee has swollen more since surgery compared to the other knee at the same time 5 months postoperative.  She is concerned about this.  She says she lacks full extension by few degrees.  On exam she does have still some swelling of the knee which is certainly to be expected after knee replacement surgery.  Some people swell more than others.  She does lack full extension by about 30 degrees and I can flex her to about 100 degrees.  2 views of the knee show well-seated implant on the left side and I do not see any complicating features yet.  There is a small sclerotic rim at the heel of the tibia but she has a son the other side.  When she stands her knees are straight.  There is no ligamentous instability on my exam.  I gave her reassurance the thus far on my exam and x-rays that the knees are looking okay.  Her right knee is still done wonderful.  I would like to reevaluate her in 3 months to see how she is doing overall and hopefully the swelling will have decreased.  I would like a standing AP and lateral of the left knee at that visit as well.

## 2021-01-27 ENCOUNTER — Ambulatory Visit (INDEPENDENT_AMBULATORY_CARE_PROVIDER_SITE_OTHER): Payer: No Typology Code available for payment source

## 2021-01-27 ENCOUNTER — Ambulatory Visit (INDEPENDENT_AMBULATORY_CARE_PROVIDER_SITE_OTHER): Payer: No Typology Code available for payment source | Admitting: Orthopaedic Surgery

## 2021-01-27 DIAGNOSIS — Z96652 Presence of left artificial knee joint: Secondary | ICD-10-CM

## 2021-01-27 NOTE — Progress Notes (Signed)
The patient is now between 8 and 9 months status post a left total knee arthroplasty.  She is 2 years out from her right knee being replaced.  She says she is doing very well and she is pushing herself through motion at the gym even trying weights on her knee.  She is reports pretty good strength and range of motion.  She has no issues with her right knee.  She still reports left knee swelling and understands that it will take a while to get the swelling to subside like it did with her other knee.  On my exam today the knee is ligamentously stable with examining her left more recent operative knee.  There is swelling to be expected.  She has good range of motion.  New graft 2 views of the left knee show well-seated press-fit total knee arthroplasty with no complicating features.  She will continue her normal activities in terms of how she is working out the gym for strengthening these knees.  All questions and concerns were answered and addressed.  Follow-up at this point can be as needed.

## 2021-03-31 ENCOUNTER — Encounter: Payer: Self-pay | Admitting: Orthopaedic Surgery

## 2021-03-31 ENCOUNTER — Ambulatory Visit (INDEPENDENT_AMBULATORY_CARE_PROVIDER_SITE_OTHER): Payer: No Typology Code available for payment source | Admitting: Orthopaedic Surgery

## 2021-03-31 DIAGNOSIS — Z96652 Presence of left artificial knee joint: Secondary | ICD-10-CM | POA: Diagnosis not present

## 2021-03-31 NOTE — Progress Notes (Signed)
The patient comes in today at 10 months status post a right total knee arthroplasty.  The right knee is doing well for her but still has some swelling.  She lacks full extension by only a few degrees.  She has been concerned about the perception that her left ankle rolled and slightly.  When she stands up without shoes it does rolling slightly but she can perform toe raise easily on both sides.  The posterior tibial tendon is intact on the right side and the left side.  Her ankle moves normally.  I gave her reassurance that the tendons themselves are intact.  I think as time goes by and her swelling goes down her knee and she gets stronger that this will subside.  If it worsens in any way she will let us know.  All question concerns were answered and addressed.  Follow-up can be as needed.

## 2021-08-03 ENCOUNTER — Telehealth: Payer: Self-pay | Admitting: Orthopaedic Surgery

## 2021-08-03 NOTE — Telephone Encounter (Signed)
Pt called requesting an left ankle brace. Pt states she had surgery with Dr. Ninfa Linden and her ankle keeps turning inward and need the brace to straighten foor out. Pt states [facility] and they stated she needed to get the brace from Dr. Ninfa Linden office. Pt is asking if brace can be picked up from our office or will it be sent to pharmacy to be picked up. Please call pt about this matter at [phone].

## 2021-08-04 NOTE — Telephone Encounter (Signed)
LMOM for patient of the below message  

## 2021-08-19 ENCOUNTER — Ambulatory Visit (INDEPENDENT_AMBULATORY_CARE_PROVIDER_SITE_OTHER): Payer: No Typology Code available for payment source | Admitting: Physician Assistant

## 2021-08-19 ENCOUNTER — Encounter: Payer: Self-pay | Admitting: Physician Assistant

## 2021-08-19 DIAGNOSIS — M76822 Posterior tibial tendinitis, left leg: Secondary | ICD-10-CM

## 2021-08-19 NOTE — Progress Notes (Signed)
HPI: Kayla Miles comes in today due to continued rolling of her left ankle.  She is not actually rolling the ankle and causing herself to almost fall and she has no pain.  She does feels that the left ankle will Zhang compared to the right.  This been since undergoing left total knee arthroplasty.  She is asking for a brace for her ankle.  Review of systems see HPI otherwise negative or noncontributory.  Physical exam: General well-developed well-nourished female no acute distress. Bilateral feet: Dorsal pedal pulses are 2+.  There is no rashes skin lesions ulcerations or impending ulcers.  She has bilateral pes planus.  Left greater than right.  She is able to do a single heel raise bilaterally but has slight difficulty performing this multiple repetitions on the left compared to the right.  Full dorsiflexion plantarflexion bilateral ankles.  Inversion eversion bilateral feet reveal 5 out of 5 strengths.  She has no tenderness over the posterior tibial tendons peroneal tendons of either ankle.  No tenderness over the Achilles bilaterally.  Nontender of the medial tubercle of the calcaneus bilaterally.  There is no significant swelling of either ankle.  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  She will obtain off-the-shelf medial arch supports.  She will work on posterior tibial tendon strengthening exercises shown today.  Follow-up with [place] as needed.  Questions were encouraged and answered.

## 2022-10-31 ENCOUNTER — Encounter: Payer: Self-pay | Admitting: Orthopaedic Surgery

## 2022-10-31 ENCOUNTER — Ambulatory Visit (INDEPENDENT_AMBULATORY_CARE_PROVIDER_SITE_OTHER): Payer: No Typology Code available for payment source

## 2022-10-31 ENCOUNTER — Ambulatory Visit (INDEPENDENT_AMBULATORY_CARE_PROVIDER_SITE_OTHER): Payer: No Typology Code available for payment source | Admitting: Orthopaedic Surgery

## 2022-10-31 DIAGNOSIS — Z96652 Presence of left artificial knee joint: Secondary | ICD-10-CM | POA: Diagnosis not present

## 2022-10-31 DIAGNOSIS — G8929 Other chronic pain: Secondary | ICD-10-CM

## 2022-10-31 DIAGNOSIS — M25562 Pain in left knee: Secondary | ICD-10-CM

## 2022-10-31 NOTE — Progress Notes (Signed)
The patient is well-known to me.  We replaced her right knee in 2019 and her left knee 2 years ago in 2021.  She is an active 71 year old female.  She does report left knee soreness mainly with driving and some anterior pain.  She says she cannot squat down on both of her knees.  On exam both knees have good range of motion.  Her left knee does have some slight laxity on motion compared with her right operative knee.  I did review the operative notes and her polyliner is actually 1 mm thicker on the left than the right side.  There is no effusion.  There is no significant warmth.  An AP and lateral left knee shows a well-seated total knee arthroplasty with no complicating features.  I would like to send her to outpatient physical therapy specifically for strengthening the quads on both knees and any modalities that can decrease her pain.  Hopefully this will help subside things rather than thinking about the possibility of upsizing her polyliner.  She agrees to try some therapy as well.

## 2022-11-01 ENCOUNTER — Other Ambulatory Visit: Payer: Self-pay

## 2022-11-01 DIAGNOSIS — Z96652 Presence of left artificial knee joint: Secondary | ICD-10-CM

## 2022-11-01 DIAGNOSIS — G8929 Other chronic pain: Secondary | ICD-10-CM

## 2022-11-01 NOTE — Therapy (Incomplete)
OUTPATIENT PHYSICAL THERAPY EVALUATION   Patient Name: Kayla Miles MRN: [id number] DOB:1951-11-28, 71 y.o., female Today's Date: 11/01/2022  END OF SESSION:   Past Medical History:  Diagnosis Date   Arthralgia of knee, right    Arthritis    left knee osteoarthritis    Breast cancer ([place]) 03/26/14   Left   HTN (hypertension)    Hypothyroidism    Pre-diabetes    05/15/20 - per pt, insisted that she was not diabetic or pre diabetic. pt does not check sugars at home and has never been diagnosed   Past Surgical History:  Procedure Laterality Date   BREAST IMPLANT EXCHANGE Bilateral 05/03/2018   MASTECTOMY Bilateral 2015   with reconstruction.    TONSILLECTOMY     TOTAL KNEE ARTHROPLASTY Right 05/29/2018   TOTAL KNEE ARTHROPLASTY Right 05/29/2018   Procedure: RIGHT  TOTAL KNEE ARTHROPLASTY;  Surgeon: [name], MD;  Location: [facility];  Service: Orthopedics;  Laterality: Right;   TOTAL KNEE ARTHROPLASTY Left 05/19/2020   Procedure: LEFT TOTAL KNEE ARTHROPLASTY;  Surgeon: [name], MD;  Location: [place];  Service: Orthopedics;  Laterality: Left;   Patient Active Problem List   Diagnosis Date Noted   Status post total left knee replacement 05/19/2020   Status post total right knee replacement 07/12/2018   Primary osteoarthritis of left knee 04/11/2018   Unilateral primary osteoarthritis, right knee 03/07/2018   Chronic pain of right knee 03/07/2018   Chronic pain of left knee 03/07/2018   Malignant neoplasm of central portion of left female breast ([place]) 04/16/2014    PCP: Henreitta Cea MD  REFERRING PROVIDER: [name], MD  REFERRING DIAG: [phone] (ICD-10-CM) - Status post total left knee replacement M25.562,G89.29 (ICD-10-CM) - Chronic pain of left knee  THERAPY DIAG:  No diagnosis found.  Rationale for Evaluation and Treatment: Rehabilitation  ONSET DATE: ***  SUBJECTIVE:   SUBJECTIVE STATEMENT: ***  PERTINENT  HISTORY: Rt TKA 05/29/2018 , Lt TKA 05/19/2020 PAIN:  NPRS scale: ***/10 Pain location: bilateral knee Pain description: *** Aggravating factors: *** Relieving factors: ***  PRECAUTIONS: None  WEIGHT BEARING RESTRICTIONS: No  FALLS:  Has patient fallen in last 6 months? No  LIVING ENVIRONMENT: Lives with: {OPRC lives with:25569::"lives with their family"} Lives in: {Lives in:[address]} Stairs: {opstairs:27293} Has following equipment at home: {Assistive devices:23999}  OCCUPATION: ***  PLOF: Independent  PATIENT GOALS: ***  Next MD visit:   OBJECTIVE:   PATIENT SURVEYS:  11/02/2022 FOTO intake:    predicted:    COGNITION: 11/02/2022 Overall cognitive status: WFL    SENSATION: 11/02/2022 {sensation:27233}  EDEMA:  11/02/2022 {edema:24020}  MUSCLE LENGTH: 11/02/2022 Hamstrings: Right *** deg; Left *** deg Marcello Moores test: Right *** deg; Left *** deg  POSTURE:  11/02/2022 {posture:25561}  PALPATION: 11/02/2022 ***  LOWER EXTREMITY ROM:   ROM Right 11/02/2022 Left 11/02/2022  Hip flexion    Hip extension    Hip abduction    Hip adduction    Hip internal rotation    Hip external rotation    Knee flexion    Knee extension    Ankle dorsiflexion    Ankle plantarflexion    Ankle inversion    Ankle eversion     (Blank rows = not tested)  LOWER EXTREMITY MMT:  MMT Right 11/02/2022 Left 11/02/2022  Hip flexion    Hip extension    Hip abduction    Hip adduction    Hip internal rotation    Hip external rotation  Knee flexion    Knee extension    Ankle dorsiflexion    Ankle plantarflexion    Ankle inversion    Ankle eversion     (Blank rows = not tested)  LOWER EXTREMITY SPECIAL TESTS:  11/02/2022 {LEspecialtests:26242}  FUNCTIONAL TESTS:  11/02/2022 18 inch chair transfer: Lt SLS: Rt SLS:  GAIT: 11/02/2022 Distance walked: *** Assistive device utilized: {Assistive devices:23999} Level of assistance: {Levels of  assistance:24026} Comments: ***   TODAY'S TREATMENT                                                                          DATE: 11/02/2022 Therex:    HEP instruction/performance c cues for techniques, handout provided.  Trial set performed of each for comprehension and symptom assessment.  See below for exercise list  PATIENT EDUCATION:  11/02/2022 Education details: HEP, POC Person educated: Patient Education method: Consulting civil engineer, Demonstration, Verbal cues, and Handouts Education comprehension: verbalized understanding, returned demonstration, and verbal cues required  HOME EXERCISE PROGRAM: ***  ASSESSMENT:  CLINICAL IMPRESSION: Patient is a 71 y.o. who comes to clinic with complaints of ***pain with mobility, strength and movement coordination deficits that impair their ability to perform usual daily and recreational functional activities without increase difficulty/symptoms at this time.  Patient to benefit from skilled PT services to address impairments and limitations to improve to previous level of function without restriction secondary to condition.   OBJECTIVE IMPAIRMENTS: {opptimpairments:25111}.   ACTIVITY LIMITATIONS: {activitylimitations:27494}  PARTICIPATION LIMITATIONS: {participationrestrictions:25113}  PERSONAL FACTORS: {Personal factors:25162} are also affecting patient's functional outcome.   REHAB POTENTIAL: Fair to good  CLINICAL DECISION MAKING: Stable/uncomplicated  EVALUATION COMPLEXITY: Low   GOALS: Goals reviewed with patient? Yes  SHORT TERM GOALS: (target date for Short term goals are 3 weeks 11/23/2022)   1.  Patient will demonstrate independent use of home exercise program to maintain progress from in clinic treatments.  Goal status: New  LONG TERM GOALS: (target dates for all long term goals are 10 weeks  01/11/2023 )   1. Patient will demonstrate/report pain at worst less than or equal to 2/10 to facilitate minimal limitation in daily  activity secondary to pain symptoms.  Goal status: New   2. Patient will demonstrate independent use of home exercise program to facilitate ability to maintain/progress functional gains from skilled physical therapy services.  Goal status: New   3. Patient will demonstrate FOTO outcome > or = *** % to indicate reduced disability due to condition.  Goal status: New   4.  Patient will demonstrate *** LE MMT 5/5 throughout to faciltiate usual transfers, stairs, squatting at [facility] for daily life.   Goal status: New   5.  Patient will demonstrate Goal status: New   6.  *** Goal status: New   7.  *** Goal Status: New   PLAN:  PT FREQUENCY: 1-2x/week  PT DURATION: 10 weeks  PLANNED INTERVENTIONS: Therapeutic exercises, Therapeutic activity, Neuro Muscular re-education, Balance training, Gait training, Patient/Family education, Joint mobilization, Stair training, DME instructions, Dry Needling, Electrical stimulation, Traction, Cryotherapy, vasopneumatic deviceMoist heat, Taping, Ultrasound, Ionotophoresis '4mg'$ /ml Dexamethasone, and Manual therapy.  All included unless contraindicated  PLAN FOR NEXT SESSION: Review HEP knowledge/results.    Scot Jun, PT, DPT,  OCS, ATC 11/02/22  8:36 AM

## 2022-11-02 ENCOUNTER — Ambulatory Visit (INDEPENDENT_AMBULATORY_CARE_PROVIDER_SITE_OTHER): Payer: No Typology Code available for payment source | Admitting: Rehabilitative and Restorative Service Providers"

## 2022-11-02 ENCOUNTER — Encounter: Payer: Self-pay | Admitting: Rehabilitative and Restorative Service Providers"

## 2022-11-02 DIAGNOSIS — M25662 Stiffness of left knee, not elsewhere classified: Secondary | ICD-10-CM

## 2022-11-02 DIAGNOSIS — M6281 Muscle weakness (generalized): Secondary | ICD-10-CM | POA: Diagnosis not present

## 2022-11-02 DIAGNOSIS — R262 Difficulty in walking, not elsewhere classified: Secondary | ICD-10-CM

## 2022-11-02 NOTE — Therapy (Signed)
OUTPATIENT PHYSICAL THERAPY LOWER EXTREMITY EVALUATION   Patient Name: Kayla Miles MRN: [id number] DOB:[birth year], 71 y.o., female Today's Date: 11/02/2022  END OF SESSION:  PT End of Session - 11/02/22 1525     Visit Number 1    Number of Visits 8    Authorization Type [facility]    Authorization Time Period 10/11/2022 - 04/09/2023    Progress Note Due on Visit 8    PT Start Time 1430    PT Stop Time 1512    PT Time Calculation (min) 42 min    Equipment Utilized During Treatment Gait belt    Activity Tolerance Patient tolerated treatment well;No increased pain    Behavior During Therapy [facility] for tasks assessed/performed             Past Medical History:  Diagnosis Date   Arthralgia of knee, right    Arthritis    left knee osteoarthritis    Breast cancer ([place]) 03/26/14   Left   HTN (hypertension)    Hypothyroidism    Pre-diabetes    05/15/20 - per pt, insisted that she was not diabetic or pre diabetic. pt does not check sugars at home and has never been diagnosed   Past Surgical History:  Procedure Laterality Date   BREAST IMPLANT EXCHANGE Bilateral 05/03/2018   MASTECTOMY Bilateral 2015   with reconstruction.    TONSILLECTOMY     TOTAL KNEE ARTHROPLASTY Right 05/29/2018   TOTAL KNEE ARTHROPLASTY Right 05/29/2018   Procedure: RIGHT  TOTAL KNEE ARTHROPLASTY;  Surgeon: [name], MD;  Location: [place];  Service: Orthopedics;  Laterality: Right;   TOTAL KNEE ARTHROPLASTY Left 05/19/2020   Procedure: LEFT TOTAL KNEE ARTHROPLASTY;  Surgeon: [name], MD;  Location: [place];  Service: Orthopedics;  Laterality: Left;   Patient Active Problem List   Diagnosis Date Noted   Status post total left knee replacement 05/19/2020   Status post total right knee replacement 07/12/2018   Primary osteoarthritis of left knee 04/11/2018   Unilateral primary osteoarthritis, right knee 03/07/2018   Chronic pain of right knee 03/07/2018   Chronic pain of left  knee 03/07/2018   Malignant neoplasm of central portion of left female breast ([place]) 04/16/2014    PCP: Henreitta Cea, MD  REFERRING PROVIDER: [name], MD  REFERRING DIAG: Diagnosis [phone] (ICD-10-CM) - Status post total left knee replacement M25.562,G89.29 (ICD-10-CM) - Chronic pain of left knee  THERAPY DIAG:  Difficulty in walking, not elsewhere classified  Stiffness of left knee, not elsewhere classified  Muscle weakness (generalized)  Rationale for Evaluation and Treatment: Rehabilitation  ONSET DATE: June 2022  SUBJECTIVE:   SUBJECTIVE STATEMENT: [name] reports she had a TKA in June of 2022.    PERTINENT HISTORY: HTN, hypothyroid, pre-diabetes, Rt TKA 05/29/2018 and Lt TKA 05/19/2020 PAIN:  Are you having pain? Yes: NPRS scale: 0-2/10 Pain location: Left knee stiffness Pain description: Stiffness Aggravating factors: Squatting Relieving factors: NA  PRECAUTIONS: None  WEIGHT BEARING RESTRICTIONS: No  FALLS:  Has patient fallen in last 6 months? 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  PLOF: Independent  PATIENT GOALS: Less stiffness, better strength, less discomfort  NEXT MD VISIT: 01/02/2023  OBJECTIVE:   DIAGNOSTIC FINDINGS: See chart, normal findings  PATIENT SURVEYS:  FOTO 58 (Goal 83 in 8 visits)  COGNITION:  Overall cognitive status: Within functional limits for tasks assessed     SENSATION: No complaints of peripheral pain or paresthesias  EDEMA:  Mild Bil   LOWER EXTREMITY ROM:  Active ROM Right eval Left eval  Hip flexion    Hip extension    Hip abduction    Hip adduction    Hip internal rotation    Hip external rotation    Knee flexion 108 102  Knee extension 0 0  Ankle dorsiflexion    Ankle plantarflexion    Ankle inversion    Ankle eversion      (Blank rows = not tested)  LOWER EXTREMITY MMT:  MMT Right eval Left eval  Hip flexion    Hip extension    Hip abduction    Hip adduction    Hip internal rotation    Hip external rotation    Knee flexion    Knee extension 61.6 pounds 56.7 pounds  Ankle dorsiflexion    Ankle plantarflexion    Ankle inversion    Ankle eversion     (Blank rows = not tested)  GAIT: Distance walked: In the office Assistive device utilized: None Level of assistance: Complete Independence Comments: No deviations noted.  Talyah is having problems with squatting and flexion.   TODAY'S TREATMENT:                                                                                                                              DATE: 11/02/2022 Tailgate knee flexion 1 minute Seated knee flexion AAROM 10 x 10 seconds Prone quadriceps stretch 5 x 20 seconds left only Sit to stand with slow eccentrics 5 times   PATIENT EDUCATION:  Education details: Reviewed exam findings and home exercise program Person educated: Patient Education method: Explanation, Demonstration, Tactile cues, Verbal cues, and Handouts Education comprehension: verbalized understanding, returned demonstration, verbal cues required, tactile cues required, and needs further education  HOME EXERCISE PROGRAM: Access Code: [email or web address] URL: [email or web address] Date: 11/02/2022 Prepared by: Vista Mink  Exercises - Seated Knee Flexion AAROM  - 3 x daily - 7 x weekly - 1 sets - 1 reps - 1 minutes hold - Prone Quadricep Stretch with Strap  - 3-5 x daily - 7 x weekly - 1 sets - 5-10 reps - 20 seconds hold - Sit to Stand with Armchair  - 5 x daily - 7 x weekly - 1 sets - 5 reps  ASSESSMENT:  CLINICAL IMPRESSION: Patient is a 71 y.o. female who was seen today for physical therapy evaluation and treatment for left knee stiffness.  [name] feels like she should be able to bend her left knee further than she can and she is  also concerned with the strength of both knees.  She does have bilateral knee stiffness and quadriceps weakness and this will be the focus of her supervised rehabilitation.  OBJECTIVE IMPAIRMENTS: Abnormal gait, decreased activity tolerance, decreased endurance, decreased knowledge of condition, difficulty walking, decreased  ROM, decreased strength, increased edema, and pain.   ACTIVITY LIMITATIONS: lifting, bending, squatting, stairs, and locomotion level  PARTICIPATION LIMITATIONS: community activity and occupation  PERSONAL FACTORS:  HTN, hypothyroid, pre-diabetes, and previous bilateral knee replacements  are also affecting patient's functional outcome.   REHAB POTENTIAL: Good  CLINICAL DECISION MAKING: Stable/uncomplicated  EVALUATION COMPLEXITY: Low   GOALS: Goals reviewed with patient? Yes  SHORT TERM GOALS: Target date: 11/30/2022 Improve left knee flexion active range of motion to 110 degrees Baseline: 102 degrees Goal status: INITIAL  2.  Dnyla will be independent with her day 1 home exercise program Baseline: Started 11/02/2022 Goal status: INITIAL   LONG TERM GOALS: Target date: 12/28/2022  Improved FOTO to 83 Baseline: 58 Goal status: INITIAL  2.  Improve bilateral quadriceps strength to at least 70 pounds Baseline: 56 to 62 pounds Goal status: INITIAL  3.  Railynn will be independent with a long-term home exercise program at discharge Baseline: Started 11/02/2022 Goal status: INITIAL  PLAN:  PT FREQUENCY: 1-2x/week  PT DURATION: 8 weeks  PLANNED INTERVENTIONS: Therapeutic exercises, Therapeutic activity, Neuromuscular re-education, Balance training, Gait training, Patient/Family education, Self Care, Stair training, Cryotherapy, Vasopneumatic device, and Manual therapy  PLAN FOR NEXT SESSION: Review home exercise program.  Emphasis on continued quadriceps strengthening and knee flexion active range of motion with functional activities like stairs and  balance.  Focus on activities she can do at home as she will only be attending once a week.   Farley Ly, PT, MPT 11/02/2022, 3:27 PM

## 2022-11-10 NOTE — Therapy (Addendum)
OUTPATIENT PHYSICAL THERAPY TREATMENT NOTE   Patient Name: Kayla Miles MRN: [id number] DOB:[birth year], 71 y.o., female Today's Date: 11/10/2022  END OF SESSION:    Past Medical History:  Diagnosis Date   Arthralgia of knee, right    Arthritis    left knee osteoarthritis    Breast cancer ([place]) 03/26/14   Left   HTN (hypertension)    Hypothyroidism    Pre-diabetes    05/15/20 - per pt, insisted that she was not diabetic or pre diabetic. pt does not check sugars at home and has never been diagnosed   Past Surgical History:  Procedure Laterality Date   BREAST IMPLANT EXCHANGE Bilateral 05/03/2018   MASTECTOMY Bilateral 2015   with reconstruction.    TONSILLECTOMY     TOTAL KNEE ARTHROPLASTY Right 05/29/2018   TOTAL KNEE ARTHROPLASTY Right 05/29/2018   Procedure: RIGHT  TOTAL KNEE ARTHROPLASTY;  Surgeon: [name], MD;  Location: [place];  Service: Orthopedics;  Laterality: Right;   TOTAL KNEE ARTHROPLASTY Left 05/19/2020   Procedure: LEFT TOTAL KNEE ARTHROPLASTY;  Surgeon: [name], MD;  Location: [place];  Service: Orthopedics;  Laterality: Left;   Patient Active Problem List   Diagnosis Date Noted   Status post total left knee replacement 05/19/2020   Status post total right knee replacement 07/12/2018   Primary osteoarthritis of left knee 04/11/2018   Unilateral primary osteoarthritis, right knee 03/07/2018   Chronic pain of right knee 03/07/2018   Chronic pain of left knee 03/07/2018   Malignant neoplasm of central portion of left female breast ([place]) 04/16/2014     THERAPY DIAG:  No diagnosis found.  PCP: Henreitta Cea, MD   REFERRING PROVIDER: [name], MD   REFERRING DIAG: Diagnosis [phone] (ICD-10-CM) - Status post total left knee replacement M25.562,G89.29 (ICD-10-CM) - Chronic pain of left knee   THERAPY DIAG:  Difficulty in walking, not elsewhere classified   Stiffness of left knee, not elsewhere  classified   Muscle weakness (generalized)   Rationale for Evaluation and Treatment: Rehabilitation   ONSET DATE: June 2022   SUBJECTIVE:    SUBJECTIVE STATEMENT: Pt states that she is having a lot of stiffness in her L knee. She also states that she has been having a lot of pain in her L heel. She is planning to follow up with Dr Ninfa Linden due to possible bone spur.    PERTINENT HISTORY: HTN, hypothyroid, pre-diabetes, Rt TKA 05/29/2018 and Lt TKA 05/19/2020 PAIN:  Are you having pain? Yes: NPRS scale: 0-2/10 Pain location: Left knee stiffness Pain description: Stiffness Aggravating factors: Squatting Relieving factors: NA   PRECAUTIONS: None   WEIGHT BEARING RESTRICTIONS: No   FALLS:  Has patient fallen in last 6 months? 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   PLOF: Independent   PATIENT GOALS: Less stiffness, better strength, less discomfort   NEXT MD VISIT: 01/02/2023   OBJECTIVE:    DIAGNOSTIC FINDINGS: See chart, normal findings   PATIENT SURVEYS:  FOTO 58 (Goal 83 in 8 visits)   COGNITION: Overall cognitive status: Within functional limits for tasks assessed                         SENSATION: No complaints of peripheral pain or paresthesias   EDEMA:  Mild  Bil     LOWER EXTREMITY ROM:   Active ROM Right eval Left eval  Hip flexion      Hip extension      Hip abduction      Hip adduction      Hip internal rotation      Hip external rotation      Knee flexion 108 102  Knee extension 0 0  Ankle dorsiflexion      Ankle plantarflexion      Ankle inversion      Ankle eversion       (Blank rows = not tested)   LOWER EXTREMITY MMT:   MMT Right eval Left eval  Hip flexion      Hip extension      Hip abduction      Hip adduction      Hip internal rotation      Hip external  rotation      Knee flexion      Knee extension 61.6 pounds 56.7 pounds  Ankle dorsiflexion      Ankle plantarflexion      Ankle inversion      Ankle eversion       (Blank rows = not tested)   GAIT: Distance walked: In the office Assistive device utilized: None Level of assistance: Complete Independence Comments: No deviations noted.  Kayla Miles is having problems with squatting and flexion.     TODAY'S TREATMENT:                        DATE: 11/11/2022 Nustep lvl 3, 5 min Tailgate knee flexion 1 minute Seated PROM into flexion with OP STM to assess tissues.  HS stretch  IT band stretch  Majority of session spent on discussing hypersensitivity with two PT's. Pt is very concerned so was taken downstairs to follow up with a PA.                                                                                                          DATE: 11/02/2022 Tailgate knee flexion 1 minute Seated knee flexion AAROM 10 x 10 seconds Prone quadriceps stretch 5 x 20 seconds left only Sit to stand with slow eccentrics 5 times     PATIENT EDUCATION:  Education details: Reviewed exam findings and home exercise program Person educated: Patient Education method: Explanation, Demonstration, Tactile cues, Verbal cues, and Handouts Education comprehension: verbalized understanding, returned demonstration, verbal cues required, tactile cues required, and needs further education   HOME EXERCISE PROGRAM: Access Code: [email or web address] URL: [email or web address] Date: 11/02/2022 Prepared by: Vista Mink   Exercises - Seated Knee Flexion AAROM  - 3 x daily - 7 x weekly - 1 sets - 1 reps - 1 minutes hold - Prone Quadricep Stretch with Strap  - 3-5 x daily - 7 x weekly - 1 sets - 5-10 reps - 20 seconds hold - Sit to Stand with Armchair  - 5 x daily - 7 x weekly - 1 sets - 5 reps   ASSESSMENT:  CLINICAL IMPRESSION: Pt presents to PT with continued stiffness in L knee. During session, PT  attempted to assess L knee mobility and soft tissue. Pt reports significant pain with light touch to her entire L and R LE. She states that she she noticed it last night when rubbing her legs. She denies any history of vascular issues or fibromyalgia. Pt was able to tolerate exercises, but majority of session was spent on discussing possible reasons for tenderness due to pt's request. Pt was taken downstairs to be seen by a PA to further diagnose new onset of hypersensitivity.    OBJECTIVE IMPAIRMENTS: Abnormal gait, decreased activity tolerance, decreased endurance, decreased knowledge of condition, difficulty walking, decreased ROM, decreased strength, increased edema, and pain.    ACTIVITY LIMITATIONS: lifting, bending, squatting, stairs, and locomotion level   PARTICIPATION LIMITATIONS: community activity and occupation   PERSONAL FACTORS:  HTN, hypothyroid, pre-diabetes, and previous bilateral knee replacements  are also affecting patient's functional outcome.    REHAB POTENTIAL: Good   CLINICAL DECISION MAKING: Stable/uncomplicated   EVALUATION COMPLEXITY: Low     GOALS: Goals reviewed with patient? Yes   SHORT TERM GOALS: Target date: 11/30/2022 Improve left knee flexion active range of motion to 110 degrees Baseline: 102 degrees Goal status: INITIAL   2.  Zaela will be independent with her day 1 home exercise program Baseline: Started 11/02/2022 Goal status: INITIAL     LONG TERM GOALS: Target date: 12/28/2022   Improved FOTO to 83 Baseline: 58 Goal status: INITIAL   2.  Improve bilateral quadriceps strength to at least 70 pounds Baseline: 56 to 62 pounds Goal status: INITIAL   3.  Signa will be independent with a long-term home exercise program at discharge Baseline: Started 11/02/2022 Goal status: INITIAL   PLAN:   PT FREQUENCY: 1-2x/week   PT DURATION: 8 weeks   PLANNED INTERVENTIONS: Therapeutic exercises, Therapeutic activity, Neuromuscular  re-education, Balance training, Gait training, Patient/Family education, Self Care, Stair training, Cryotherapy, Vasopneumatic device, and Manual therapy   PLAN FOR NEXT SESSION: Review home exercise program.  Emphasis on continued quadriceps strengthening and knee flexion active range of motion with functional activities like stairs and balance.  Focus on activities she can do at home as she will only be attending once a week.     Lynden Ang, PT 11/10/2022, 9:43 AM

## 2022-11-11 ENCOUNTER — Ambulatory Visit (INDEPENDENT_AMBULATORY_CARE_PROVIDER_SITE_OTHER): Payer: No Typology Code available for payment source | Admitting: Physical Therapy

## 2022-11-11 ENCOUNTER — Encounter: Payer: Self-pay | Admitting: Physical Therapy

## 2022-11-11 ENCOUNTER — Ambulatory Visit (INDEPENDENT_AMBULATORY_CARE_PROVIDER_SITE_OTHER): Payer: No Typology Code available for payment source | Admitting: Physician Assistant

## 2022-11-11 ENCOUNTER — Encounter: Payer: Self-pay | Admitting: Physician Assistant

## 2022-11-11 DIAGNOSIS — M79604 Pain in right leg: Secondary | ICD-10-CM

## 2022-11-11 DIAGNOSIS — M79605 Pain in left leg: Secondary | ICD-10-CM | POA: Diagnosis not present

## 2022-11-11 DIAGNOSIS — M25662 Stiffness of left knee, not elsewhere classified: Secondary | ICD-10-CM | POA: Diagnosis not present

## 2022-11-11 DIAGNOSIS — M6281 Muscle weakness (generalized): Secondary | ICD-10-CM | POA: Diagnosis not present

## 2022-11-11 DIAGNOSIS — R262 Difficulty in walking, not elsewhere classified: Secondary | ICD-10-CM

## 2022-11-11 NOTE — Progress Notes (Addendum)
Office Visit Note   Patient: Kayla Miles           Date of Birth: [birth year]           MRN: [id number] Visit Date: 11/11/2022              Requested by: Kayla Miles, [place],  Kayla Miles [id number] PCP: Kayla Cea, MD  No chief complaint on file.     HPI: Ms. Kayla Miles is an active fit pleasant 71 year old woman who is a patient of Dr. Trevor Miles.  She has had bilateral knee replacements in the last few years.  She saw Dr. Ninfa Miles a couple weeks ago Complaining of some soreness in her knees.  She was referred to physical therapy for strengthening.  I was approached by physical therapy today because the therapist said she could not touch the patient's knees without her having pain.  Asked if I would evaluate her.  She denies any fever or chills.  She said she has sensitivity to massage that goes from the inner part of her thighs down the inner part of her legs bilaterally.  She says this was going on when she saw Dr. Ninfa Miles and for quite a while before that but she forgot to mention it to them.  She denies any calf pain.  She denies any swelling in her legs.  She walks 3-4 times a week and denies any pain or fatigue at all when walking.  She has gotten occasional leg cramping in the past.  Denies any paresthesias any loss of bowel or bladder control she really has no pain and less massaged over the area of her inner thighs and lower legs. Assessment & Plan: Visit Diagnoses:  1. Bilateral leg pain     Plan: Sensitivity to palpation bilateral lower extremities in her legs and her calves but no sensation changes no strength issues no difficulties walking and cannot reproduce any pain with weightbearing.  Her exam today only shows sensitivity when massaging the inner sides of her knees or thighs.  This has been going on quite a while per the patient.  I do not think this is claudication as she can walk extensively without any difficulties.  She  does not have any pain with daily activities.  She does drink quite a bit of water question whether this could be an electrolyte imbalance.  Could this be a phlebitis again not likely given her presentation.  I have asked that she start taking ibuprofen 2 tablets with food 3 times a day.  She does have a follow-up appointment with Dr. Ninfa Miles.  I think it is fine for her to can continue physical therapy as she has no problem with the knees.  Will discuss this with Dr. Ninfa Miles for any further recommendations.  This has been going on for a while without any increasing severity.  However I did tell her if she had any exacerbation of her symptoms fever chills redness swelling increased pain she should be seen emergently over the weekend.  Follow-Up Instructions: Return in about 4 weeks (around 12/09/2022).   Ortho Exam  Patient is alert, oriented, no adenopathy, well-dressed, normal affect, normal respiratory effort. Patient appears well is in not distress ambulates with ease.  Bilateral knees no effusion no redness.  No redness or swelling in her legs.  Compartments are soft.  She has 5 out of 5 strength with dorsiflexion plantarflexion extension flexion of her legs.  Sensation is intact on the  inner and outer sides of her legs.  She does have tenderness to palpation on her inner thighs and into her legs.  This is the only way I can reproduce her pain.  Negative Homans' sign.  She has strong pulses distally in both of her legs  Imaging: No results found. No images are attached to the encounter.  Labs: Lab Results  Component Value Date   HGBA1C 6.3 (H) 05/15/2020     No results found for: "ALBUMIN", "PREALBUMIN", "CBC"  No results found for: "MG" No results found for: "VD25OH"  No results found for: "PREALBUMIN"    Latest Ref Rng & Units 05/20/2020    3:05 AM 05/15/2020    8:38 AM 03/04/2019   12:05 PM  CBC EXTENDED  WBC 4.0 - 10.5 K/uL 9.5  6.3  7.2   RBC 3.87 - 5.11 MIL/uL 4.97  5.45   5.77   Hemoglobin 12.0 - 15.0 g/dL 11.3  12.4  12.9   HCT 36.0 - 46.0 % 36.2  40.1  41.2   Platelets 150 - 400 K/uL 270  286  301      There is no height or weight on file to calculate BMI.  Orders:  No orders of the defined types were placed in this encounter.  No orders of the defined types were placed in this encounter.    Procedures: No procedures performed  Clinical Data: No additional findings.  ROS:  All other systems negative, except as noted in the HPI. Review of Systems  Objective: Vital Signs: There were no vitals taken for this visit.  Specialty Comments:  No specialty comments available.  PMFS History: Patient Active Problem List   Diagnosis Date Noted   Bilateral leg pain 11/11/2022   Status post total left knee replacement 05/19/2020   Status post total right knee replacement 07/12/2018   Primary osteoarthritis of left knee 04/11/2018   Unilateral primary osteoarthritis, right knee 03/07/2018   Chronic pain of right knee 03/07/2018   Chronic pain of left knee 03/07/2018   Malignant neoplasm of central portion of left female breast ([place]) 04/16/2014   Past Medical History:  Diagnosis Date   Arthralgia of knee, right    Arthritis    left knee osteoarthritis    Breast cancer ([place]) 03/26/14   Left   HTN (hypertension)    Hypothyroidism    Pre-diabetes    05/15/20 - per pt, insisted that she was not diabetic or pre diabetic. pt does not check sugars at home and has never been diagnosed    Family History  Problem Relation Age of Onset   Colon cancer Father 69   Colon cancer Paternal Uncle     Past Surgical History:  Procedure Laterality Date   BREAST IMPLANT EXCHANGE Bilateral 05/03/2018   MASTECTOMY Bilateral 2015   with reconstruction.    TONSILLECTOMY     TOTAL KNEE ARTHROPLASTY Right 05/29/2018   TOTAL KNEE ARTHROPLASTY Right 05/29/2018   Procedure: RIGHT  TOTAL KNEE ARTHROPLASTY;  Surgeon: [name], MD;  Location: [place];   Service: Orthopedics;  Laterality: Right;   TOTAL KNEE ARTHROPLASTY Left 05/19/2020   Procedure: LEFT TOTAL KNEE ARTHROPLASTY;  Surgeon: [name], MD;  Location: [place];  Service: Orthopedics;  Laterality: Left;   Social History   Occupational History   Not on file  Tobacco Use   Smoking status: Never   Smokeless tobacco: Never  Vaping Use   Vaping Use: Never used  Substance and Sexual  Activity   Alcohol use: No   Drug use: Never   Sexual activity: Not on file

## 2022-11-15 ENCOUNTER — Ambulatory Visit: Payer: No Typology Code available for payment source | Admitting: Physician Assistant

## 2022-11-17 NOTE — Therapy (Signed)
OUTPATIENT PHYSICAL THERAPY TREATMENT NOTE   Patient Name: Kayla Miles MRN: [id number] DOB:07-Jan-1951, 71 y.o., female Today's Date: 11/18/2022  END OF SESSION:   PT End of Session - 11/18/22 0924     Visit Number 3    Number of Visits 8    Authorization Type [facility]    Authorization Time Period 10/11/2022 - 04/09/2023    Progress Note Due on Visit 8    PT Start Time 0930    PT Stop Time 1010    PT Time Calculation (min) 40 min    Activity Tolerance Patient tolerated treatment well    Behavior During Therapy [facility] for tasks assessed/performed             Past Medical History:  Diagnosis Date   Arthralgia of knee, right    Arthritis    left knee osteoarthritis    Breast cancer ([place]) 03/26/14   Left   HTN (hypertension)    Hypothyroidism    Pre-diabetes    05/15/20 - per pt, insisted that she was not diabetic or pre diabetic. pt does not check sugars at home and has never been diagnosed   Past Surgical History:  Procedure Laterality Date   BREAST IMPLANT EXCHANGE Bilateral 05/03/2018   MASTECTOMY Bilateral 2015   with reconstruction.    TONSILLECTOMY     TOTAL KNEE ARTHROPLASTY Right 05/29/2018   TOTAL KNEE ARTHROPLASTY Right 05/29/2018   Procedure: RIGHT  TOTAL KNEE ARTHROPLASTY;  Surgeon: [name], MD;  Location: [place];  Service: Orthopedics;  Laterality: Right;   TOTAL KNEE ARTHROPLASTY Left 05/19/2020   Procedure: LEFT TOTAL KNEE ARTHROPLASTY;  Surgeon: [name], MD;  Location: [place];  Service: Orthopedics;  Laterality: Left;   Patient Active Problem List   Diagnosis Date Noted   Bilateral leg pain 11/11/2022   Status post total left knee replacement 05/19/2020   Status post total right knee replacement 07/12/2018   Primary osteoarthritis of left knee 04/11/2018   Unilateral primary osteoarthritis, right knee 03/07/2018   Chronic pain of right knee 03/07/2018   Chronic pain of left knee 03/07/2018   Malignant neoplasm of  central portion of left female breast ([place]) 04/16/2014     THERAPY DIAG:  Difficulty in walking, not elsewhere classified  Muscle weakness (generalized)  Stiffness of left knee, not elsewhere classified  PCP: Henreitta Cea, MD   REFERRING PROVIDER: [name], MD   REFERRING DIAG: Diagnosis [phone] (ICD-10-CM) - Status post total left knee replacement M25.562,G89.29 (ICD-10-CM) - Chronic pain of left knee   THERAPY DIAG:  Difficulty in walking, not elsewhere classified   Stiffness of left knee, not elsewhere classified   Muscle weakness (generalized)   Rationale for Evaluation and Treatment: Rehabilitation   ONSET DATE: June 2022   SUBJECTIVE:    SUBJECTIVE STATEMENT: Pt states that she has been taking motrin which has been helping her sensitivity in her quad. She also reports stretching daily which has been beneficial for her stiffness.    PERTINENT HISTORY: HTN, hypothyroid, pre-diabetes, Rt TKA 05/29/2018 and Lt TKA 05/19/2020 PAIN:  Are you having pain? Yes: NPRS scale: 0-2/10 Pain location: Left knee stiffness Pain description: Stiffness Aggravating factors: Squatting Relieving factors: NA   PRECAUTIONS: None   WEIGHT BEARING RESTRICTIONS: No   FALLS:  Has patient fallen in last 6 months? 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   PLOF: Independent   PATIENT GOALS: Less stiffness, better strength, less discomfort   NEXT MD VISIT: 01/02/2023   OBJECTIVE:    DIAGNOSTIC FINDINGS: See chart, normal findings   PATIENT SURVEYS:  FOTO 58 (Goal 83 in 8 visits)   COGNITION: Overall cognitive status: Within functional limits for tasks assessed                         SENSATION: No complaints of peripheral pain or paresthesias   EDEMA:  Mild Bil     LOWER EXTREMITY ROM:    Active ROM Right eval Left eval  Hip flexion      Hip extension      Hip abduction      Hip adduction      Hip internal rotation      Hip external rotation      Knee flexion 108 102  Knee extension 0 0  Ankle dorsiflexion      Ankle plantarflexion      Ankle inversion      Ankle eversion       (Blank rows = not tested)   LOWER EXTREMITY MMT:   MMT Right eval Left eval  Hip flexion      Hip extension      Hip abduction      Hip adduction      Hip internal rotation      Hip external rotation      Knee flexion      Knee extension 61.6 pounds 56.7 pounds  Ankle dorsiflexion      Ankle plantarflexion      Ankle inversion      Ankle eversion       (Blank rows = not tested)   GAIT: Distance walked: In the office Assistive device utilized: None Level of assistance: Complete Independence Comments: No deviations noted.  Tyrell is having problems with squatting and flexion.     TODAY'S TREATMENT:           DATE: 11/18/2022 Nustep lvl 3, 6 min Tailgate knee flexion 1 minute Seated PROM into flexion with OP STM to adductors due to increased tension and tenderness.  HS stretch  Butterfly stretch with OP from PT Supine SLR 2x10  Bridges 2x10  Supine clams 2x10 with green theraband  Sit to stand 10x from low table                DATE: 11/11/2022 Nustep lvl 3, 5 min Tailgate knee flexion 1 minute Seated PROM into flexion with OP STM to assess tissues.  HS stretch  IT band stretch  Majority of session spent on discussing hypersensitivity with two PT's. Pt is very concerned so was taken downstairs to follow up with a PA.                                                                                                          DATE: 11/02/2022 Tailgate knee flexion 1 minute Seated knee  flexion AAROM 10 x 10 seconds Prone quadriceps stretch 5 x 20 seconds left only Sit to stand with slow eccentrics 5 times     PATIENT EDUCATION:  Education details: Reviewed exam  findings and home exercise program Person educated: Patient Education method: Explanation, Demonstration, Tactile cues, Verbal cues, and Handouts Education comprehension: verbalized understanding, returned demonstration, verbal cues required, tactile cues required, and needs further education   HOME EXERCISE PROGRAM: Access Code: [email or web address] URL: [email or web address] Date: 11/02/2022 Prepared by: Vista Mink   Exercises - Seated Knee Flexion AAROM  - 3 x daily - 7 x weekly - 1 sets - 1 reps - 1 minutes hold - Prone Quadricep Stretch with Strap  - 3-5 x daily - 7 x weekly - 1 sets - 5-10 reps - 20 seconds hold - Sit to Stand with Armchair  - 5 x daily - 7 x weekly - 1 sets - 5 reps   ASSESSMENT:   CLINICAL IMPRESSION: Pt presents to PT with continued stiffness in L knee. During session, PT attempted to assess L knee mobility and soft tissue. Pt reports significant pain with light touch to her entire L and R LE. She states that she she noticed it last night when rubbing her legs. She denies any history of vascular issues or fibromyalgia. Pt was able to tolerate exercises, but majority of session was spent on discussing possible reasons for tenderness due to pt's request. Pt was taken downstairs to be seen by a PA to further diagnose new onset of hypersensitivity.    OBJECTIVE IMPAIRMENTS: Abnormal gait, decreased activity tolerance, decreased endurance, decreased knowledge of condition, difficulty walking, decreased ROM, decreased strength, increased edema, and pain.    ACTIVITY LIMITATIONS: lifting, bending, squatting, stairs, and locomotion level   PARTICIPATION LIMITATIONS: community activity and occupation   PERSONAL FACTORS:  HTN, hypothyroid, pre-diabetes, and previous bilateral knee replacements  are also affecting patient's functional outcome.    REHAB POTENTIAL: Good   CLINICAL DECISION MAKING: Stable/uncomplicated   EVALUATION COMPLEXITY: Low      GOALS: Goals reviewed with patient? Yes   SHORT TERM GOALS: Target date: 11/30/2022 Improve left knee flexion active range of motion to 110 degrees Baseline: 102 degrees Goal status: INITIAL   2.  Atalia will be independent with her day 1 home exercise program Baseline: Started 11/02/2022 Goal status: INITIAL     LONG TERM GOALS: Target date: 12/28/2022   Improved FOTO to 83 Baseline: 58 Goal status: INITIAL   2.  Improve bilateral quadriceps strength to at least 70 pounds Baseline: 56 to 62 pounds Goal status: INITIAL   3.  Maddelynn will be independent with a long-term home exercise program at discharge Baseline: Started 11/02/2022 Goal status: INITIAL   PLAN:   PT FREQUENCY: 1-2x/week   PT DURATION: 8 weeks   PLANNED INTERVENTIONS: Therapeutic exercises, Therapeutic activity, Neuromuscular re-education, Balance training, Gait training, Patient/Family education, Self Care, Stair training, Cryotherapy, Vasopneumatic device, and Manual therapy   PLAN FOR NEXT SESSION: Review home exercise program.  Emphasis on continued quadriceps strengthening and knee flexion active range of motion with functional activities like stairs and balance.  Focus on activities she can do at home as she will only be attending once a week.     Lynden Ang, PT 11/18/2022, 10:13 AM

## 2022-11-18 ENCOUNTER — Ambulatory Visit (INDEPENDENT_AMBULATORY_CARE_PROVIDER_SITE_OTHER): Payer: No Typology Code available for payment source | Admitting: Physical Therapy

## 2022-11-18 ENCOUNTER — Encounter: Payer: Self-pay | Admitting: Physical Therapy

## 2022-11-18 DIAGNOSIS — R262 Difficulty in walking, not elsewhere classified: Secondary | ICD-10-CM

## 2022-11-18 DIAGNOSIS — M25662 Stiffness of left knee, not elsewhere classified: Secondary | ICD-10-CM

## 2022-11-18 DIAGNOSIS — M6281 Muscle weakness (generalized): Secondary | ICD-10-CM

## 2022-11-25 ENCOUNTER — Encounter: Payer: No Typology Code available for payment source | Admitting: Rehabilitative and Restorative Service Providers"

## 2022-12-02 ENCOUNTER — Encounter: Payer: No Typology Code available for payment source | Admitting: Rehabilitative and Restorative Service Providers"

## 2022-12-09 ENCOUNTER — Ambulatory Visit (INDEPENDENT_AMBULATORY_CARE_PROVIDER_SITE_OTHER): Payer: No Typology Code available for payment source | Admitting: Rehabilitative and Restorative Service Providers"

## 2022-12-09 ENCOUNTER — Encounter: Payer: Self-pay | Admitting: Rehabilitative and Restorative Service Providers"

## 2022-12-09 DIAGNOSIS — M6281 Muscle weakness (generalized): Secondary | ICD-10-CM | POA: Diagnosis not present

## 2022-12-09 DIAGNOSIS — R262 Difficulty in walking, not elsewhere classified: Secondary | ICD-10-CM

## 2022-12-09 DIAGNOSIS — M25662 Stiffness of left knee, not elsewhere classified: Secondary | ICD-10-CM | POA: Diagnosis not present

## 2022-12-09 NOTE — Therapy (Signed)
OUTPATIENT PHYSICAL THERAPY TREATMENT/DISCHARGE NOTE   Patient Name: Kayla Miles MRN: [id number] DOB:13-Sep-1951, 71 y.o., female Today's Date: 12/09/2022  END OF SESSION:   PT End of Session - 12/09/22 0933     Visit Number 4    Number of Visits 8    Authorization Type [facility]    Authorization Time Period 10/11/2022 - 04/09/2023    Progress Note Due on Visit 8    PT Start Time 0932    PT Stop Time 3159    PT Time Calculation (min) 42 min    Activity Tolerance Patient tolerated treatment well;No increased pain    Behavior During Therapy WFL for tasks assessed/performed            PHYSICAL THERAPY DISCHARGE SUMMARY  Visits from Start of Care: 4  Current functional level related to goals / functional outcomes: See note   Remaining deficits: See note   Education / Equipment: HEP   Patient agrees to discharge. Patient goals were met. Patient is being discharged due to being pleased with the current functional level.    Past Medical History:  Diagnosis Date   Arthralgia of knee, right    Arthritis    left knee osteoarthritis    Breast cancer ([place]) 03/26/14   Left   HTN (hypertension)    Hypothyroidism    Pre-diabetes    05/15/20 - per pt, insisted that she was not diabetic or pre diabetic. pt does not check sugars at home and has never been diagnosed   Past Surgical History:  Procedure Laterality Date   BREAST IMPLANT EXCHANGE Bilateral 05/03/2018   MASTECTOMY Bilateral 2015   with reconstruction.    TONSILLECTOMY     TOTAL KNEE ARTHROPLASTY Right 05/29/2018   TOTAL KNEE ARTHROPLASTY Right 05/29/2018   Procedure: RIGHT  TOTAL KNEE ARTHROPLASTY;  Surgeon: [name], MD;  Location: [place];  Service: Orthopedics;  Laterality: Right;   TOTAL KNEE ARTHROPLASTY Left 05/19/2020   Procedure: LEFT TOTAL KNEE ARTHROPLASTY;  Surgeon: [name], MD;  Location: [place];  Service: Orthopedics;  Laterality: Left;   Patient Active Problem List    Diagnosis Date Noted   Bilateral leg pain 11/11/2022   Status post total left knee replacement 05/19/2020   Status post total right knee replacement 07/12/2018   Primary osteoarthritis of left knee 04/11/2018   Unilateral primary osteoarthritis, right knee 03/07/2018   Chronic pain of right knee 03/07/2018   Chronic pain of left knee 03/07/2018   Malignant neoplasm of central portion of left female breast ([place]) 04/16/2014     THERAPY DIAG:  Difficulty in walking, not elsewhere classified  Muscle weakness (generalized)  Stiffness of left knee, not elsewhere classified  PCP: Henreitta Cea, MD   REFERRING PROVIDER: [name], MD   REFERRING DIAG: Diagnosis [phone] (ICD-10-CM) - Status post total left knee replacement M25.562,G89.29 (ICD-10-CM) - Chronic pain of left knee   THERAPY DIAG:  Difficulty in walking, not elsewhere classified   Stiffness of left knee, not elsewhere classified   Muscle weakness (generalized)   Rationale for Evaluation and Treatment: Rehabilitation   ONSET DATE: June 2022   SUBJECTIVE:    SUBJECTIVE STATEMENT: [name] is taking Motrin PRN.  She is having less difficulty at work since starting PT.  She has no trouble with stairs and is doing them several times a day at work.   PERTINENT HISTORY: HTN, hypothyroid, pre-diabetes, Rt TKA 05/29/2018 and Lt TKA 05/19/2020 PAIN:  Are you having  pain? Yes: NPRS scale: 0-4/10 Pain location: Left knee stiffness Pain description: Stiffness Aggravating factors: Squatting Relieving factors: NA   PRECAUTIONS: None   WEIGHT BEARING RESTRICTIONS: No   FALLS:  Has patient fallen in last 6 months? 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   PLOF: Independent   PATIENT GOALS: Less stiffness, better strength, less discomfort   NEXT  MD VISIT: 01/02/2023   OBJECTIVE:    DIAGNOSTIC FINDINGS: See chart, normal findings   PATIENT SURVEYS:   12/09/2022 FOTO 85 (Goal met)  11/02/2022 FOTO 58 (Goal 83 in 8 visits)   COGNITION: Overall cognitive status: Within functional limits for tasks assessed                         SENSATION: No complaints of peripheral pain or paresthesias   EDEMA:  Mild Bil     LOWER EXTREMITY ROM:   Active ROM Right eval Left eval Left/Right 12/09/2022  Hip flexion       Hip extension       Hip abduction       Hip adduction       Hip internal rotation       Hip external rotation       Knee flexion 108 102 104/112  Knee extension 0 0 0/0  Ankle dorsiflexion       Ankle plantarflexion       Ankle inversion       Ankle eversion        (Blank rows = not tested)   LOWER EXTREMITY MMT:   MMT Right eval Left eval Left/Right 12/09/2022  Hip flexion       Hip extension       Hip abduction       Hip adduction       Hip internal rotation       Hip external rotation       Knee flexion       Knee extension 61.6 pounds 56.7 pounds 66.3/79.8 pounds  Ankle dorsiflexion       Ankle plantarflexion       Ankle inversion       Ankle eversion        (Blank rows = not tested)   GAIT: Distance walked: In the office Assistive device utilized: None Level of assistance: Complete Independence Comments: No deviations noted.  Kriti is having problems with squatting and flexion.     TODAY'S TREATMENT:           DATE:  12/09/2022 Tailgate knee flexion 1 minute Seated knee flexion AAROM 10 x 10 seconds Prone quadriceps stretch 5 x 20 seconds Bil Sit to stand with slow eccentrics 5 times  Functional Activities: Practical FOTO and reassessment with updating and discussion of home exercise program   11/18/2022 Nustep lvl 3, 6 min Tailgate knee flexion 1 minute Seated PROM into flexion with OP STM to adductors due to increased tension and tenderness.  HS stretch  Butterfly  stretch with OP from PT Supine SLR 2x10  Bridges 2x10  Supine clams 2x10 with green theraband  Sit to stand 10x from low table                 DATE: 11/11/2022 Nustep lvl 3, 5 min Tailgate knee flexion 1 minute Seated PROM  into flexion with OP STM to assess tissues.  HS stretch  IT band stretch  Majority of session spent on discussing hypersensitivity with two PT's. Pt is very concerned so was taken downstairs to follow up with a PA.                                                                                                          PATIENT EDUCATION:  Education details: Reviewed exam findings and home exercise program Person educated: Patient Education method: Explanation, Demonstration, Tactile cues, Verbal cues, and Handouts Education comprehension: verbalized understanding, returned demonstration, verbal cues required, tactile cues required, and needs further education   HOME EXERCISE PROGRAM: Access Code: [email or web address] URL: [email or web address] Date: 12/09/2022 Prepared by: Vista Mink  Exercises - Seated Knee Flexion AAROM  - 3 x daily - 1 x weekly - 1 sets - 1 reps - 1 minutes hold - Prone Quadricep Stretch with Strap  - 1 x daily - 7 x weekly - 1 sets - 5 reps - 20 seconds hold - Sit to Stand with Armchair  - 3 x daily - 7 x weekly - 1 sets - 5 reps    ASSESSMENT:   CLINICAL IMPRESSION: Dian has met most long-term goals established at evaluation.  She admits she has not been as compliant with one of her home exercise program exercises which is addressing her left knee flexion AROM.  With returning to the prescribed rehabilitation schedule, she is expected to meet this long-term goal and should continue with her excellent functional progress.  Dylann was given the option of 1 additional follow-up or transfer into independent rehabilitation.  She chooses to transfer into independent rehabilitation and is welcome to return if requested.    OBJECTIVE  IMPAIRMENTS: Abnormal gait, decreased activity tolerance, decreased endurance, decreased knowledge of condition, difficulty walking, decreased ROM, decreased strength, increased edema, and pain.    ACTIVITY LIMITATIONS: lifting, bending, squatting, stairs, and locomotion level   PARTICIPATION LIMITATIONS: community activity and occupation   PERSONAL FACTORS:  HTN, hypothyroid, pre-diabetes, and previous bilateral knee replacements  are also affecting patient's functional outcome.    REHAB POTENTIAL: Good   CLINICAL DECISION MAKING: Stable/uncomplicated   EVALUATION COMPLEXITY: Low     GOALS: Goals reviewed with patient? Yes   SHORT TERM GOALS: Target date: 11/30/2022 Improve left knee flexion active range of motion to 110 degrees Baseline: 102 degrees Goal status: On Going 12/09/2022   2.  Deborrah will be independent with her day 1 home exercise program Baseline: Started 11/02/2022 Goal status: Met 12/09/2022     LONG TERM GOALS: Target date: 12/28/2022   Improved FOTO to 83 Baseline: 58 Goal status: Met 12/09/2022   2.  Improve bilateral quadriceps strength to at least 70 pounds Baseline: 56 to 62 pounds Goal status: Partially Met 12/09/2022   3.  Angelica will be independent with a long-term home exercise program at discharge Baseline: Started 11/02/2022 Goal status: Met 12/09/2022   PLAN:   PT FREQUENCY: DC   PT DURATION: DC   PLANNED INTERVENTIONS: Therapeutic exercises,  Therapeutic activity, Neuromuscular re-education, Balance training, Gait training, Patient/Family education, Self Care, Stair training, Cryotherapy, Vasopneumatic device, and Manual therapy   PLAN FOR NEXT SESSION: DC   Farley Ly, PT, MPT 12/09/2022, 11:33 AM

## 2023-01-02 ENCOUNTER — Ambulatory Visit: Payer: No Typology Code available for payment source | Admitting: Orthopaedic Surgery

## 2023-01-25 ENCOUNTER — Ambulatory Visit: Payer: No Typology Code available for payment source | Admitting: Orthopaedic Surgery

## 2023-02-01 ENCOUNTER — Ambulatory Visit (INDEPENDENT_AMBULATORY_CARE_PROVIDER_SITE_OTHER): Payer: No Typology Code available for payment source | Admitting: Orthopaedic Surgery

## 2023-02-01 ENCOUNTER — Encounter: Payer: Self-pay | Admitting: Orthopaedic Surgery

## 2023-02-01 DIAGNOSIS — M7662 Achilles tendinitis, left leg: Secondary | ICD-10-CM

## 2023-02-01 DIAGNOSIS — Z96653 Presence of artificial knee joint, bilateral: Secondary | ICD-10-CM | POA: Diagnosis not present

## 2023-02-01 NOTE — Progress Notes (Signed)
The patient is well-known to me.  She has a history of knee replacements.  The right knee was replaced in June 2019 and the left knee in June 2021.  She reports some stiffness in her knees and some lateral knee pain on the left knee.  We x-rayed that left knee a few months ago.  She says she is actually doing better and the knee seem to doing better with less stiffness while she is doing some exercises.  Her bigger complaint today though is pain over her left Achilles tendon and she wanted this checked out.  Her [place] Silos test is negative.  The Achilles does have pain near its insertion and her Achilles does show evidence of inflammation and tendinitis on the left side.  Remainder of foot exam is normal.  She is in high heels today.  She is an active 72 year old female.  I recommended she wear firm tennis shoes and no open back shoes for the next 4 to 6 weeks.  She is avoid high heels as well.  I did give her a Thera-Band to work on stretching exercises and recommended Voltaren gel twice daily to this area.  All questions and concerns were answered addressed.  If she does not have improvement in her symptoms she will call us back because I would then set her up for outpatient physical therapy.

## 2023-07-11 ENCOUNTER — Other Ambulatory Visit (HOSPITAL_COMMUNITY): Payer: Self-pay | Admitting: Family Medicine

## 2023-07-11 DIAGNOSIS — R2231 Localized swelling, mass and lump, right upper limb: Secondary | ICD-10-CM

## 2023-07-14 ENCOUNTER — Encounter (HOSPITAL_COMMUNITY): Payer: Self-pay

## 2023-07-14 ENCOUNTER — Ambulatory Visit (HOSPITAL_COMMUNITY): Payer: No Typology Code available for payment source | Attending: Family Medicine

## 2023-08-18 ENCOUNTER — Ambulatory Visit (HOSPITAL_COMMUNITY): Payer: No Typology Code available for payment source | Attending: Family Medicine

## 2024-03-04 ENCOUNTER — Ambulatory Visit: Payer: No Typology Code available for payment source | Admitting: Orthopaedic Surgery

## 2024-04-17 ENCOUNTER — Other Ambulatory Visit: Payer: Self-pay

## 2024-04-17 ENCOUNTER — Ambulatory Visit (INDEPENDENT_AMBULATORY_CARE_PROVIDER_SITE_OTHER): Admitting: Orthopaedic Surgery

## 2024-04-17 ENCOUNTER — Other Ambulatory Visit (INDEPENDENT_AMBULATORY_CARE_PROVIDER_SITE_OTHER)

## 2024-04-17 ENCOUNTER — Encounter: Payer: Self-pay | Admitting: Orthopaedic Surgery

## 2024-04-17 DIAGNOSIS — M25561 Pain in right knee: Secondary | ICD-10-CM

## 2024-04-17 DIAGNOSIS — M25562 Pain in left knee: Secondary | ICD-10-CM

## 2024-04-17 DIAGNOSIS — Z96653 Presence of artificial knee joint, bilateral: Secondary | ICD-10-CM

## 2024-04-17 NOTE — Progress Notes (Signed)
 The patient is a 73 year old female with who has a history of both her knees being replaced.  Her right knee was replaced in June 2019 and the left knee in June 2021 secondary to osteoarthritis of both knees.  She denies any instability with her knees but stated they do still hurt on occasion.  She says is worse with getting up and down from a seated position and during exercises.  She is still young appearing 84 and she has been working on losing weight.  Examination of both knees shows no effusion.  She has good range of motion of both knees and they feel ligamentously stable.  Of note she denies any symptoms of instability of her knees.  Standing AP and lateral both knees shows well-seated press-fit total knee arthroplasties with no evidence of loosening and in good alignment.  There is no effusion with either knee.  I do feel that she would benefit from outpatient physical therapy for strengthening of her quads and her knees and any modalities that can help her overall with decreasing her knee pain.  I will then see her back in 6 weeks to see how she is doing overall.  She agrees with this treatment plan.

## 2024-05-29 ENCOUNTER — Ambulatory Visit: Admitting: Orthopaedic Surgery

## 2024-06-12 ENCOUNTER — Other Ambulatory Visit (INDEPENDENT_AMBULATORY_CARE_PROVIDER_SITE_OTHER)

## 2024-06-12 ENCOUNTER — Ambulatory Visit (INDEPENDENT_AMBULATORY_CARE_PROVIDER_SITE_OTHER): Admitting: Orthopaedic Surgery

## 2024-06-12 DIAGNOSIS — M25552 Pain in left hip: Secondary | ICD-10-CM

## 2024-06-12 NOTE — Progress Notes (Signed)
 The patient is very well-known to me.  She is an active 73 year old female and we have replaced both her knees remotely.  A week ago she started developing some pain in her left hip and it seems to be more around the groin and the lateral aspect of the hip.  She denies any specific injury.  She says is not severe but she noticed that it is there for sure.  She denies any right hip issues.  She denies any injuries.  I can easily put her right hip through internal and external rotation with no pain or blocks rotation.  Her left hip also moves smoothly and fluidly but there is pain in the groin with rotation of the left hip.  When I have her lay on her side with her left hip up there is not a lot of pain over the trochanteric area of the left hip at all.  Again when I have her lay supine she seems to have more pain with rotation of the hip in the groin area.  Standing AP pelvis and lateral left hip does show slight joint space narrowing of both hips but they are equal.  There is no acute findings otherwise.  This does seem to be an acute hip issue with her left hip.  I recommended setting her up for a one-time ultrasound-guided intra-articular steroid injection in her left hip joint to see if this will help subside and ease off her symptoms.  This can be both diagnostic and therapeutic.  She is agreeable to having this injection performed.  Will see if we can have this done in the near future and then I would see her back in 4 weeks to see how she is doing overall.

## 2024-07-08 ENCOUNTER — Ambulatory Visit: Admitting: Orthopaedic Surgery

## 2024-10-14 ENCOUNTER — Encounter: Payer: Self-pay | Admitting: Radiology
